# Patient Record
Sex: Female | Born: 1985 | Hispanic: Yes | Marital: Married | Smoking: Never smoker
Health system: Southern US, Community
[De-identification: ages and names within clinical notes are randomized; demographics above are authoritative.]

## PROBLEM LIST (undated history)

## (undated) ENCOUNTER — Inpatient Hospital Stay (HOSPITAL_COMMUNITY): Payer: Self-pay

## (undated) DIAGNOSIS — K8020 Calculus of gallbladder without cholecystitis without obstruction: Secondary | ICD-10-CM

## (undated) DIAGNOSIS — O24419 Gestational diabetes mellitus in pregnancy, unspecified control: Secondary | ICD-10-CM

## (undated) DIAGNOSIS — E7800 Pure hypercholesterolemia, unspecified: Secondary | ICD-10-CM

## (undated) DIAGNOSIS — N289 Disorder of kidney and ureter, unspecified: Secondary | ICD-10-CM

## (undated) DIAGNOSIS — D259 Leiomyoma of uterus, unspecified: Secondary | ICD-10-CM

## (undated) DIAGNOSIS — R7303 Prediabetes: Secondary | ICD-10-CM

## (undated) HISTORY — DX: Calculus of gallbladder without cholecystitis without obstruction: K80.20

## (undated) HISTORY — DX: Gestational diabetes mellitus in pregnancy, unspecified control: O24.419

---
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
## 2005-04-24 ENCOUNTER — Emergency Department (HOSPITAL_COMMUNITY): Admission: EM | Admit: 2005-04-24 | Discharge: 2005-04-24 | Payer: Self-pay | Admitting: Emergency Medicine

## 2005-04-25 ENCOUNTER — Emergency Department (HOSPITAL_COMMUNITY): Admission: EM | Admit: 2005-04-25 | Discharge: 2005-04-25 | Payer: Self-pay | Admitting: Emergency Medicine

## 2005-06-29 ENCOUNTER — Inpatient Hospital Stay (HOSPITAL_COMMUNITY): Admission: AD | Admit: 2005-06-29 | Discharge: 2005-06-29 | Payer: Self-pay | Admitting: *Deleted

## 2005-07-01 ENCOUNTER — Inpatient Hospital Stay (HOSPITAL_COMMUNITY): Admission: AD | Admit: 2005-07-01 | Discharge: 2005-07-01 | Payer: Self-pay | Admitting: Gynecology

## 2005-07-08 ENCOUNTER — Inpatient Hospital Stay (HOSPITAL_COMMUNITY): Admission: AD | Admit: 2005-07-08 | Discharge: 2005-07-08 | Payer: Self-pay | Admitting: *Deleted

## 2006-02-11 DIAGNOSIS — N289 Disorder of kidney and ureter, unspecified: Secondary | ICD-10-CM

## 2006-02-11 HISTORY — DX: Disorder of kidney and ureter, unspecified: N28.9

## 2006-03-04 ENCOUNTER — Encounter (INDEPENDENT_AMBULATORY_CARE_PROVIDER_SITE_OTHER): Payer: Self-pay | Admitting: Internal Medicine

## 2006-03-04 ENCOUNTER — Ambulatory Visit: Payer: Self-pay | Admitting: Internal Medicine

## 2006-03-06 ENCOUNTER — Ambulatory Visit: Payer: Self-pay | Admitting: *Deleted

## 2006-03-25 ENCOUNTER — Ambulatory Visit: Payer: Self-pay | Admitting: Internal Medicine

## 2006-04-08 ENCOUNTER — Ambulatory Visit: Payer: Self-pay | Admitting: Family Medicine

## 2006-05-15 ENCOUNTER — Ambulatory Visit: Payer: Self-pay | Admitting: Internal Medicine

## 2006-07-13 ENCOUNTER — Inpatient Hospital Stay (HOSPITAL_COMMUNITY): Admission: AD | Admit: 2006-07-13 | Discharge: 2006-07-13 | Payer: Self-pay | Admitting: Obstetrics & Gynecology

## 2006-10-29 ENCOUNTER — Encounter (INDEPENDENT_AMBULATORY_CARE_PROVIDER_SITE_OTHER): Payer: Self-pay | Admitting: *Deleted

## 2006-12-26 ENCOUNTER — Inpatient Hospital Stay (HOSPITAL_COMMUNITY): Admission: AD | Admit: 2006-12-26 | Discharge: 2006-12-30 | Payer: Self-pay | Admitting: Obstetrics & Gynecology

## 2007-10-03 IMAGING — US US OB LIMITED
1 series · 14 of 28 positions shown · non-contrast
Comparison: NONE

CLINICAL DATA: Routine anatomic evaluation. 

OBSTETRICAL ULTRASOUND

[Series 1: us obc · 0.21mm/px · 14 of 59 slices shown]
[im 3/59]
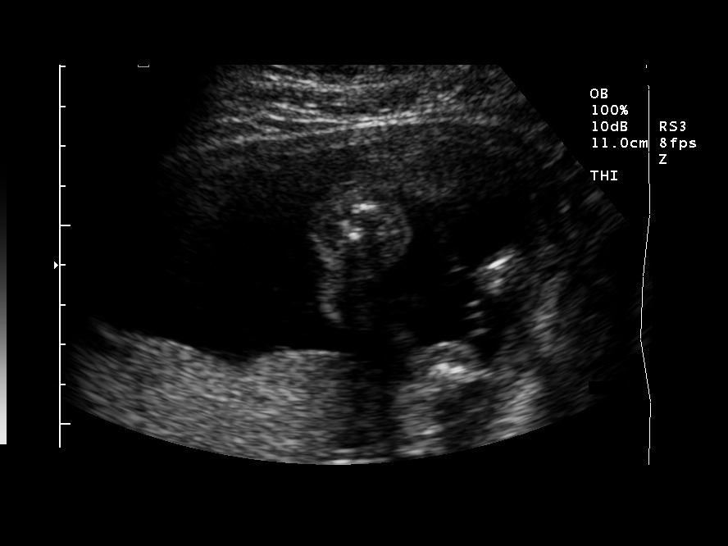
[im 7/59]
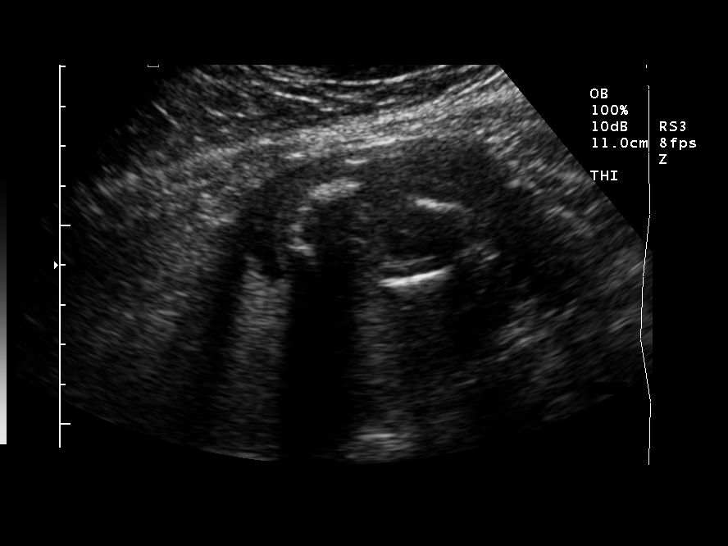
[im 11/59]
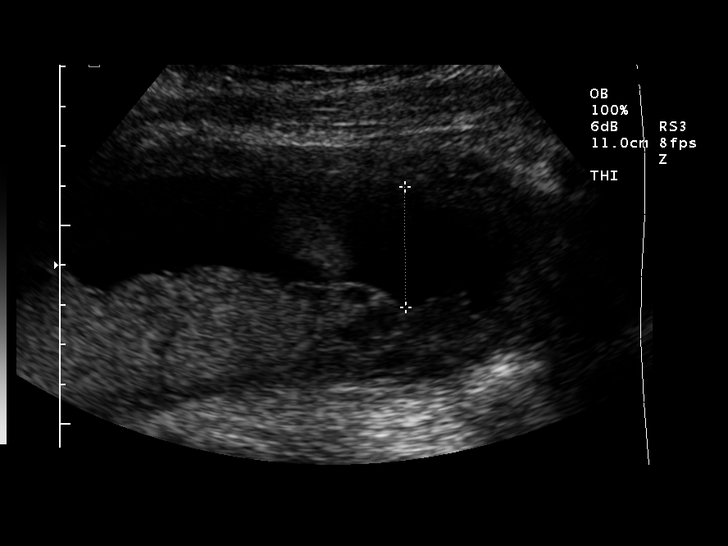
[im 16/59]
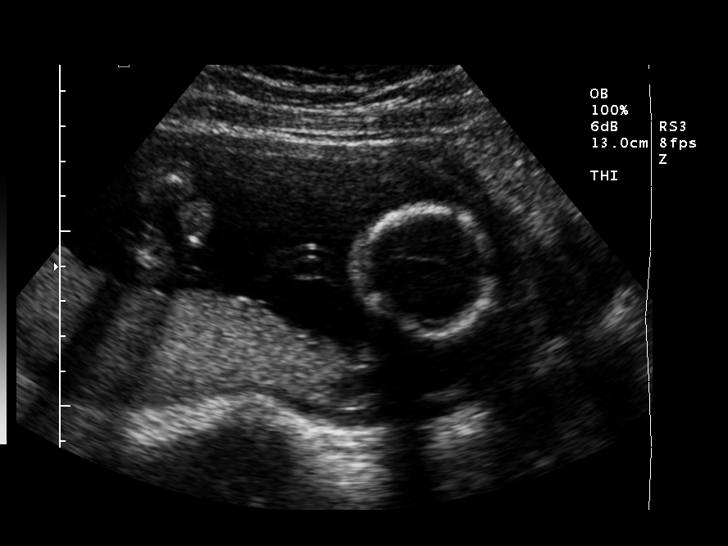
[im 20/59]
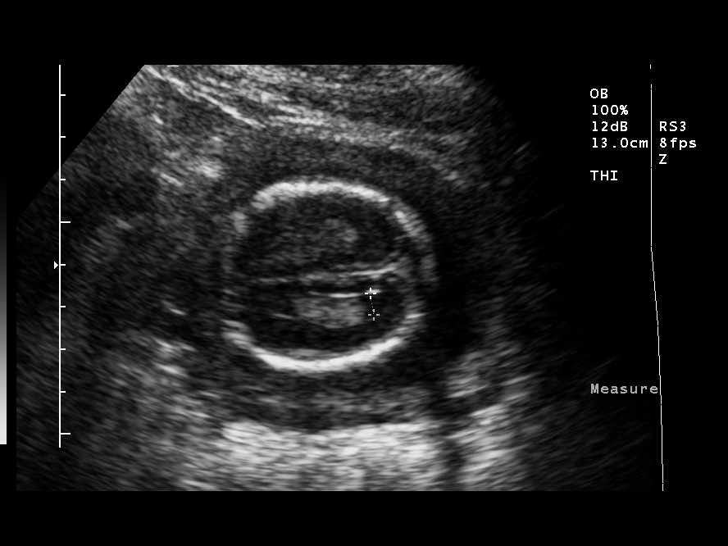
[im 24/59]
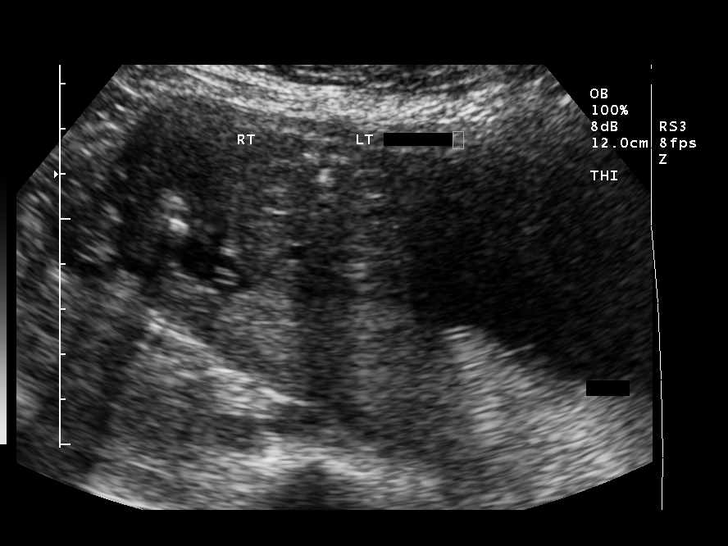
[im 28/59]
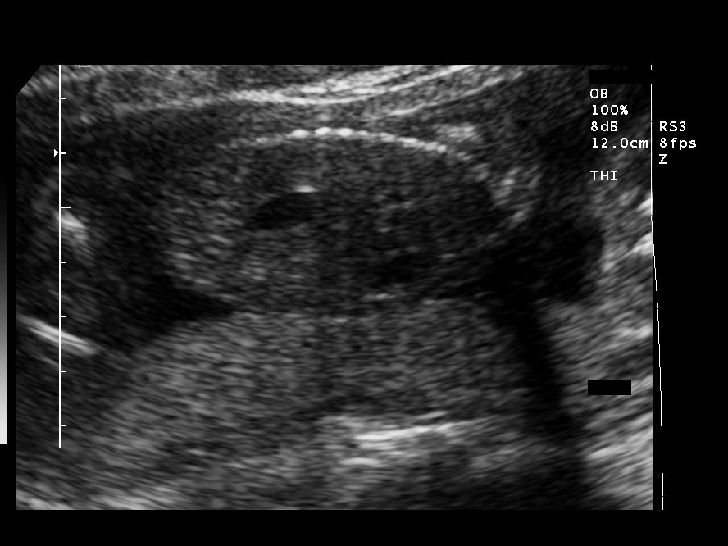
[im 33/59]
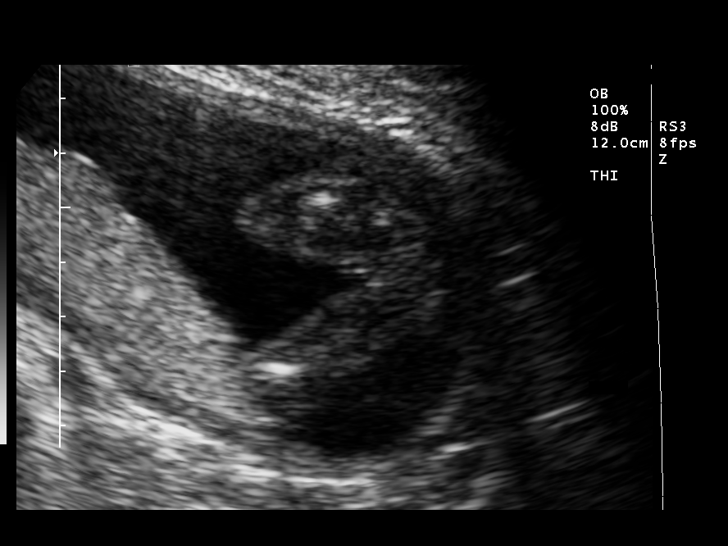
[im 37/59]
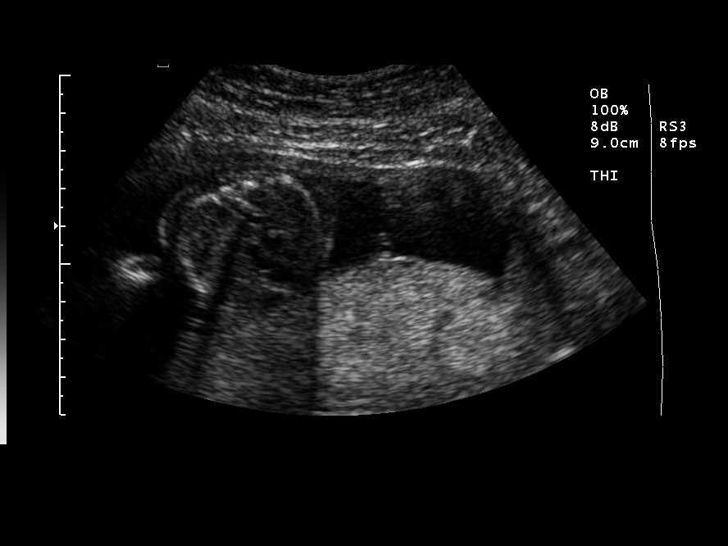
[im 41/59]
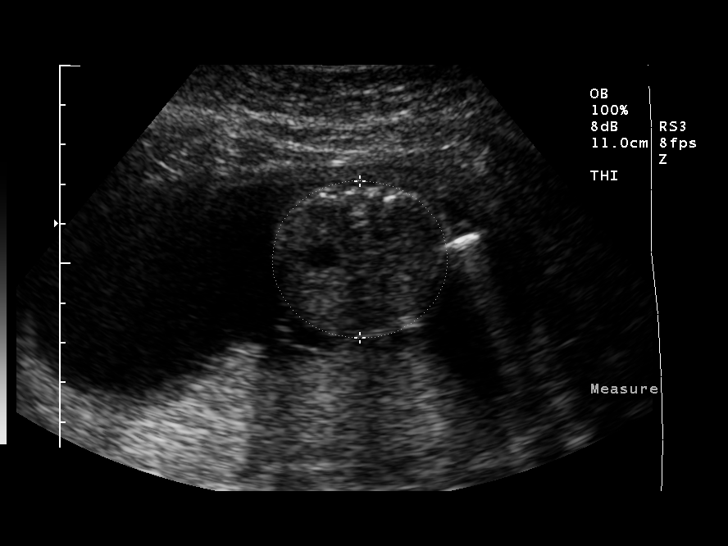
[im 46/59]
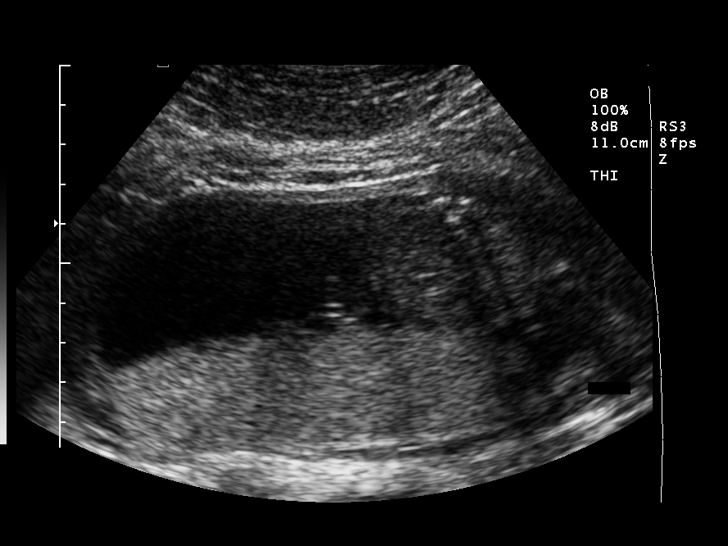
[im 50/59]
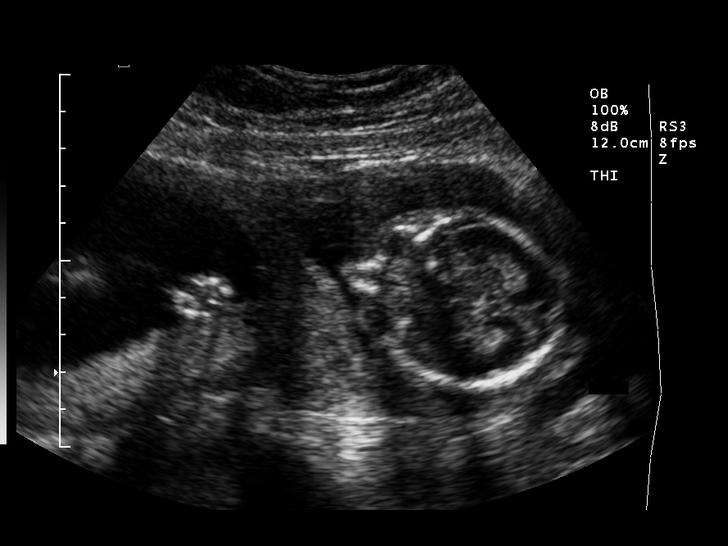
[im 54/59]
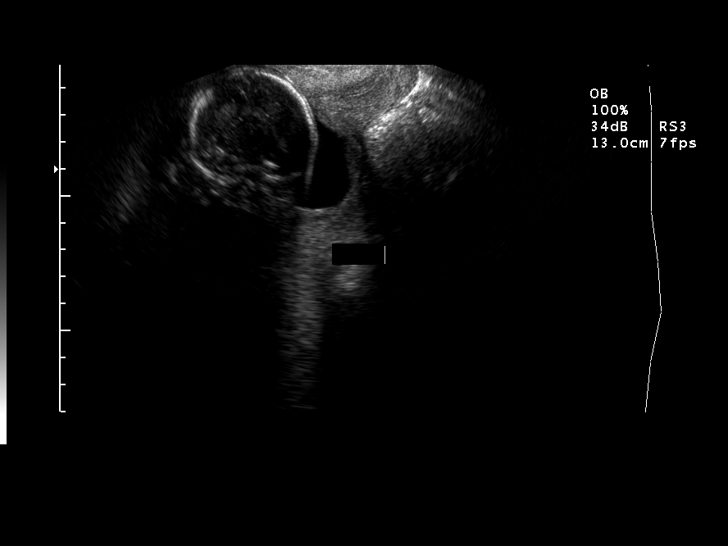
[im 59/59]
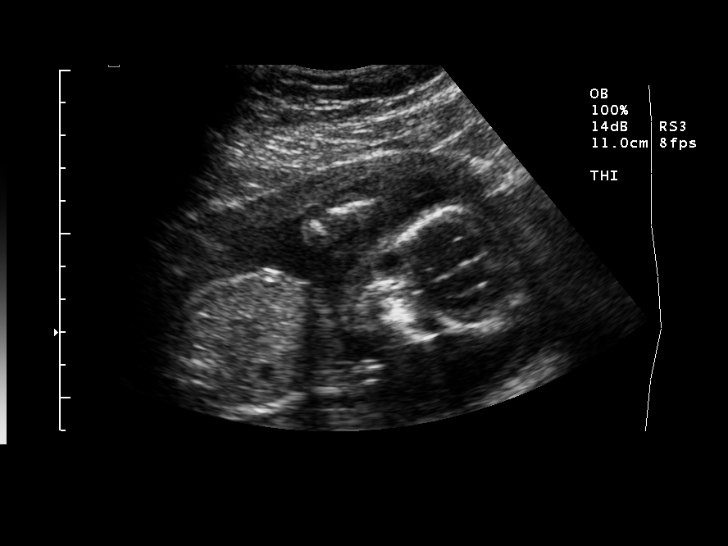

[14 of 28 positions shown; findings below may reference images not displayed]

FINDINGS: There is no previous ultrasound for comparison. There 
is a single intrauterine fetus. Fetal heartbeat is detected with a 
rate of 142 BPM. The fetus is in vertex presentation. The cord 
insertion, spine, kidneys, amniotic fluid, cisterna magna, 
cerebellum, and lateral ventricles are within normal limits. The 
4-chamber heart view appears to be within normal limits. The 
3-vessel cord view was seen. The cervix measures 3.5 cm in length 
and is closed. The placenta is approximately 4.2 cm from the 
internal cervical os. EDC: By US  01/08/07  By LMP  01/04/07 GEST. 
AGE: By US  18 weeks, 6 days   By LMP  19 weeks, 3 days HC:     
162mm = 19 weeks, 0 days AC:     130 mm = 18 weeks, 4 days BPD:    
43  mm = 19 weeks, 1 day FL:        29 mm = 18 weeks, 6 days FETAL 
WEIGHT:  254 gm / 1 pound, 9 ounces
IMPRESSION: Single living intrauterine fetus. Estimated 
gestational age based on the current measurements is 18 weeks, 6 
days. Normal fetal sonograph with no anatomic abnormalities. [place] 
08/13/2006  Trans Date: 08/14/2006 JH  [facility]

## 2007-10-14 ENCOUNTER — Ambulatory Visit: Payer: Self-pay | Admitting: Family Medicine

## 2007-10-14 DIAGNOSIS — K219 Gastro-esophageal reflux disease without esophagitis: Secondary | ICD-10-CM

## 2008-02-12 DIAGNOSIS — O24419 Gestational diabetes mellitus in pregnancy, unspecified control: Secondary | ICD-10-CM

## 2008-02-12 HISTORY — DX: Gestational diabetes mellitus in pregnancy, unspecified control: O24.419

## 2008-08-01 ENCOUNTER — Inpatient Hospital Stay (HOSPITAL_COMMUNITY): Admission: AD | Admit: 2008-08-01 | Discharge: 2008-08-03 | Payer: Self-pay | Admitting: Obstetrics & Gynecology

## 2010-02-11 HISTORY — PX: EYE SURGERY: SHX253

## 2010-05-21 LAB — CBC
Platelets: 192 10*3/uL (ref 150–400)
RBC: 3.37 MIL/uL — ABNORMAL LOW (ref 3.87–5.11)
WBC: 13.6 10*3/uL — ABNORMAL HIGH (ref 4.0–10.5)
WBC: 19.7 10*3/uL — ABNORMAL HIGH (ref 4.0–10.5)

## 2010-05-21 LAB — RPR: RPR Ser Ql: NONREACTIVE

## 2010-05-21 LAB — GLUCOSE, CAPILLARY
Glucose-Capillary: 47 mg/dL — ABNORMAL LOW (ref 70–99)
Glucose-Capillary: 78 mg/dL (ref 70–99)

## 2010-06-26 NOTE — Op Note (Signed)
NAME:  Vanessa Henson, Vanessa Henson       ACCOUNT NO.:  [id number]   MEDICAL RECORD NO.:  [id number]          PATIENT TYPE:  INP   LOCATION:  9110                          [facility]   PHYSICIAN:  Roseanna Rainbow, M.D.DATE OF BIRTH:  13-Sep-1985   DATE OF PROCEDURE:  12/27/2006  DATE OF DISCHARGE:                               OPERATIVE REPORT   PREOPERATIVE DIAGNOSES:  1. Intrauterine pregnancy at term.  2. Arrest of descent.   POSTOPERATIVE DIAGNOSES:  1. Intrauterine pregnancy at term.  2. Arrest of descent.   PROCEDURE:  Primary low uterine flap elliptical cesarean delivery via  Pfannenstiel skin incision.   SURGEON:  Roseanna Rainbow, M.D.   ANESTHESIA:  Epidural.   ESTIMATED BLOOD LOSS:  600 mL.   COMPLICATIONS:  None.   IV FLUID AND URINE OUTPUT:  As per anesthesiology.   PROCEDURE:  The patient was taken to the operating room with an IV  running, a Foley catheter in place and an epidural catheter in situ.  She was placed in the dorsal supine position with a leftward tilt and  prepped and draped in the usual sterile fashion.  After a time out had  been completed, a Pfannenstiel skin incision was then made with a  scalpel and carried down to the underlying fascia.  The fascia was  nicked in the midline.  The fascial incision was then extended  bilaterally with curved Mayo scissors.  The superior aspect of the  fascial incision was then tented up and the underlying rectus muscles  dissected off.  The inferior aspect of the fascial incision was  manipulated in a similar fashion.  The rectus muscles were separated in  the midline.  The parietal peritoneum was tented up and entered sharply.  This incision was then extended superiorly and inferiorly with good  visualization of the bladder.  An Alexis retractor was then placed into  the incision.  The vesicouterine peritoneum was tented up and entered  sharply.  This incision was then extended bilaterally and a  bladder flap  created bluntly.  A bladder blade was then placed.  The lower uterine  segment was then incised in a transverse fashion with the scalpel.  This  incision was then extended bluntly.  With assistance from below with  elevation of the infant's head, the infant's head was delivered  atraumatically.  The oropharynx was suctioned with bulb suction.  The  cord was clamped and cut.  The infant was handed off to the awaiting  neonatologist.  The placenta was then removed.  The intrauterine cavity  was evacuated of any remaining amniotic fluid, clots and debris with a  moistened laparotomy sponge.  The uterine incision was then  reapproximated in a running and a locking fashion using 0 Monocryl.  A  second imbricating layer of the same suture was then placed.  Adequate  hemostasis was noted.  The paracolic gutters were then irrigated.  The  retractor was then removed from the abdomen.  The parietal peritoneum  was reapproximated in a running fashion using 2-0 Vicryl.  The fascia was closed in a running fashion using O Vicryl.  The skin was  closed with staples.  At the close of the procedure, the instrument and  pack counts were said to be correct x2.  A gram of cephazolin had been  given at cord clamp.  The patient was taken to the PACU awake and in  stable condition.      Roseanna Rainbow, M.D.  Electronically Signed     LAJ/MEDQ  D:  12/27/2006  T:  12/29/2006  Job:  [id number]

## 2010-06-26 NOTE — H&P (Signed)
NAME:  Vanessa Henson, Vanessa Henson       ACCOUNT NO.:  [id number]   MEDICAL RECORD NO.:  [id number]          PATIENT TYPE:  INP   LOCATION:  9110                          [facility]   PHYSICIAN:  Roseanna Rainbow, M.D.DATE OF BIRTH:  [birth year]   DATE OF ADMISSION:  12/26/2006  DATE OF DISCHARGE:                              HISTORY & PHYSICAL   CHIEF COMPLAINT:  The patient is a 25 year old gravida 1, para 0 with an  estimated date of confinement of January 09, 2007 with an intrauterine  pregnancy at 38+ weeks complaining of rupture of membranes and uterine  contractions.   HISTORY OF PRESENT ILLNESS:  Please see the above.   ALLERGIES:  QUESTIONABLE ADVERSE REACTION TO BENADRYL.   MEDICATIONS:  Prenatal vitamins, prenatal labs, urine culture and  sensitivity insignificant growth.  Chlamydia probe negative, GC probe  negative, 1 hour glucose tolerance test 121, group B Streptococcus  negative on November 3, hepatitis B surface antigen negative, hematocrit  34, hemoglobin 11.1, human immunodeficiency virus nonreactive, platelets  326,000, blood type O+, antibody screen negative, Rapid Plasma Reagin  nonreactive, rubella immune, sickle cell negative.  An ultrasound on  July 2 at 19 weeks 3 days normal.  No previa.   PAST GYN HISTORY:  Noncontributory.   PAST MEDICAL HISTORY:  No significant history of medical diseases.   PAST SURGICAL HISTORY:  No previous surgery.   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  Does  not give any significant history of alcohol usage, has no significant  smoking history.  Denies illicit drug use.   FAMILY HISTORY:  Adult onset diabetes.   PHYSICAL EXAMINATION:  VITAL SIGNS:  Stable, afebrile.  Fetal heart  tracing reassuring.  Tocodynamometer contractions every 2-4 minutes.   GENERAL:  Uncomfortable.  Sterile vaginal exam per the RN cervix is one  to two and 50%.   ASSESSMENT:  Primigravida at term, early labor.   Fetal heart tracing  consistent with fetal well-being   PLAN:  Admission, expectant management.      Roseanna Rainbow, M.D.  Electronically Signed     LAJ/MEDQ  D:  12/26/2006  T:  12/27/2006  Job:  [id number]

## 2010-06-26 NOTE — H&P (Signed)
NAME:  Vanessa Henson, Vanessa Henson       ACCOUNT NO.:  [id number]   MEDICAL RECORD NO.:  [id number]          PATIENT TYPE:  INP   LOCATION:  9133                          [facility]   PHYSICIAN:  Roseanna Rainbow, M.D.DATE OF BIRTH:  1985-07-22   DATE OF ADMISSION:  08/01/2008  DATE OF DISCHARGE:                              HISTORY & PHYSICAL   CHIEF COMPLAINT:  The patient is a 25 year old, para 1, with an  estimated date of confinement of August 22, 2008, with an intrauterine  pregnancy at 37 plus weeks with history of a previous cesarean delivery  complaining of uterine contractions.   HISTORY OF PRESENT ILLNESS:  Please see the above.   ALLERGIES:  BENADRYL.   MEDICATIONS:  Prenatal vitamins.   OBSTERIC RISK FACTORS:  History of a previous cesarean delivery,  gestational diabetes diet controlled.   PRENATAL LABORATORY DATA:  Urine culture and sensitivity no growth.  Chlamydia probe negative.  Pap smear negative.  GC probe negative.  Three-hour GCT too abnormal values.  One-hour GCT 155.  Hepatitis B  surface antigen negative, hematocrit 35.3, hemoglobin 12.5.  HIV  nonreactive.  Platelets 293,000.  Blood type is O positive, antibody  screen negative, RPR nonreactive.  Sickle cell negative.   OBSTETRICAL HISTORY:  In November 2008, she was delivered of a female at  term, birth weight 7 pounds 9 ounces via cesarean delivery.   PAST GYNECOLOGIC HISTORY:  Noncontributory.   PAST MEDICAL HISTORY:  No significant history of medical diseases.   PAST SURGICAL HISTORY:  Please see the above.   SOCIAL HISTORY:  She lives in [employer].  She is married, living with  her spouse.  Does not give any significant history of alcohol usage.  Has no significant smoking history.  Denies illicit drug use.   FAMILY HISTORY:  Noncontributory.   PHYSICAL EXAMINATION:  VITAL SIGNS:  Stable, afebrile.  Fetal heart  tracing reassuring.  PELVIC:  Tocodynamometer, uterine contractions every  2-4 minutes.  CBG  46.  GENERAL:  Moderate distress.  ABDOMEN:  Gravid.  Sterile vaginal exam per the RN, the cervix is 4 cm  dilated and 100% effaced.   ASSESSMENT:  Primipara at 37 weeks, history of a previous cesarean  delivery, desires vaginal birth after cesarean, history of gestational  diabetes diet controlled.  Early labor, fetal heart tracing consistent  with fetal well-being.  Unknown group B streptococcus status.   PLAN:  Admission, epidural expectant management.      Roseanna Rainbow, M.D.  Electronically Signed     LAJ/MEDQ  D:  08/01/2008  T:  08/01/2008  Job:  [id number]

## 2010-06-26 NOTE — Discharge Summary (Signed)
NAME:  Vanessa Henson, Vanessa Henson       ACCOUNT NO.:  [id number]   MEDICAL RECORD NO.:  [id number]          PATIENT TYPE:  INP   LOCATION:  9110                          [facility]   PHYSICIAN:  Charles A. Clearance Coots, M.D.DATE OF BIRTH:  09/05/1985   DATE OF ADMISSION:  12/26/2006  DATE OF DISCHARGE:  12/30/2006                               DISCHARGE SUMMARY   ADMITTING DIAGNOSES:  1. Term pregnancy.  2. Spontaneous rupture of membranes.  3. Uterine contractions.   DISCHARGE DIAGNOSES:  1. Term pregnancy.  2. Spontaneous rupture of membranes.  3. Uterine contractions.  4. Status post primary low transverse cesarean section on December 27, 2006, viable female, delivered at 10:37.  Apgars of 9 at 1 minute      and 9 at 5 minutes, weight of 3450 grams, length of 49.5 cm.  5. Anemia, clinically stable and did not require transfusion of blood      products.   CONDITION ON DISCHARGE:  Mother discharged home in good condition   REASON FOR ADMISSION:  A 25 year old G1 Hispanic female estimated date  of confinement of January 09, 2007, presented with leaking of fluid and  uterine contractions.   PAST MEDICAL HISTORY:  1. Surgery none.  2. Illnesses none.   MEDICATIONS:  Prenatal vitamins.   ALLERGIES:  NO KNOWN DRUG ALLERGIES.   SOCIAL HISTORY:  Married, living with spouse.  A homemaker.  Negative  history of tobacco, alcohol or recreational drug use.   FAMILY HISTORY:  Positive for diabetes.   GYNECOLOGIC HISTORY:  No major gynecologic illnesses.  No history of  STDs.   PHYSICAL EXAMINATION:  GENERAL:  Well-nourished, well-developed female  in no acute distress.  VITAL SIGNS:  Temperature 98.5, pulse 82, respiratory rate 20, blood  pressure 129/81.  LUNGS:  Clear to auscultation bilaterally.  HEART:  Regular rate and rhythm.  ABDOMEN:  Gravid, nontender.  Cervix 2 cm dilated, 50% effaced, vertex  presentation.   ADMITTING LABORATORY VALUES:  Hemoglobin 12, hematocrit  34.6, white  blood cell count 11,400, platelets 192,000, RPR was nonreactive.   HOSPITAL COURSE:  The patient was admitted and progressed to full  dilatation but with increased caput and the vertex had descended to no  more than +2 station with no further descent.  The patient made no  further descent of the vertex, and a decision was made to proceed with  cesarean section delivery for arrest of descent.  Primary low transverse  cesarean section was performed on December 27, 2006.  There were no  intraoperative complications.  Postoperative course was uncomplicated.  The patient was discharged home on postop day #3 in good condition.   DISCHARGE MEDICATIONS:  1. Tylox and ibuprofen were prescribed for pain.  2. Continue prenatal vitamins.  3. Iron was prescribed for anemia.   DISCHARGE INSTRUCTIONS:  Routine written instructions were given for  discharge after cesarean section.  The patient is to call office for  follow-up appointment in 2 weeks      Charles A. Clearance Coots, M.D.  Electronically Signed     CAH/MEDQ  D:  12/30/2006  T:  12/30/2006  Job:  [id number]

## 2010-11-20 LAB — CBC
HCT: 23 — ABNORMAL LOW
HCT: 34.6 — ABNORMAL LOW
Hemoglobin: 12
MCV: 89.1
MCV: 89
Platelets: 192
RBC: 2.58 — ABNORMAL LOW
RDW: 12.9
WBC: 17.6 — ABNORMAL HIGH

## 2010-11-29 LAB — COMPREHENSIVE METABOLIC PANEL
AST: 36
BUN: 4 — ABNORMAL LOW
CO2: 26
Calcium: 9.1
Chloride: 105
Creatinine, Ser: 0.52
GFR calc Af Amer: >60
GFR calc non Af Amer: >60
Glucose, Bld: 90
Total Bilirubin: 0.5

## 2010-11-29 LAB — URINALYSIS, ROUTINE W REFLEX MICROSCOPIC
Bilirubin Urine: NEGATIVE
Glucose, UA: NEGATIVE
Hgb urine dipstick: NEGATIVE
Specific Gravity, Urine: 1.01
Urobilinogen, UA: 0.2

## 2010-11-29 LAB — CBC
HCT: 33.9 — ABNORMAL LOW
MCHC: 34.5
MCV: 88
RBC: 3.85 — ABNORMAL LOW

## 2011-05-20 ENCOUNTER — Encounter (HOSPITAL_COMMUNITY): Payer: Self-pay | Admitting: Emergency Medicine

## 2011-05-20 ENCOUNTER — Emergency Department (HOSPITAL_COMMUNITY)
Admission: EM | Admit: 2011-05-20 | Discharge: 2011-05-20 | Disposition: A | Discharge status: Home / Self Care | Payer: Self-pay | Attending: Emergency Medicine | Admitting: Emergency Medicine

## 2011-05-20 ENCOUNTER — Emergency Department (HOSPITAL_COMMUNITY): Payer: Self-pay

## 2011-05-20 DIAGNOSIS — D72829 Elevated white blood cell count, unspecified: Secondary | ICD-10-CM | POA: Insufficient documentation

## 2011-05-20 DIAGNOSIS — R1011 Right upper quadrant pain: Secondary | ICD-10-CM | POA: Insufficient documentation

## 2011-05-20 DIAGNOSIS — R197 Diarrhea, unspecified: Secondary | ICD-10-CM | POA: Insufficient documentation

## 2011-05-20 LAB — CBC
HCT: 37 % (ref 36.0–46.0)
Hemoglobin: 12.6 g/dL (ref 12.0–15.0)
MCHC: 34.1 g/dL (ref 30.0–36.0)
MCV: 88.3 fL (ref 78.0–100.0)
RDW: 12.5 % (ref 11.5–15.5)
WBC: 11 10*3/uL — ABNORMAL HIGH (ref 4.0–10.5)

## 2011-05-20 LAB — URINALYSIS, ROUTINE W REFLEX MICROSCOPIC
Nitrite: NEGATIVE
Specific Gravity, Urine: 1.019 (ref 1.005–1.030)
Urobilinogen, UA: 1 mg/dL (ref 0.0–1.0)
pH: 6 (ref 5.0–8.0)

## 2011-05-20 LAB — DIFFERENTIAL
Basophils Absolute: 0 10*3/uL (ref 0.0–0.1)
Eosinophils Relative: 2 % (ref 0–5)
Lymphocytes Relative: 19 % (ref 12–46)
Monocytes Absolute: 0.6 10*3/uL (ref 0.1–1.0)
Monocytes Relative: 5 % (ref 3–12)

## 2011-05-20 LAB — URINE MICROSCOPIC-ADD ON

## 2011-05-20 LAB — POCT I-STAT, CHEM 8
BUN: 8 mg/dL (ref 6–23)
Calcium, Ion: 1.17 mmol/L (ref 1.12–1.32)
Glucose, Bld: 95 mg/dL (ref 70–99)
TCO2: 25 mmol/L (ref 0–100)

## 2011-05-20 LAB — POCT PREGNANCY, URINE: Preg Test, Ur: NEGATIVE

## 2011-05-20 LAB — PREGNANCY, URINE: Preg Test, Ur: NEGATIVE

## 2011-05-20 MED ORDER — ONDANSETRON HCL 4 MG/2ML IJ SOLN
4.0000 mg | Freq: Once | INTRAMUSCULAR | Status: AC
  Administered 2011-05-20: 4 mg via INTRAVENOUS
  Filled 2011-05-20: qty 2

## 2011-05-20 MED ORDER — SODIUM CHLORIDE 0.9 % IV BOLUS (SEPSIS)
1000.0000 mL | Freq: Once | INTRAVENOUS | Status: AC
  Administered 2011-05-20: 1000 mL via INTRAVENOUS

## 2011-05-20 MED ORDER — MORPHINE SULFATE 4 MG/ML IJ SOLN
4.0000 mg | Freq: Once | INTRAMUSCULAR | Status: AC
  Administered 2011-05-20: 4 mg via INTRAVENOUS
  Filled 2011-05-20: qty 1

## 2011-05-20 MED ORDER — PANTOPRAZOLE SODIUM 20 MG PO TBEC: 40.0000 mg | DELAYED_RELEASE_TABLET | Freq: Every day | ORAL | Status: DC

## 2011-05-20 MED ORDER — LOPERAMIDE HCL 2 MG PO CAPS: 2.0000 mg | ORAL_CAPSULE | Freq: Four times a day (QID) | ORAL | Status: AC | PRN

## 2011-05-20 NOTE — ED Provider Notes (Signed)
History     CSN: [id number]  Arrival date & time 05/20/11  1115   First MD Initiated Contact with Patient 05/20/11 1137      Chief Complaint  Patient presents with  . Abdominal Pain    (Consider location/radiation/quality/duration/timing/severity/associated sxs/prior treatment) HPI  26 year old Hispanic female presents with a chief complaints of upper abdominal pain with diarrhea for the past 3 days. Patient is Spanish speaking, she does speak limited [place]. History was obtained mostly through her husband. Per husband, patient has been having right upper quadrant and epigastric abdominal pain for the past 3 days. Onset is gradual, persistent, worsening with eating and improves with not eating. Describe pain is sharp and stabbing. She has nonbloody diarrhea, >5 times/day. She denies any nausea vomiting. She denies fever, chest pain, shortness of breath, or new symptoms, vaginal discharge, or vaginal bleeding. Denies recent abx use or recent travel.  Does have prior history of cholelythiasis without evidence of cholecystitis back in 2008.  She has not tried anything to alleviate her symptoms. Her last menstrual period was in January, states she has a history of irregular period.    No past medical history on file.  Past Surgical History  Procedure Date  . Cesarean section     No family history on file.  History  Substance Use Topics  . Smoking status: Not on file  . Smokeless tobacco: Not on file  . Alcohol Use:     OB History    Grav Para Term Preterm Abortions TAB SAB Ect Mult Living                  Review of Systems  All other systems reviewed and are negative.    Allergies  Diphenhydramine hcl  Home Medications  No current outpatient prescriptions on file.  BP 100/62  Pulse 72  Temp(Src) 98.5 F (36.9 C) (Oral)  Resp 20  SpO2 100%  LMP 02/19/2011  Physical Exam  Nursing note and vitals reviewed. Constitutional: She appears well-developed and  well-nourished. No distress.       Awake, alert, nontoxic appearance  HENT:  Head: Atraumatic.  Eyes: Conjunctivae are normal. Right eye exhibits no discharge. Left eye exhibits no discharge.  Neck: Neck supple.  Cardiovascular: Normal rate and regular rhythm.   Pulmonary/Chest: Effort normal. No respiratory distress. She exhibits no tenderness.  Abdominal: Soft. There is no rebound and no guarding.       Diffuse tenderness to upper abdomen. Negative Murphy sign. No McBurney's point tenderness. No guarding, no rebound tenderness, no evidence of splenohepatomegaly.  No rash, no hernia  Musculoskeletal: She exhibits no tenderness.       ROM appears intact, no obvious focal weakness  Neurological: She is alert.       Mental status and motor strength appears intact  Skin: Skin is warm. No rash noted.  Psychiatric: She has a normal mood and affect.    ED Course  Procedures (including critical care time)   Labs Reviewed  URINALYSIS, ROUTINE W REFLEX MICROSCOPIC  PREGNANCY, URINE   No results found.   No diagnosis found.  Results for orders placed during the hospital encounter of 05/20/11  URINALYSIS, ROUTINE W REFLEX MICROSCOPIC      Component Value Range   Color, Urine YELLOW  YELLOW    APPearance CLEAR  CLEAR    Specific Gravity, Urine 1.019  1.005 - 1.030    pH 6.0  5.0 - 8.0    Glucose, UA NEGATIVE  NEGATIVE (  mg/dL)   Hgb urine dipstick TRACE (*) NEGATIVE    Bilirubin Urine NEGATIVE  NEGATIVE    Ketones, ur NEGATIVE  NEGATIVE (mg/dL)   Protein, ur NEGATIVE  NEGATIVE (mg/dL)   Urobilinogen, UA 1.0  0.0 - 1.0 (mg/dL)   Nitrite NEGATIVE  NEGATIVE    Leukocytes, UA SMALL (*) NEGATIVE   PREGNANCY, URINE      Component Value Range   Preg Test, Ur NEGATIVE  NEGATIVE   CBC      Component Value Range   WBC 11.0 (*) 4.0 - 10.5 (K/uL)   RBC 4.19  3.87 - 5.11 (MIL/uL)   Hemoglobin 12.6  12.0 - 15.0 (g/dL)   HCT 16.1  09.6 - 04.5 (%)   MCV 88.3  78.0 - 100.0 (fL)   MCH 30.1   26.0 - 34.0 (pg)   MCHC 34.1  30.0 - 36.0 (g/dL)   RDW 40.9  81.1 - 91.4 (%)   Platelets 245  150 - 400 (K/uL)  DIFFERENTIAL      Component Value Range   Neutrophils Relative 74  43 - 77 (%)   Neutro Abs 8.1 (*) 1.7 - 7.7 (K/uL)   Lymphocytes Relative 19  12 - 46 (%)   Lymphs Abs 2.1  0.7 - 4.0 (K/uL)   Monocytes Relative 5  3 - 12 (%)   Monocytes Absolute 0.6  0.1 - 1.0 (K/uL)   Eosinophils Relative 2  0 - 5 (%)   Eosinophils Absolute 0.2  0.0 - 0.7 (K/uL)   Basophils Relative 0  0 - 1 (%)   Basophils Absolute 0.0  0.0 - 0.1 (K/uL)  POCT I-STAT, CHEM 8      Component Value Range   Sodium 141  135 - 145 (mEq/L)   Potassium 3.8  3.5 - 5.1 (mEq/L)   Chloride 105  96 - 112 (mEq/L)   BUN 8  6 - 23 (mg/dL)   Creatinine, Ser 7.82  0.50 - 1.10 (mg/dL)   Glucose, Bld 95  70 - 99 (mg/dL)   Calcium, Ion 9.56  2.13 - 1.32 (mmol/L)   TCO2 25  0 - 100 (mmol/L)   Hemoglobin 12.6  12.0 - 15.0 (g/dL)   HCT 08.6  57.8 - 46.9 (%)  URINE MICROSCOPIC-ADD ON      Component Value Range   Squamous Epithelial / LPF FEW (*) RARE    WBC, UA 0-2  <3 (WBC/hpf)   RBC / HPF 0-2  <3 (RBC/hpf)   Bacteria, UA FEW (*) RARE    US Abdomen Complete  05/20/2011  *RADIOLOGY REPORT*  Clinical Data:  Right upper quadrant abdominal pain.  COMPLETE ABDOMINAL ULTRASOUND  Comparison:  03/17/2004 CT.  Findings:        Gallbladder:  normal, without stone, wall thickening, or       pericholecystic fluid.  Sonographic Murphy's sign was not       elicited.              Common bile duct:  normal, at 4 mm        Liver:  normal in echogenicity without focal lesion.        IVC:  within normal limits.        Pancreas:  normal.  Tail partially obscured.        Spleen:  normal in size and echotexture.        Right Kidney:  11.0 cm.  No hydronephrosis.        Left Kidney:  10.8 cm.  No hydronephrosis.  Abdominal aorta:  Non aneurysmal without ascites.  IMPRESSION: No acute process or explanation for right upper quadrant pain.  Original  Report Authenticated By: Consuello Bossier, M.D.      MDM  abd pain with diarrhea and loss of appetite, prior hx of cholelithiasis.  Will obtain Abd [place] for further evaluation.  Work up initiated.  Pain medication and antiemetic give with IVF.  Non surgical abdomen.   2:38 PM No evidence of UTI, pregnancy test negative, normal electrolytes, mildly elevated WBC of 11.0 without left shift.  Abd US reveals no acute changes.  Pt has no CP, cough or SOB concerning for pulmonary etiology.  On reexamination her abd is nontender.  Pt does a systolic BP in the 90s and does complain of dizziness.  IVF given.     4:11 PM BP improves.  Pt has negative orthostatic VS.  Is safe to be discharge.  F/u instruction given.      Fayrene Helper, PA-C 05/20/11 1612

## 2011-05-20 NOTE — ED Notes (Signed)
PA at bedside.

## 2011-05-20 NOTE — ED Notes (Signed)
Patient denies any pain,  Denies nausea, but admits to having dizziness.  Iv bolus ordered and started after talking with erpa

## 2011-05-20 NOTE — Discharge Instructions (Signed)
Take protonix for your abdominal pain.  Take Imodium for your diarrhea.  Return if your symptoms worsen or if you have other concerns.   Dolor abdominal, versin ampliada (Abdominal Pain, Nonspecific) El anlisis podra no mostrar la razn exacta por la que tiene dolor abdominal. Debido a que hay muchas causas distintas de dolor abdominal, se podr necesitar otro control y ms anlisis. Es muy importante el seguimiento para observar los sntomas duraderos (persistentes) o los que [place]. Una causa posible de dolor abdominal en cualquier persona que an tiene su apndice es la apendicitis aguda. La apendicitis es a menudo difcil de diagnosticar. Los anlisis de [place], [place], [place] y tomografa computada no pueden descartar por completo la apendicitis u otra causas de dolor abdominal. A veces, slo los cambios que se producen a Games developer del Microbiologist si el dolor abdominal se debe al apendicitis o a otras causas. Otros problemas potenciales que pueden requerir [place] tambin pueden tomar algn [employer] ser evidentes. Debido a esto, es importante seguir todas las instrucciones de ms abajo. INSTRUCCIONES PARA EL CUIDADO DOMICILIARIO  Descanse todo lo que pueda.   No ingiera alimentos slidos hasta que el dolor desaparezca.   Cuando un adulto o un nio siente dolor: Puede beneficiarlo una dieta basada en agua, t liviano descafeinado, caldo o consom, gelatina, solucin de rehidratacin oral, helados de agua o trocitos de hielo.   Cuando el adulto o el nio no sienten ms dolor: Consuma una dieta liviana (tostadas secas, crackers, jugo de [place] o arroz blanco). Incorpore ms alimentos lentamente, siempre que esto no le cause ningn trastorno. No consuma productos lcteos (incluyendo queso y [place]) ni ingiera alimentos condimentados, grasos, fritos o con gran cantidad de [place].   No consuma alcohol, cafena ni cigarrillos.   Tome sus medicamentos regularmente, excepto  que el profesional le indique lo contrario.   Utilice los medicamentos de venta libre o de prescripcin para Chief Technology Officer, Environmental health practitioner o la [place], segn se lo indique el profesional que lo asiste.   Utilice los medicamentos de venta libre o de prescripcin para Chief Technology Officer, Environmental health practitioner o la [place], segn se lo indique el profesional que lo asiste. No administre aspirina a los nios.  Si el mdico le ha dado fecha para una visita de control, es importante que concurra. No cumplir con este control puede dar como resultado que el dao, el dolor o la discapacidad sean permanentes (crnicos). Si tiene problemas para asistir al control, deber [employer] establecimiento para recibir asesoramiento.  SOLICITE ATENCIN MDICA DE INMEDIATO SI:  Usted o su nio han sufrido dolor por ms de 24 horas.   El dolor [place], cambia de Environmental consultant o se siente diferente.   Usted o su nio tienen una temperatura oral de ms de 102 F (38.9 C) y no puede ser controlada con medicamentos.   Su beb tiene ms de 3 meses y su temperatura rectal es de 102 F (38.9 C) o ms.   Su beb tiene 3 meses o menos y su temperatura rectal es de 100.4 F (38 C) o ms.   Usted o su hijo tienen escalofros.   Continan con vmitos y no pueden retener lquidos.   Observa sangre en el vmito o en la materia fecal.   Las heces son oscuras o [place].   Los movimientos intestinales son frecuentes.   Los movimientos intestinales se detienen (hay una obstruccin) o no pueden eliminarse los gases.   Siente dolor al Beatrix Shipper o lo hace  con frecuencia u observa sangre en la orina.   La piel y la zona blanca de los ojos [place] de color y se tornan amarillos.   Observa que el estmago se hincha o est ms grande.   Sienten mareos o [employer].   Sienten dolor en el pecho o la espalda.  EST SEGURO QUE:   Comprende las instrucciones para el alta mdica.   Controlar su enfermedad.   Solicitar atencin mdica de inmediato segn  las indicaciones.  Document Released: 05/07/2007 Document Revised: 01/17/2011 [facility] Patient Information 2012 [place], [place].

## 2011-05-20 NOTE — ED Notes (Addendum)
Reports Rt side abd pain starting Saturday evening, having diarrhea, no vomiting, no fevers, points to RUQ. Pain worse after eating. Also feels pressure in her back. LMP in January but hx of irregular periods.

## 2011-05-20 NOTE — ED Notes (Signed)
Pt states the pain is much worse after eating

## 2011-05-21 MED ORDER — AEROCHAMBER Z-STAT PLUS/MEDIUM MISC
Status: AC
  Filled 2011-05-21: qty 1

## 2011-05-21 MED ORDER — ALBUTEROL SULFATE (5 MG/ML) 0.5% IN NEBU
INHALATION_SOLUTION | RESPIRATORY_TRACT | Status: AC
  Filled 2011-05-21: qty 1

## 2011-05-21 MED ORDER — ALBUTEROL SULFATE HFA 108 (90 BASE) MCG/ACT IN AERS
INHALATION_SPRAY | RESPIRATORY_TRACT | Status: AC
  Filled 2011-05-21: qty 6.7

## 2011-05-21 MED ORDER — PREDNISOLONE SODIUM PHOSPHATE 15 MG/5ML PO SOLN
ORAL | Status: AC
  Filled 2011-05-21: qty 2

## 2011-05-23 NOTE — ED Provider Notes (Signed)
Medical screening examination/treatment/procedure(s) were performed by non-physician practitioner and as supervising physician I was immediately available for consultation/collaboration.  Cyndra Numbers, MD 05/23/11 1322

## 2011-07-23 ENCOUNTER — Encounter (HOSPITAL_COMMUNITY): Payer: Self-pay | Admitting: *Deleted

## 2011-07-23 ENCOUNTER — Emergency Department (HOSPITAL_COMMUNITY)
Admission: EM | Admit: 2011-07-23 | Discharge: 2011-07-24 | Disposition: A | Discharge status: Home / Self Care | Payer: Self-pay | Attending: Emergency Medicine | Admitting: Emergency Medicine

## 2011-07-23 DIAGNOSIS — M549 Dorsalgia, unspecified: Secondary | ICD-10-CM | POA: Insufficient documentation

## 2011-07-23 DIAGNOSIS — K8020 Calculus of gallbladder without cholecystitis without obstruction: Secondary | ICD-10-CM | POA: Insufficient documentation

## 2011-07-23 DIAGNOSIS — K8050 Calculus of bile duct without cholangitis or cholecystitis without obstruction: Secondary | ICD-10-CM

## 2011-07-23 HISTORY — DX: Disorder of kidney and ureter, unspecified: N28.9

## 2011-07-23 NOTE — ED Notes (Addendum)
Pt c/o sternal pain that began 3 days ago which then began radiating to her back b/w her shoulder blades.  Pt states the pain feels like a cut and like pressure.  When she lays down, the pain increases.  C/ intermittant sob and the last 2 nights she has been unable to sleep d/t the pain.  Denies recent cough.  Pt states that when she was eating Sat if felt like she couldn't swallow.

## 2011-07-24 ENCOUNTER — Emergency Department (HOSPITAL_COMMUNITY): Payer: Self-pay

## 2011-07-24 LAB — URINALYSIS, ROUTINE W REFLEX MICROSCOPIC
Leukocytes, UA: NEGATIVE
Nitrite: NEGATIVE
Specific Gravity, Urine: 1.03 (ref 1.005–1.030)
Urobilinogen, UA: 0.2 mg/dL (ref 0.0–1.0)
pH: 6 (ref 5.0–8.0)

## 2011-07-24 LAB — CBC
Hemoglobin: 12.6 g/dL (ref 12.0–15.0)
Platelets: 304 10*3/uL (ref 150–400)
RBC: 4.21 MIL/uL (ref 3.87–5.11)
WBC: 14.1 10*3/uL — ABNORMAL HIGH (ref 4.0–10.5)

## 2011-07-24 LAB — POCT PREGNANCY, URINE: Preg Test, Ur: NEGATIVE

## 2011-07-24 LAB — LIPASE, BLOOD: Lipase: 23 U/L (ref 11–59)

## 2011-07-24 LAB — COMPREHENSIVE METABOLIC PANEL
Albumin: 4.2 g/dL (ref 3.5–5.2)
Alkaline Phosphatase: 125 U/L — ABNORMAL HIGH (ref 39–117)
BUN: 16 mg/dL (ref 6–23)
Calcium: 9.3 mg/dL (ref 8.4–10.5)
Creatinine, Ser: 0.61 mg/dL (ref 0.50–1.10)
GFR calc Af Amer: >90 mL/min (ref >90)
Glucose, Bld: 77 mg/dL (ref 70–99)
Potassium: 3.5 mEq/L (ref 3.5–5.1)
Total Protein: 8.1 g/dL (ref 6.0–8.3)

## 2011-07-24 LAB — POCT I-STAT TROPONIN I: Troponin i, poc: 0 ng/mL (ref 0.00–0.08)

## 2011-07-24 MED ORDER — GI COCKTAIL ~~LOC~~
30.0000 mL | Freq: Once | ORAL | Status: AC
  Administered 2011-07-24: 30 mL via ORAL
  Filled 2011-07-24: qty 30

## 2011-07-24 MED ORDER — OXYCODONE-ACETAMINOPHEN 5-325 MG PO TABS: 1.0000 | ORAL_TABLET | Freq: Four times a day (QID) | ORAL | Status: AC | PRN

## 2011-07-24 MED ORDER — ONDANSETRON HCL 4 MG/2ML IJ SOLN
4.0000 mg | Freq: Once | INTRAMUSCULAR | Status: AC
  Administered 2011-07-24: 4 mg via INTRAVENOUS
  Filled 2011-07-24: qty 2

## 2011-07-24 MED ORDER — HYDROMORPHONE HCL PF 1 MG/ML IJ SOLN
1.0000 mg | Freq: Once | INTRAMUSCULAR | Status: AC
  Administered 2011-07-24: 1 mg via INTRAVENOUS
  Filled 2011-07-24: qty 1

## 2011-07-24 MED ORDER — SODIUM CHLORIDE 0.9 % IV BOLUS (SEPSIS)
1000.0000 mL | Freq: Once | INTRAVENOUS | Status: AC
  Administered 2011-07-24: 1000 mL via INTRAVENOUS

## 2011-07-24 MED ORDER — OMEPRAZOLE 20 MG PO CPDR: 20.0000 mg | DELAYED_RELEASE_CAPSULE | Freq: Every day | ORAL | Status: DC

## 2011-07-24 NOTE — ED Notes (Signed)
Family notified of results.  Pt continues to experience pain 10/10.

## 2011-07-24 NOTE — ED Provider Notes (Addendum)
History     CSN: [id number]  Arrival date & time 07/23/11  2302   First MD Initiated Contact with Patient 07/24/11 0245      Chief Complaint  Patient presents with  . Chest Pain  . Back Pain    between shoulder blades   HPI  History provided by the patient and friends. Patient is a 26 year old Hispanic female with history of kidney stones and gallstones who presents with complaints of chest pain for the past 3 days. Pain is located in the right middle and upper chest area. Pain does radiate to the back. Pain seems worse with deep breathing. Patient also reports occasional cough but this is not significant. Pain has some similarities to previous gallstone pain but patient states feels higher in the "lung". Pain is worse with eating and drinking. Patient denies any other aggravating or alleviating factors. Patient denies any other associated symptoms. Denies any fever, chills, sweats, nausea vomiting. Patient also denies any dysuria, hematuria, urinary frequency, diarrhea, constipation, vaginal bleeding or vaginal discharge.    Past Medical History  Diagnosis Date  . Renal disorder     kidney stones  . Gall stones     Past Surgical History  Procedure Date  . Cesarean section     No family history on file.  History  Substance Use Topics  . Smoking status: Never Smoker   . Smokeless tobacco: Not on file  . Alcohol Use: No    OB History    Grav Para Term Preterm Abortions TAB SAB Ect Mult Living   3    1     2       Review of Systems  Constitutional: Negative for fever and chills.  Respiratory: Positive for shortness of breath.   Gastrointestinal: Positive for nausea, vomiting and abdominal pain. Negative for diarrhea and constipation.  Genitourinary: Negative for dysuria, frequency, hematuria, flank pain, vaginal bleeding and vaginal discharge.    Allergies  Diphenhydramine hcl  Home Medications   Current Outpatient Rx  Name Route Sig Dispense Refill  . GOODY  HEADACHE PO Oral Take 1 packet by mouth every 6 (six) hours as needed. For migraine      BP 120/78  Pulse 98  Temp 97.9 F (36.6 C) (Oral)  Resp 18  SpO2 98%  Physical Exam  Nursing note and vitals reviewed. Constitutional: She is oriented to person, place, and time. She appears well-developed and well-nourished. No distress.  HENT:  Head: Normocephalic and atraumatic.  Cardiovascular: Normal rate and regular rhythm.   Pulmonary/Chest: Effort normal and breath sounds normal. No respiratory distress. She has no wheezes. She has no rales. She exhibits no tenderness.  Abdominal: Soft. There is tenderness in the right upper quadrant and epigastric area. There is no rebound, no guarding, no CVA tenderness and no tenderness at McBurney's point.  Neurological: She is alert and oriented to person, place, and time.  Skin: Skin is warm and dry. No rash noted.  Psychiatric: She has a normal mood and affect. Her behavior is normal.    ED Course  Procedures   Results for orders placed during the hospital encounter of 07/23/11  URINALYSIS, ROUTINE W REFLEX MICROSCOPIC      Component Value Range   Color, Urine YELLOW  YELLOW   APPearance HAZY (*) CLEAR   Specific Gravity, Urine 1.030  1.005 - 1.030   pH 6.0  5.0 - 8.0   Glucose, UA NEGATIVE  NEGATIVE mg/dL   Hgb urine dipstick NEGATIVE  NEGATIVE   Bilirubin Urine NEGATIVE  NEGATIVE   Ketones, ur NEGATIVE  NEGATIVE mg/dL   Protein, ur NEGATIVE  NEGATIVE mg/dL   Urobilinogen, UA 0.2  0.0 - 1.0 mg/dL   Nitrite NEGATIVE  NEGATIVE   Leukocytes, UA NEGATIVE  NEGATIVE  CBC      Component Value Range   WBC 14.1 (*) 4.0 - 10.5 K/uL   RBC 4.21  3.87 - 5.11 MIL/uL   Hemoglobin 12.6  12.0 - 15.0 g/dL   HCT 16.1  09.6 - 04.5 %   MCV 87.6  78.0 - 100.0 fL   MCH 29.9  26.0 - 34.0 pg   MCHC 34.1  30.0 - 36.0 g/dL   RDW 40.9  81.1 - 91.4 %   Platelets 304  150 - 400 K/uL  POCT PREGNANCY, URINE      Component Value Range   Preg Test, Ur NEGATIVE   NEGATIVE  POCT I-STAT TROPONIN I      Component Value Range   Troponin i, poc 0.00  0.00 - 0.08 ng/mL   Comment 3           LIPASE, BLOOD      Component Value Range   Lipase 23  11 - 59 U/L  COMPREHENSIVE METABOLIC PANEL      Component Value Range   Sodium 141  135 - 145 mEq/L   Potassium 3.5  3.5 - 5.1 mEq/L   Chloride 103  96 - 112 mEq/L   CO2 26  19 - 32 mEq/L   Glucose, Bld 77  70 - 99 mg/dL   BUN 16  6 - 23 mg/dL   Creatinine, Ser 7.82  0.50 - 1.10 mg/dL   Calcium 9.3  8.4 - 95.6 mg/dL   Total Protein 8.1  6.0 - 8.3 g/dL   Albumin 4.2  3.5 - 5.2 g/dL   AST 22  0 - 37 U/L   ALT 21  0 - 35 U/L   Alkaline Phosphatase 125 (*) 39 - 117 U/L   Total Bilirubin 0.1 (*) 0.3 - 1.2 mg/dL   GFR calc non Af Amer >90  >90 mL/min   GFR calc Af Amer >90  >90 mL/min        Dg Chest 2 View  07/24/2011  *RADIOLOGY REPORT*  Clinical Data: Chest pain  CHEST - 2 VIEW  Comparison: 08/02/2008  Findings: Lungs are clear. No pleural effusion or pneumothorax. The cardiomediastinal contours are within normal limits. The visualized bones and soft tissues are without significant appreciable abnormality.  IMPRESSION: No radiographic evidence of acute cardiopulmonary process.  Original Report Authenticated By: Waneta Martins, M.D.     1. Biliary colic   2. Back pain       MDM  2:50 AM patient seen and evaluated. Patient no acute distress. Pt is PERC negative.   Patient feeling much better after medications. I discussed findings of lab tests and x-ray.     Date: 08/21/2011  Rate: 75  Rhythm: normal sinus rhythm  QRS Axis: normal  Intervals: normal  ST/T Wave abnormalities: nonspecific T wave changes  Conduction Disutrbances:none  Narrative Interpretation:   Old EKG Reviewed: none available       Angus Seller, PA 07/25/11 0655  Angus Seller, PA 08/21/11 2203

## 2011-07-24 NOTE — ED Notes (Signed)
Pt states pain increasing

## 2011-07-24 NOTE — Discharge Instructions (Signed)
You were seen and evaluated today for your complaints of back and chest pains. Your lab testing and x-ray of your chest has not shown any signs for concerning or emergent cause your symptoms. It is recommended that you have a recheck of your symptoms in the next 48 hours. He may followup with a primary care provider or return to emergency room. If you develop any worsening symptoms, fever, chills, sweats, persistent nausea vomiting return to emergency room sooner.   Clico biliar (Biliary Colic)  El clico biliar es un dolor continuo o irregular en la zona superior del abdomen. Generalmente se ubica debajo de la zona derecha de la caja torcica. Aparece cuando los clculos biliares interfieren con el flujo normal de la bilis que proviene de la vescula. La bilis es un lquido que interviene en la digestin de las [place]. Se produce en el hgado y se almacena en la vescula. Al comer, La bilis pasa desde la vescula, a travs del conducto cstico y el conducto biliar comn al intestino delgado. All se mezcla con la comida parcialmente digerida. Si un clculo obstruye alguno de esos conductos, se detiene el flujo normal de bilis. Las clulas del conducto biliar se contraen con fuerza para mover el clculo. Esto causa el dolor del clico biliar.  SNTOMAS  El paciente se queja de dolor en la zona superior del abdomen. El dolor puede ser:   En el centro de la zona superior del abdomen, justo por debajo del esternn.   En la zona superior derecha del abdomen, donde se encuentra la vescula biliar y el hgado.   Se expande hacia la espalda, hacia el omplato derecho.   Nuseas y vmitos   El dolor comienza generalmente despus de comer.   El clico biliar aparece como una demanda de bilis por parte del sistema digestivo. La demanda de bilis es mayor luego de ingerir alimentos ricos en grasas. Los sntomas tambin Armed forces operational officer que han estado ayunando e ingieren abruptamente una comida  abundante. La mayora de los episodios de clico biliar mejoran luego de 1 a 5 horas. Despus que se alivia el dolor ms intenso, podr seguir sintiendo un dolor moderado en el abdomen durante un lapso de 24 horas.  DIAGNSTICO Luego de escuchar la descripcin de los sntomas, el mdico realizar un examen fsico. Deber prestar atencin a la zona superior del abdomen. Esta es la zona en la que se encuentra el hgado y la vescula biliar. El mdico podr observar los clculos a travs de una ecografa. Tambin le realizaran un escaneo especializado de la vescula biliar. Le indicarn anlisis de [place], especialmente si tiene fiebre o el dolor persiste. PREVENCIN El clico biliar puede evitarse controlando los factores de riesgo que favorecen los clculos. Algunos de [employer] factores de riesgo como la herencia, el aumento de la edad y Firefighter son aspectos normales de la vida. La obesidad y [place] dieta rica en grasas son factores de riesgo que usted puede modificar a travs de cambios hacia un estilo de vida saludable. Las mujeres que atraviesan la menopausia y que reciben terapia de reemplazo hormonal (estrgenos) tambin tienen ms riesgo de Environmental education officer clicos biliares. TRATAMIENTO  Le prescribirn analgsicos.   Le indicarn una dieta sin grasas.   Si el primer episodio es intenso, o si los clicos aparecen nuevamente, generalmente se indica la ciruga para extirpar la vescula (colecistectoma). Este procedimiento puede realizarse a travs de pequeas incisiones utilizando un instrumento denominado laparoscopio. Muchas veces se requiere una breve  estada en el hospital. Algunas personas reciben el alta el mismo da. Es el tratamiento ms ampliamente utilizado en personas que sufren dolor por clculos biliares. Es efectivo y seguro, no tiene complicaciones en ms del 90% de los [place].   Si la ciruga no puede llevarse a cabo, podrn utilizarse medicamentos para [employer] clculos. Esta medicacin  es cara y puede demorar meses o aos hasta que [place]. Slo podr disolver clculos pequeos.   En algunos casos raros, se combinan estos medicamentos con un procedimiento denominado litotricia por ondas de choque. Este procedimiento [place] ondas de choque cuidadosamente dirigidas a romper los clculos. En muchas personas tratadas con este procedimiento, los clculos vuelven a formarse luego de [employer].  PRONSTICO Si los clculos obstruyen el conducto cstico o conducto biliar comn, usted tiene el riesgo de sufrir episodios repetidos de clicos biliares. Tambin existe un 25% de probabilidades de desarrollar una infeccin de la vescula biliar (colecistitisaguda) o alguna otra complicacin en los siguientes 10 a 20 aos. Si ha sido sometido a [place], progrmela para el momento en que sea conveniente para usted, y para cuando no se encuentre enfermo. INSTRUCCIONES PARA EL CUIDADO DOMICILIARIO  Beba gran cantidad de lquidos claros.   Evite las comidas con mucha grasa o fritas, o cualquier alimento que empeore su dolor.   Tome los medicamentos como se le indic.  SOLICITE ATENCIN MDICA SI:  Le sube la temperatura a ms de 100.5 F (38.1 C).   El dolor empeora con el [place].   Siente nuseas y [employer] comer y beber.   Tiene vmitos.  SOLICITE ATENCIN MDICA DE INMEDIATO SI:  Siente dolor continuo e intenso en el abdomen, que no se alivia con medicamentos.   Siente nuseas y vmitos que no mejoran con medicamentos.   Tiene sntomas de clico biliar y comienza a Lodema Pilot y escalofros. Esto puede ser un indicio de que ha desarrollado colecistitis. Comunquese con su mdico inmediatamente.   Su piel o la parte blanca del ojo se vuelven amarillas (ictericia).  Document Released: 05/07/2007 Document Revised: 01/17/2011 [facility] Patient Information 2012 [place], [place].    Dolor de Chief Executive Officer (Dolor sin causa conocida) (Pain of Unknown Etiology  [Pain Without a Known Cause]) Usted ha concurrido a Youth worker con un profesional a causa de Herbalist. El dolor puede ocurrir en cualquier parte del cuerpo. Generalmente no tiene [employer]. Si las pruebas de laboratorio (sangre y [place]), las radiografas u otros estudios son normales, el profesional que lo asiste podr tratarlo sin Geologist, engineering causa del Engineer, mining. Un ejemplo es la cefalea. La mayor parte de los dolores de [place] se diagnostican a travs de los antecedentes. Esto significa que el profesional que lo asiste le har preguntas con respecto a la cefalea. El profesional determina un tratamiento basndose en sus respuestas. Generalmente las ArvinMeritor se realizan debido a las cefaleas son normales. Con frecuencia no se realizan exmenes, a menos que no haya respuesta a los medicamentos. En el da de [employer] administrarn medicamentos para que se sienta mejor, sin considerar la ubicacin del Engineer, mining. Si no puede hallarse una causa fsica para Chief Technology Officer, as como lleg en forma repentina, en la mayor parte de los Pharmacologist.  Si siente dolor y no hay motivos para sentirlo, es importante que realice un seguimiento con el profesional que lo asiste. Si el dolor empeora, o no se va, podr ser necesario repetir las pruebas y buscar ms [employer] Life  posibles causas.  Utilice los medicamentos de venta libre o de prescripcin para Chief Technology Officer, Environmental health practitioner o la [place], segn se lo indique el profesional que lo asiste.   Para proteger su privacidad, no se entregarn los [employer] pruebas por telfono. Asegrese de conseguirlos. Consulte el modo en que podr obtenerlos si no se lo han informado. Es su responsabilidad contar con los Lub[employer] Corporation.   Podr seguir con todas las actividades a menos que stas le ocasionen ms Merck & Co. Cuando el dolor [place], es importante que reanude gradualmente todas las actividades. Retorne a las actividades o los ejercicios  comenzando lentamente e incrementando gradualmente la intensidad y la duracin Durante los perodos de dolor intenso, el reposo en cama puede ser beneficioso. Recustese o sintese en la posicin que le sea ms cmoda.   El hielo es muy eficaz en los episodios agudos (repentinos). Use una bolsa plstica grande llena de hielo y envuelta en una toalla. Esto Engineer, materials.   Consulte al profesional que le asiste si Principal Financial. l podr ayudarlo o derivarlo para realizar ejercicios o fisioterapia, si fuera necesario.  Si le han administrado medicamentos, no conduzca, no opere maquinarias ni Diplomatic Services operational officer, y tampoco firme documentos legales por 24 horas. No beba alcohol, no tome pldoras para dormir ni otros medicamentos que puedan interferir con Scientist, research (medical). Consulte inmediatamente con el profesionalque lo asiste si siente que el dolor empeora y no se alivia con los medicamentos. Document Released: 11/07/2004 Document Revised: 01/17/2011 [facility] Patient Information 2012 [place], [place].     RESOURCE GUIDE  Chronic Pain Problems: Contact [facility]  [phone] Patients need to be referred by their primary care doctor.  Insufficient Money for Medicine: Contact United Way:  call "211" or [facility] [phone].  No Primary Care Doctor: - Call [facility] Connect  [phone] - can help you locate a primary care doctor that  accepts your insurance, provides certain services, etc. - Physician Referral [phone]  Agencies that provide inexpensive medical care: - [facility]  [phone] - [facility]  [phone] - [facility]  [phone] [facility]  [phone] - Planned Parenthood  [phone] [facility]  [phone]  Medicaid-accepting [facility] Providers: - [facility]- [address]  [phone], Mon-Fri 9am-7pm, Sat  9am-1pm - [facility]- [address] [place], [address] [place]  [phone] - [facility]- [address] [place]  [phone] [facility]- [address]  [phone] - Renaye Rakers- [address] [place], [address], [phone]  Only accepts [place] patients after they have their name  applied to their card  Self Pay (no insurance) in [place]: - Sickle Cell Patients: Dr Willey Blade, [facility]  [address] [place], [phone] - [facility]- [address] [place]  [phone]       [facility] [place]- [address]       -     [facility]- see information above (Speak to Citigroup if you do not have insurance)       -  [facility]- [address] [place], [phone]       -  [facility]- [address],  [phone]       -  [facility]- [address], [phone]       -  Dr Julio Sicks-  [address], [place], [phone]       -  [facility]- [address] [phone]       -  [facility]- [address], [phone], also [address], [phone]       -    [facility]- [address] [place], [phone], 1st & 3rd Saturday   every month, 10am-1pm  1) Find a Doctor and Pay Out of Pocket Although you won't have to find out who is covered by your insurance plan, it is a good idea to ask around and get recommendations. You will then need to call the office and see if the doctor you have chosen will accept you as a new patient and what types of options they offer for patients who are self-pay. Some doctors offer discounts or will set up payment plans for their patients who do not have insurance, but you will need to ask so you aren't surprised when you get to your appointment.  2) Contact Your Local Health Department Not all health departments have doctors that can see patients for  sick visits, but many do, so it is worth a call to see if yours does. If you don't know where your local health department is, you can check in your phone book. The CDC also has a tool to help you locate your state's health department, and many state websites also have listings of all of their local health departments.  3) Find a Walk-in Clinic If your illness is not likely to be very severe or complicated, you may want to try a walk in clinic. These are popping up all over the country in pharmacies, drugstores, and shopping centers. They're usually staffed by nurse practitioners or physician assistants that have been trained to treat common illnesses and complaints. They're usually fairly quick and inexpensive. However, if you have serious medical issues or chronic medical problems, these are probably not your best option  STD Testing - [facility] Department of [facility], STD Clinic, [address], [place], phone [phone] or [phone].  Monday - Friday, call for an appointment. [facility] of [employer], STD Clinic, [address], [place], phone [phone] or [phone].  Monday - Friday, call for an appointment.  Abuse/Neglect: [facility] Child Abuse Hotline [phone] [facility] Child Abuse Hotline 253-318-4781 (After Hours)  Emergency Shelter:  [facility] 763-388-7038  Maternity Homes: - [facility] 2793843046 - [facility] [phone]  MRSA Hotline #:   (301)480-7396  [facility] Resources  [facility]  United Way [facility] Dept. [address]                 [address]         [address] [place] [address]  [address]  [facility] Phone:  [phone]                                  Phone:  [phone]                   Phone:   [phone]  [facility], [phone] - [phone]       -     [facility] in [place], [address]                                  [phone], [facility] Child Abuse Hotline 306-558-5571 or 641-591-8752 (After Hours)   Behavioral Health Services  Substance Abuse Resources: - Alcohol and Drug Services  [phone] - [facility] [phone] - [facility] [phone] Floydene Flock [phone] - Residential & Outpatient Substance Abuse Program  [phone]  Psychological Services: [facility]  [phone] [facility] Services  [phone] - [facility], [phone] [place]. [address] [place], ACCESS LINE: [phone] or [phone], EntrepreneurLoan.[email or web address].za  Dental Assistance  If unable to pay or uninsured, contact:  [facility] or [facility] to become qualified for the adult dental clinic.  Patients with Medicaid: [facility] [phone] [address], [phone] [address], [phone]  If unable to pay, or uninsured, contact [facility] [phone]) or [facility] Department [phone] in [place], [phone] in [facility] to become qualified for the adult dental clinic  Other Low-Cost Community Dental Services: - [facility]- [address] [place], [place], [place], [address], [phone], [address]. [phone], 2nd and 4th Thursday of the month at 6:30am.  10 clients each day by appointment, can sometimes see walk-in patients if someone does not show for an appointment. [facility]- [address] [place], [place], [address], [phone] - [facility]- [address], [place], [place], [address], [phone] - [facility]- [phone] [facility]- [phone] [facility] Department- [phone]

## 2011-07-26 NOTE — ED Provider Notes (Signed)
Medical screening examination/treatment/procedure(s) were performed by non-physician practitioner and as supervising physician I was immediately available for consultation/collaboration.  Olivia Mackie, MD 07/26/11 [phone]

## 2011-07-30 ENCOUNTER — Other Ambulatory Visit (HOSPITAL_COMMUNITY): Payer: Self-pay | Admitting: Gastroenterology

## 2011-07-30 DIAGNOSIS — R109 Unspecified abdominal pain: Secondary | ICD-10-CM

## 2011-07-30 DIAGNOSIS — K8020 Calculus of gallbladder without cholecystitis without obstruction: Secondary | ICD-10-CM

## 2011-08-05 ENCOUNTER — Ambulatory Visit (HOSPITAL_COMMUNITY)
Admission: RE | Admit: 2011-08-05 | Discharge: 2011-08-05 | Disposition: A | Discharge status: Home / Self Care | Payer: Self-pay | Source: Ambulatory Visit | Attending: Gastroenterology | Admitting: Gastroenterology

## 2011-08-05 DIAGNOSIS — R109 Unspecified abdominal pain: Secondary | ICD-10-CM | POA: Insufficient documentation

## 2011-08-05 DIAGNOSIS — K8020 Calculus of gallbladder without cholecystitis without obstruction: Secondary | ICD-10-CM

## 2011-08-27 NOTE — ED Provider Notes (Signed)
Medical screening examination/treatment/procedure(s) were performed by non-physician practitioner and as supervising physician I was immediately available for consultation/collaboration.  Olivia Mackie, MD 08/27/11 2104

## 2011-09-26 ENCOUNTER — Encounter (HOSPITAL_COMMUNITY): Payer: Self-pay | Admitting: Emergency Medicine

## 2011-09-26 DIAGNOSIS — N946 Dysmenorrhea, unspecified: Secondary | ICD-10-CM | POA: Insufficient documentation

## 2011-09-26 LAB — CBC WITH DIFFERENTIAL/PLATELET
Basophils Absolute: 0.1 10*3/uL (ref 0.0–0.1)
Eosinophils Relative: 5 % (ref 0–5)
HCT: 38.3 % (ref 36.0–46.0)
Lymphocytes Relative: 26 % (ref 12–46)
Lymphs Abs: 3.5 10*3/uL (ref 0.7–4.0)
MCV: 87.6 fL (ref 78.0–100.0)
Monocytes Absolute: 0.6 10*3/uL (ref 0.1–1.0)
Neutro Abs: 8.6 10*3/uL — ABNORMAL HIGH (ref 1.7–7.7)
RBC: 4.37 MIL/uL (ref 3.87–5.11)
RDW: 12.3 % (ref 11.5–15.5)
WBC: 13.5 10*3/uL — ABNORMAL HIGH (ref 4.0–10.5)

## 2011-09-26 NOTE — ED Notes (Signed)
PT. REPORTS VAGINAL BLEEDING ONSET 4 DAYS AGO , UNSURE ON LMP ( ? SEVERAL MONTHS AGO) , UPPER ABDOMINAL CRAMPING , DENIES FEVER OR CHILLS.

## 2011-09-27 ENCOUNTER — Emergency Department (HOSPITAL_COMMUNITY)
Admission: EM | Admit: 2011-09-27 | Discharge: 2011-09-27 | Disposition: A | Discharge status: Home / Self Care | Payer: Self-pay | Attending: Emergency Medicine | Admitting: Emergency Medicine

## 2011-09-27 DIAGNOSIS — N946 Dysmenorrhea, unspecified: Secondary | ICD-10-CM

## 2011-09-27 LAB — BASIC METABOLIC PANEL
CO2: 29 mEq/L (ref 19–32)
Chloride: 100 mEq/L (ref 96–112)
Creatinine, Ser: 0.53 mg/dL (ref 0.50–1.10)
GFR calc Af Amer: >90 mL/min (ref >90)
Potassium: 4 mEq/L (ref 3.5–5.1)
Sodium: 139 mEq/L (ref 135–145)

## 2011-09-27 LAB — URINALYSIS, ROUTINE W REFLEX MICROSCOPIC
Glucose, UA: NEGATIVE mg/dL
Ketones, ur: 15 mg/dL — AB
Nitrite: NEGATIVE
Specific Gravity, Urine: 1.024 (ref 1.005–1.030)
pH: 6.5 (ref 5.0–8.0)

## 2011-09-27 LAB — WET PREP, GENITAL
Clue Cells Wet Prep HPF POC: NONE SEEN
WBC, Wet Prep HPF POC: NONE SEEN
Yeast Wet Prep HPF POC: NONE SEEN

## 2011-09-27 LAB — POCT PREGNANCY, URINE: Preg Test, Ur: NEGATIVE

## 2011-09-27 LAB — URINE MICROSCOPIC-ADD ON

## 2011-09-27 MED ORDER — IBUPROFEN 800 MG PO TABS: 800.0000 mg | ORAL_TABLET | Freq: Three times a day (TID) | ORAL | Status: AC

## 2011-09-27 NOTE — ED Notes (Signed)
Pelvic cart at the bedside 

## 2011-09-28 LAB — GC/CHLAMYDIA PROBE AMP, GENITAL: GC Probe Amp, Genital: NEGATIVE

## 2011-09-30 NOTE — ED Provider Notes (Signed)
History     CSN: [id number]  Arrival date & time 09/26/11  2318   First MD Initiated Contact with Patient 09/27/11 0059      Chief Complaint  Patient presents with  . Vaginal Bleeding    (Consider location/radiation/quality/duration/timing/severity/associated sxs/prior treatment) HPI Comments: Pt has hx irregular periods and has had times in the past where she has gone months between periods. Reports last regular period was in Feb or March. She does not regularly use any contraception. Began bleeding 4 days ago, about the normal amount of bleeding with a period. Denies any vaginal discharge prior to this. Has had some cramping sensation to the lower ribs/upper abd with this but denies any lower abd pain. No fever, chills, urinary sx, changes in BMs, change in appetite. She is unsure if she may be pregnant.  Patient is a 26 y.o. female presenting with vaginal bleeding. The history is provided by the patient.  Vaginal Bleeding This is a new problem. Episode onset: 4 days ago. The problem occurs constantly. The problem has been unchanged. Associated symptoms include abdominal pain (cramping to upper abd only x several days). Pertinent negatives include no change in bowel habit, fever, headaches, myalgias, nausea, urinary symptoms, vomiting or weakness. Nothing aggravates the symptoms. She has tried nothing for the symptoms.    Past Medical History  Diagnosis Date  . Renal disorder     kidney stones  . Gall stones     Past Surgical History  Procedure Date  . Cesarean section     No family history on file.  History  Substance Use Topics  . Smoking status: Never Smoker   . Smokeless tobacco: Not on file  . Alcohol Use: No    OB History    Grav Para Term Preterm Abortions TAB SAB Ect Mult Living   3    1     2       Review of Systems  Constitutional: Negative for fever.  Gastrointestinal: Positive for abdominal pain (cramping to upper abd only x several days). Negative for  nausea, vomiting and change in bowel habit.  Genitourinary: Positive for vaginal bleeding.  Musculoskeletal: Negative for myalgias.  Neurological: Negative for weakness and headaches.  All other systems reviewed and are negative.    Allergies  Diphenhydramine hcl  Home Medications   Current Outpatient Rx  Name Route Sig Dispense Refill  . IBUPROFEN 800 MG PO TABS Oral Take 1 tablet (800 mg total) by mouth 3 (three) times daily. 21 tablet 0    BP 111/65  Pulse 78  Temp 98.4 F (36.9 C) (Oral)  Resp 16  SpO2 100%  Physical Exam  Nursing note and vitals reviewed. Constitutional: She appears well-developed and well-nourished. No distress.  HENT:  Head: Normocephalic and atraumatic.  Mouth/Throat: Oropharynx is clear and moist.  Eyes:       Normal appearance  Neck: Normal range of motion.  Cardiovascular: Normal rate, regular rhythm and normal heart sounds.   Pulmonary/Chest: Effort normal and breath sounds normal. She exhibits no tenderness.  Abdominal: Soft. Bowel sounds are normal. There is no tenderness. There is no rebound and no guarding.  Genitourinary:       Chaperone present during exam Dark blood present in vaginal vault Cervical os closed, no CMT No masses or tenderness on bimanual  Musculoskeletal: Normal range of motion.  Neurological: She is alert.  Skin: Skin is warm and dry. She is not diaphoretic.  Psychiatric: She has a normal mood and affect.  ED Course  Procedures (including critical care time)  Labs Reviewed  URINALYSIS, ROUTINE W REFLEX MICROSCOPIC - Abnormal; Notable for the following:    Color, Urine RED (*)  BIOCHEMICALS MAY BE AFFECTED BY COLOR   APPearance CLOUDY (*)     Hgb urine dipstick LARGE (*)     Ketones, ur 15 (*)     Protein, ur 30 (*)     Leukocytes, UA SMALL (*)     All other components within normal limits  BASIC METABOLIC PANEL - Abnormal; Notable for the following:    Glucose, Bld 125 (*)     All other components  within normal limits  CBC WITH DIFFERENTIAL - Abnormal; Notable for the following:    WBC 13.5 (*)     Neutro Abs 8.6 (*)     All other components within normal limits  URINE MICROSCOPIC-ADD ON - Abnormal; Notable for the following:    Bacteria, UA FEW (*)     All other components within normal limits  WET PREP, GENITAL  GC/CHLAMYDIA PROBE AMP, GENITAL  POCT PREGNANCY, URINE  LAB REPORT - SCANNED   No results found.   1. Dysmenorrhea       MDM  Pt with negative urine pregnancy test. Has had vaginal bleeding x 4 days. Hx irregular menses in past. Wet prep nl. Pt instructed to f/u with gyn to discuss need for possible hormonal regulation of menses. Reasons to return to ED discussed.        Grant Fontana, PA-C 09/30/11 2224

## 2011-10-01 NOTE — ED Provider Notes (Signed)
Medical screening examination/treatment/procedure(s) were performed by non-physician practitioner and as supervising physician I was immediately available for consultation/collaboration.   Suzi Roots, MD 10/01/11 1114

## 2011-11-21 ENCOUNTER — Emergency Department (HOSPITAL_COMMUNITY): Payer: Self-pay

## 2011-11-21 ENCOUNTER — Encounter (HOSPITAL_COMMUNITY): Payer: Self-pay | Admitting: Emergency Medicine

## 2011-11-21 ENCOUNTER — Emergency Department (HOSPITAL_COMMUNITY)
Admission: EM | Admit: 2011-11-21 | Discharge: 2011-11-21 | Disposition: A | Discharge status: Home / Self Care | Payer: Self-pay | Attending: Emergency Medicine | Admitting: Emergency Medicine

## 2011-11-21 DIAGNOSIS — M549 Dorsalgia, unspecified: Secondary | ICD-10-CM | POA: Insufficient documentation

## 2011-11-21 DIAGNOSIS — K279 Peptic ulcer, site unspecified, unspecified as acute or chronic, without hemorrhage or perforation: Secondary | ICD-10-CM | POA: Insufficient documentation

## 2011-11-21 DIAGNOSIS — R109 Unspecified abdominal pain: Secondary | ICD-10-CM | POA: Insufficient documentation

## 2011-11-21 DIAGNOSIS — R12 Heartburn: Secondary | ICD-10-CM | POA: Insufficient documentation

## 2011-11-21 LAB — URINALYSIS, ROUTINE W REFLEX MICROSCOPIC
Nitrite: NEGATIVE
Specific Gravity, Urine: 1.02 (ref 1.005–1.030)
Urobilinogen, UA: 0.2 mg/dL (ref 0.0–1.0)
pH: 6 (ref 5.0–8.0)

## 2011-11-21 LAB — CBC WITH DIFFERENTIAL/PLATELET
Eosinophils Absolute: 1.3 10*3/uL — ABNORMAL HIGH (ref 0.0–0.7)
Hemoglobin: 12.9 g/dL (ref 12.0–15.0)
Lymphocytes Relative: 23 % (ref 12–46)
Lymphs Abs: 2.8 10*3/uL (ref 0.7–4.0)
Neutro Abs: 7.5 10*3/uL (ref 1.7–7.7)
Neutrophils Relative %: 62 % (ref 43–77)
Platelets: 272 10*3/uL (ref 150–400)
RBC: 4.35 MIL/uL (ref 3.87–5.11)
WBC: 12.2 10*3/uL — ABNORMAL HIGH (ref 4.0–10.5)

## 2011-11-21 LAB — BASIC METABOLIC PANEL
CO2: 26 mEq/L (ref 19–32)
Chloride: 104 mEq/L (ref 96–112)
GFR calc non Af Amer: >90 mL/min (ref >90)
Glucose, Bld: 85 mg/dL (ref 70–99)
Potassium: 3.8 mEq/L (ref 3.5–5.1)
Sodium: 140 mEq/L (ref 135–145)

## 2011-11-21 LAB — PREGNANCY, URINE: Preg Test, Ur: NEGATIVE

## 2011-11-21 LAB — URINE MICROSCOPIC-ADD ON

## 2011-11-21 LAB — COMPREHENSIVE METABOLIC PANEL
ALT: 17 U/L (ref 0–35)
Calcium: 9.3 mg/dL (ref 8.4–10.5)
GFR calc Af Amer: >90 mL/min (ref >90)
Glucose, Bld: 80 mg/dL (ref 70–99)
Sodium: 141 mEq/L (ref 135–145)
Total Protein: 7.5 g/dL (ref 6.0–8.3)

## 2011-11-21 MED ORDER — IOHEXOL 300 MG/ML  SOLN
80.0000 mL | Freq: Once | INTRAMUSCULAR | Status: AC | PRN
  Administered 2011-11-21: 80 mL via INTRAVENOUS

## 2011-11-21 MED ORDER — OMEPRAZOLE 20 MG PO CPDR: 20.0000 mg | DELAYED_RELEASE_CAPSULE | Freq: Every day | ORAL | Status: DC

## 2011-11-21 MED ORDER — GI COCKTAIL ~~LOC~~
30.0000 mL | Freq: Once | ORAL | Status: AC
  Administered 2011-11-21: 30 mL via ORAL
  Filled 2011-11-21: qty 30

## 2011-11-21 MED ORDER — IOHEXOL 300 MG/ML  SOLN
20.0000 mL | INTRAMUSCULAR | Status: AC
  Administered 2011-11-21: 20 mL via ORAL

## 2011-11-21 NOTE — ED Provider Notes (Signed)
5:42 PM Patient awaiting completion of diagnostic testing in the evaluation of abdominal pain.  [place] results reveals enlarging hepatic lesion with recommendation for contrasted CT to further evaluate.  Family and patient informed of additional testing.  8:31 PM Radiology results reviewed, discussed with Dr. Bebe Shaggy and shared with patient and family.  No acute hepatic or other abdominal process.  Will discharge home with PPI as recommended by Dr. Ignacia Palma.  Jimmye Norman, NP 11/21/11 [phone]

## 2011-11-21 NOTE — ED Notes (Signed)
Patient was explained discharge instructions in Spanish and she did not have any questions.  Patient is alert and orientedx4.  She is being transported home by her husband.

## 2011-11-21 NOTE — ED Provider Notes (Signed)
History     CSN: [id number]  Arrival date & time 11/21/11  1221   First MD Initiated Contact with Patient 11/21/11 1356      Chief Complaint  Patient presents with  . Abdominal Pain    (Consider location/radiation/quality/duration/timing/severity/associated sxs/prior treatment) HPI Comments: Patient has a 2 day history of abdominal pain. She feels the pain in her upper abdomen, and it's a burning feeling. There is no vomiting or diarrhea. She's had no painful urination. Her last period ended on Sunday, 4 days ago, and was unusually prolonged, 2 weeks, but otherwise was unremarkable. Her only prior surgery has been cesarean section, 4 years ago. He is previously been given medication for peptic ulcer disease.  Patient is a 26 y.o. female presenting with abdominal pain. The history is provided by the patient. A language interpreter was used (Her husband, who speaks [place] well, acted as her interpretor. ).  Abdominal Pain The primary symptoms of the illness include abdominal pain. The primary symptoms of the illness do not include fever, nausea, vomiting or diarrhea. The current episode started 2 days ago. The onset of the illness was gradual. The problem has not changed since onset. The patient states that she believes she is currently not pregnant. Additional symptoms associated with the illness include heartburn and back pain. Symptoms associated with the illness do not include chills or hematuria. PUD: she has been given medication for peptic ulcer disease in the past.    Past Medical History  Diagnosis Date  . Renal disorder     kidney stones  . Gall stones     Past Surgical History  Procedure Date  . Cesarean section     No family history on file.  History  Substance Use Topics  . Smoking status: Never Smoker   . Smokeless tobacco: Not on file  . Alcohol Use: No    OB History    Grav Para Term Preterm Abortions TAB SAB Ect Mult Living   3    1     2       Review  of Systems  Constitutional: Negative for fever and chills.  HENT: Negative.   Eyes: Negative.   Respiratory: Negative.   Cardiovascular: Negative.   Gastrointestinal: Positive for heartburn and abdominal pain. Negative for nausea, vomiting and diarrhea.  Genitourinary: Negative for hematuria.       She has had a kidney stone in the past.  Musculoskeletal: Positive for back pain.  Skin: Negative.   Neurological: Negative.   Psychiatric/Behavioral: Negative.     Allergies  Diphenhydramine hcl  Home Medications  No current outpatient prescriptions on file.  BP 95/60  Pulse 75  Temp 98.2 F (36.8 C) (Oral)  Resp 18  SpO2 100%  LMP 11/03/2011  Physical Exam  Nursing note and vitals reviewed. Constitutional: She is oriented to person, place, and time.       Young woman in mild distress with epigastric abdominal pain.  HENT:  Head: Normocephalic.  Right Ear: External ear normal.  Left Ear: External ear normal.  Mouth/Throat: Oropharynx is clear and moist.  Eyes: Conjunctivae normal and EOM are normal. Pupils are equal, round, and reactive to light. No scleral icterus.  Neck: Normal range of motion. Neck supple.  Cardiovascular: Normal rate, regular rhythm and normal heart sounds.   Pulmonary/Chest: Effort normal and breath sounds normal.  Abdominal: Soft. Bowel sounds are normal.  Musculoskeletal: Normal range of motion.  Neurological: She is alert and oriented to person, place, and  time.       No sensory or motor deficit.  Skin: Skin is warm and dry. No erythema.  Psychiatric: She has a normal mood and affect. Her behavior is normal.    ED Course  Procedures (including critical care time)   Results for orders placed during the hospital encounter of 11/21/11  URINALYSIS, ROUTINE W REFLEX MICROSCOPIC      Component Value Range   Color, Urine YELLOW  YELLOW   APPearance CLOUDY (*) CLEAR   Specific Gravity, Urine 1.020  1.005 - 1.030   pH 6.0  5.0 - 8.0   Glucose,  UA NEGATIVE  NEGATIVE mg/dL   Hgb urine dipstick MODERATE (*) NEGATIVE   Bilirubin Urine NEGATIVE  NEGATIVE   Ketones, ur NEGATIVE  NEGATIVE mg/dL   Protein, ur NEGATIVE  NEGATIVE mg/dL   Urobilinogen, UA 0.2  0.0 - 1.0 mg/dL   Nitrite NEGATIVE  NEGATIVE   Leukocytes, UA MODERATE (*) NEGATIVE  CBC WITH DIFFERENTIAL      Component Value Range   WBC 12.2 (*) 4.0 - 10.5 K/uL   RBC 4.35  3.87 - 5.11 MIL/uL   Hemoglobin 12.9  12.0 - 15.0 g/dL   HCT 11.9  14.7 - 82.9 %   MCV 88.0  78.0 - 100.0 fL   MCH 29.7  26.0 - 34.0 pg   MCHC 33.7  30.0 - 36.0 g/dL   RDW 56.2  13.0 - 86.5 %   Platelets 272  150 - 400 K/uL   Neutrophils Relative 62  43 - 77 %   Neutro Abs 7.5  1.7 - 7.7 K/uL   Lymphocytes Relative 23  12 - 46 %   Lymphs Abs 2.8  0.7 - 4.0 K/uL   Monocytes Relative 4  3 - 12 %   Monocytes Absolute 0.4  0.1 - 1.0 K/uL   Eosinophils Relative 11 (*) 0 - 5 %   Eosinophils Absolute 1.3 (*) 0.0 - 0.7 K/uL   Basophils Relative 1  0 - 1 %   Basophils Absolute 0.1  0.0 - 0.1 K/uL  BASIC METABOLIC PANEL      Component Value Range   Sodium 140  135 - 145 mEq/L   Potassium 3.8  3.5 - 5.1 mEq/L   Chloride 104  96 - 112 mEq/L   CO2 26  19 - 32 mEq/L   Glucose, Bld 85  70 - 99 mg/dL   BUN 7  6 - 23 mg/dL   Creatinine, Ser 7.84  0.50 - 1.10 mg/dL   Calcium 9.3  8.4 - 69.6 mg/dL   GFR calc non Af Amer >90  >90 mL/min   GFR calc Af Amer >90  >90 mL/min  POCT PREGNANCY, URINE      Component Value Range   Preg Test, Ur NEGATIVE  NEGATIVE  URINE MICROSCOPIC-ADD ON      Component Value Range   Squamous Epithelial / LPF FEW (*) RARE   WBC, UA 7-10  <3 WBC/hpf   RBC / HPF 7-10  <3 RBC/hpf   Bacteria, UA FEW (*) RARE    WBC mildly elevated at 12,000.  UA essentially negative.  Move to CDU.  Abdominal ultrasound ordered.  If positive, referral to general surgery.  If negative, Rx for peptic ulcer disease with PPI.    1. Peptic ulcer disease    Pt's ultrasound was negative, but it  suggested that liver lesions seen in the past had become larger.  She had CT of  the abdomen, which showed no liver lesions.  She was treated for peptic ulcer disease and was referred for followup to GI.     Carleene Cooper III, MD 11/21/11 2130

## 2011-11-21 NOTE — ED Notes (Signed)
Pt transported to US

## 2011-11-21 NOTE — ED Notes (Addendum)
Report received from [facility], patient transferred from A-9 to CDU3.  Patient complaining of severe abdominal pain all over, almost like a "burning" of her intestine.  Her pain is 10/10.

## 2011-11-21 NOTE — ED Notes (Signed)
Pt c/o of abdominal pain that is more upper. Denies n/v, vaginal bleeding/discharge. Period went off last week but stayed on for 2 weeks and was heavier than normal. C/o of headache, blurred vision, and dizziness.

## 2011-11-22 NOTE — ED Provider Notes (Signed)
Medical screening examination/treatment/procedure(s) were performed by non-physician practitioner and as supervising physician I was immediately available for consultation/collaboration.   Ica Daye W Shajuana Mclucas, MD 11/22/11 0103 

## 2012-01-14 ENCOUNTER — Emergency Department (HOSPITAL_COMMUNITY)
Admission: EM | Admit: 2012-01-14 | Discharge: 2012-01-15 | Disposition: A | Discharge status: Home / Self Care | Payer: Self-pay | Attending: Emergency Medicine | Admitting: Emergency Medicine

## 2012-01-14 ENCOUNTER — Encounter (HOSPITAL_COMMUNITY): Payer: Self-pay | Admitting: *Deleted

## 2012-01-14 DIAGNOSIS — Z87442 Personal history of urinary calculi: Secondary | ICD-10-CM | POA: Insufficient documentation

## 2012-01-14 DIAGNOSIS — R112 Nausea with vomiting, unspecified: Secondary | ICD-10-CM | POA: Insufficient documentation

## 2012-01-14 DIAGNOSIS — R42 Dizziness and giddiness: Secondary | ICD-10-CM | POA: Insufficient documentation

## 2012-01-14 DIAGNOSIS — R197 Diarrhea, unspecified: Secondary | ICD-10-CM | POA: Insufficient documentation

## 2012-01-14 DIAGNOSIS — R51 Headache: Secondary | ICD-10-CM | POA: Insufficient documentation

## 2012-01-14 DIAGNOSIS — Z3490 Encounter for supervision of normal pregnancy, unspecified, unspecified trimester: Secondary | ICD-10-CM

## 2012-01-14 DIAGNOSIS — R1013 Epigastric pain: Secondary | ICD-10-CM | POA: Insufficient documentation

## 2012-01-14 DIAGNOSIS — Z8719 Personal history of other diseases of the digestive system: Secondary | ICD-10-CM | POA: Insufficient documentation

## 2012-01-14 DIAGNOSIS — O219 Vomiting of pregnancy, unspecified: Secondary | ICD-10-CM | POA: Insufficient documentation

## 2012-01-14 DIAGNOSIS — R5381 Other malaise: Secondary | ICD-10-CM | POA: Insufficient documentation

## 2012-01-14 DIAGNOSIS — R5383 Other fatigue: Secondary | ICD-10-CM | POA: Insufficient documentation

## 2012-01-14 DIAGNOSIS — Z3201 Encounter for pregnancy test, result positive: Secondary | ICD-10-CM | POA: Insufficient documentation

## 2012-01-14 LAB — CBC WITH DIFFERENTIAL/PLATELET
Basophils Absolute: 0 10*3/uL (ref 0.0–0.1)
Basophils Relative: 0 % (ref 0–1)
Eosinophils Absolute: 1.1 10*3/uL — ABNORMAL HIGH (ref 0.0–0.7)
Eosinophils Relative: 7 % — ABNORMAL HIGH (ref 0–5)
Lymphs Abs: 2.9 10*3/uL (ref 0.7–4.0)
MCH: 30.7 pg (ref 26.0–34.0)
MCHC: 34.9 g/dL (ref 30.0–36.0)
MCV: 88.2 fL (ref 78.0–100.0)
Platelets: 298 10*3/uL (ref 150–400)
RDW: 12.5 % (ref 11.5–15.5)

## 2012-01-14 LAB — COMPREHENSIVE METABOLIC PANEL
ALT: 26 U/L (ref 0–35)
Albumin: 4 g/dL (ref 3.5–5.2)
Calcium: 9.6 mg/dL (ref 8.4–10.5)
GFR calc Af Amer: >90 mL/min (ref >90)
Glucose, Bld: 84 mg/dL (ref 70–99)
Sodium: 136 mEq/L (ref 135–145)
Total Protein: 7.9 g/dL (ref 6.0–8.3)

## 2012-01-14 MED ORDER — SODIUM CHLORIDE 0.9 % IV BOLUS (SEPSIS)
1000.0000 mL | Freq: Once | INTRAVENOUS | Status: AC
  Administered 2012-01-15: 1000 mL via INTRAVENOUS

## 2012-01-14 MED ORDER — FAMOTIDINE IN NACL 20-0.9 MG/50ML-% IV SOLN
20.0000 mg | Freq: Once | INTRAVENOUS | Status: AC
  Administered 2012-01-15: 20 mg via INTRAVENOUS
  Filled 2012-01-14: qty 50

## 2012-01-14 MED ORDER — ONDANSETRON HCL 4 MG/2ML IJ SOLN
4.0000 mg | Freq: Once | INTRAMUSCULAR | Status: AC
  Administered 2012-01-15: 4 mg via INTRAVENOUS
  Filled 2012-01-14: qty 2

## 2012-01-14 NOTE — ED Notes (Signed)
Patient with nausea, vomiting, and diarrhea since yesterday afternoon.

## 2012-01-15 LAB — POCT PREGNANCY, URINE: Preg Test, Ur: POSITIVE — AB

## 2012-01-15 LAB — URINALYSIS, ROUTINE W REFLEX MICROSCOPIC
Nitrite: NEGATIVE
Specific Gravity, Urine: 1.033 — ABNORMAL HIGH (ref 1.005–1.030)
Urobilinogen, UA: 1 mg/dL (ref 0.0–1.0)

## 2012-01-15 LAB — URINE MICROSCOPIC-ADD ON

## 2012-01-15 MED ORDER — PANTOPRAZOLE SODIUM 40 MG IV SOLR
40.0000 mg | Freq: Once | INTRAVENOUS | Status: AC
  Administered 2012-01-15: 40 mg via INTRAVENOUS
  Filled 2012-01-15: qty 40

## 2012-01-15 MED ORDER — SODIUM CHLORIDE 0.9 % IV BOLUS (SEPSIS)
1000.0000 mL | Freq: Once | INTRAVENOUS | Status: AC
  Administered 2012-01-15: 1000 mL via INTRAVENOUS

## 2012-01-15 MED ORDER — PROMETHAZINE HCL 25 MG PO TABS: 25.0000 mg | ORAL_TABLET | Freq: Four times a day (QID) | ORAL | Status: DC | PRN

## 2012-01-15 MED ORDER — ONDANSETRON 4 MG PO TBDP: 4.0000 mg | ORAL_TABLET | Freq: Three times a day (TID) | ORAL | Status: DC | PRN

## 2012-01-15 MED ORDER — ONDANSETRON HCL 4 MG/2ML IJ SOLN
4.0000 mg | Freq: Once | INTRAMUSCULAR | Status: AC
  Administered 2012-01-15: 4 mg via INTRAVENOUS
  Filled 2012-01-15: qty 2

## 2012-01-15 MED ORDER — PROMETHAZINE HCL 25 MG/ML IJ SOLN
12.5000 mg | Freq: Once | INTRAMUSCULAR | Status: DC
  Filled 2012-01-15: qty 1

## 2012-01-15 NOTE — ED Provider Notes (Signed)
26 year old female who presents with new pregnancy, persistent nausea and vomiting, on abdominal exam she has no tenderness, she has an ultrasound evidence of intrauterine pregnancy on my testing, she has improved significantly after Zofran Phenergan IV fluids and is amenable to discharge at this time. OB/GYN phone numbers given and followup.   Medical screening examination/treatment/procedure(s) were conducted as a shared visit with non-physician practitioner(s) and myself.  I personally evaluated the patient during the encounter    Vida Roller, MD 01/15/12 [phone]

## 2012-01-15 NOTE — ED Provider Notes (Signed)
History     CSN: [id number]  Arrival date & time 01/14/12  2009   First MD Initiated Contact with Patient 01/14/12 2300      Chief Complaint  Patient presents with  . Emesis    (Consider location/radiation/quality/duration/timing/severity/associated sxs/prior treatment) HPI Vanessa Henson is a 26 y.o. female who presents to ED with complaint of nausea, vomiting, diarrhea for two days. Multiple episodes of vomiting. About 5 episodes of watery diarrhea today. Pt states pain in the epigastric area. Hx of gastric reflux, not on any medications at this time. Pt state she believes she is pregnant, last period in September, positive test at home.   Pt denies lower abdominal pain, vaginal bleeding or discharge. Admits to weakness, dizziness, malaise. Nothing making symptoms better or worse. No medications taken prior to coming in.    Past Medical History  Diagnosis Date  . Renal disorder     kidney stones  . Gall stones     Past Surgical History  Procedure Date  . Cesarean section     History reviewed. No pertinent family history.  History  Substance Use Topics  . Smoking status: Never Smoker   . Smokeless tobacco: Not on file  . Alcohol Use: No    OB History    Grav Para Term Preterm Abortions TAB SAB Ect Mult Living   3    1     2       Review of Systems  Constitutional: Positive for fatigue.  Gastrointestinal: Positive for nausea, vomiting, abdominal pain and diarrhea.  Genitourinary: Negative for dysuria, vaginal bleeding, vaginal discharge and pelvic pain.  Neurological: Positive for dizziness, weakness, light-headedness and headaches.  All other systems reviewed and are negative.    Allergies  Diphenhydramine hcl  Home Medications  No current outpatient prescriptions on file.  BP 94/45  Pulse 65  Temp 99.4 F (37.4 C) (Oral)  Resp 16  SpO2 100%  Physical Exam  Vitals reviewed. Constitutional: She is oriented to person, place, and time. She  appears well-developed and well-nourished. No distress.  HENT:  Head: Normocephalic.  Eyes: Conjunctivae normal are normal.  Neck: Neck supple.  Cardiovascular: Normal rate, regular rhythm and normal heart sounds.   Pulmonary/Chest: Effort normal and breath sounds normal. No respiratory distress. She has no wheezes. She has no rales.  Abdominal: Soft. Bowel sounds are normal. She exhibits no distension. There is tenderness. There is no rebound and no guarding.       Epigastric tenderness  Musculoskeletal: She exhibits no edema.  Neurological: She is alert and oriented to person, place, and time.  Skin: Skin is warm and dry.  Psychiatric: She has a normal mood and affect.    ED Course  Procedures (including critical care time)  Pt with prior peptic ulcer disease/GERD, here with N/V/D for two days. Pt is now pregnant about 3mon.  Will start fluids, anti emetics, pepcid.   Results for orders placed during the hospital encounter of 01/14/12  CBC WITH DIFFERENTIAL      Component Value Range   WBC 16.3 (*) 4.0 - 10.5 K/uL   RBC 4.23  3.87 - 5.11 MIL/uL   Hemoglobin 13.0  12.0 - 15.0 g/dL   HCT 78.2  95.6 - 21.3 %   MCV 88.2  78.0 - 100.0 fL   MCH 30.7  26.0 - 34.0 pg   MCHC 34.9  30.0 - 36.0 g/dL   RDW 08.6  57.8 - 46.9 %   Platelets 298  150 - 400 K/uL   Neutrophils Relative 71  43 - 77 %   Neutro Abs 11.4 (*) 1.7 - 7.7 K/uL   Lymphocytes Relative 18  12 - 46 %   Lymphs Abs 2.9  0.7 - 4.0 K/uL   Monocytes Relative 5  3 - 12 %   Monocytes Absolute 0.8  0.1 - 1.0 K/uL   Eosinophils Relative 7 (*) 0 - 5 %   Eosinophils Absolute 1.1 (*) 0.0 - 0.7 K/uL   Basophils Relative 0  0 - 1 %   Basophils Absolute 0.0  0.0 - 0.1 K/uL  COMPREHENSIVE METABOLIC PANEL      Component Value Range   Sodium 136  135 - 145 mEq/L   Potassium 3.8  3.5 - 5.1 mEq/L   Chloride 100  96 - 112 mEq/L   CO2 26  19 - 32 mEq/L   Glucose, Bld 84  70 - 99 mg/dL   BUN 7  6 - 23 mg/dL   Creatinine, Ser 4.09   0.50 - 1.10 mg/dL   Calcium 9.6  8.4 - 81.1 mg/dL   Total Protein 7.9  6.0 - 8.3 g/dL   Albumin 4.0  3.5 - 5.2 g/dL   AST 15  0 - 37 U/L   ALT 26  0 - 35 U/L   Alkaline Phosphatase 100  39 - 117 U/L   Total Bilirubin 0.3  0.3 - 1.2 mg/dL   GFR calc non Af Amer >90  >90 mL/min   GFR calc Af Amer >90  >90 mL/min  LIPASE, BLOOD      Component Value Range   Lipase 20  11 - 59 U/L   No results found.  2:03 AM Pt not improved with 1LNS, zofran 4mg  IV, pepcid IV. Will add another liter, more zofran, protonix. Pt with multiple prior evaluation. She has had numerous negative abd  USs, including last one on 10/10 and negative CT on 10/10. BSide [place], performed by Dr. Hyacinth Meeker, confirmed IUP.   No diagnosis found.    MDM          Lottie Mussel, PA 01/18/12 [phone]

## 2012-01-15 NOTE — ED Notes (Signed)
Pt walked to bathroom to obtain a urine sample 

## 2012-01-16 LAB — URINE CULTURE: Colony Count: 75000

## 2012-01-19 NOTE — ED Provider Notes (Signed)
Medical screening examination/treatment/procedure(s) were performed by non-physician practitioner and as supervising physician I was immediately available for consultation/collaboration.   Joya Gaskins, MD 01/19/12 [phone]

## 2012-02-12 DIAGNOSIS — K8020 Calculus of gallbladder without cholecystitis without obstruction: Secondary | ICD-10-CM

## 2012-02-12 HISTORY — DX: Calculus of gallbladder without cholecystitis without obstruction: K80.20

## 2012-02-12 NOTE — L&D Delivery Note (Signed)
Delivery Note At 9:55 PM a viable female was delivered via Vaginal, Spontaneous Delivery/VBAC (Presentation: Left Occiput Anterior).  APGAR: 6, 8; weight 5 lb 7.8 oz (2489 g).   Placenta status: Intact, Spontaneous Pathology.  Cord: 3 vessels with the following complications: None.  Cord pH: N/A  Anesthesia: Epidural  Episiotomy: None Lacerations: None Est. Blood Loss (mL): 250  Mom to postpartum.  Baby to NICU.  Everlene Other 07/07/2012, 10:39 PM

## 2012-02-12 NOTE — L&D Delivery Note (Signed)
I have seen and examined this patient and I agree with the above. Joby Richart 3:07 AM 07/08/2012

## 2012-03-05 ENCOUNTER — Inpatient Hospital Stay (HOSPITAL_COMMUNITY): Payer: Self-pay

## 2012-03-05 ENCOUNTER — Encounter (HOSPITAL_COMMUNITY): Payer: Self-pay

## 2012-03-05 ENCOUNTER — Inpatient Hospital Stay (HOSPITAL_COMMUNITY)
Admission: AD | Admit: 2012-03-05 | Discharge: 2012-03-05 | Disposition: A | Discharge status: Home / Self Care | Payer: Self-pay | Source: Ambulatory Visit | Attending: Obstetrics & Gynecology | Admitting: Obstetrics & Gynecology

## 2012-03-05 DIAGNOSIS — A499 Bacterial infection, unspecified: Secondary | ICD-10-CM | POA: Insufficient documentation

## 2012-03-05 DIAGNOSIS — O2390 Unspecified genitourinary tract infection in pregnancy, unspecified trimester: Secondary | ICD-10-CM | POA: Insufficient documentation

## 2012-03-05 DIAGNOSIS — K117 Disturbances of salivary secretion: Secondary | ICD-10-CM

## 2012-03-05 DIAGNOSIS — O9989 Other specified diseases and conditions complicating pregnancy, childbirth and the puerperium: Secondary | ICD-10-CM

## 2012-03-05 DIAGNOSIS — R109 Unspecified abdominal pain: Secondary | ICD-10-CM | POA: Insufficient documentation

## 2012-03-05 DIAGNOSIS — O26899 Other specified pregnancy related conditions, unspecified trimester: Secondary | ICD-10-CM

## 2012-03-05 DIAGNOSIS — B9689 Other specified bacterial agents as the cause of diseases classified elsewhere: Secondary | ICD-10-CM | POA: Insufficient documentation

## 2012-03-05 DIAGNOSIS — N760 Acute vaginitis: Secondary | ICD-10-CM | POA: Insufficient documentation

## 2012-03-05 LAB — URINALYSIS, ROUTINE W REFLEX MICROSCOPIC
Ketones, ur: NEGATIVE mg/dL
Leukocytes, UA: NEGATIVE
Nitrite: NEGATIVE
Protein, ur: NEGATIVE mg/dL

## 2012-03-05 LAB — COMPREHENSIVE METABOLIC PANEL
AST: 11 U/L (ref 0–37)
BUN: 6 mg/dL (ref 6–23)
CO2: 24 mEq/L (ref 19–32)
Chloride: 100 mEq/L (ref 96–112)
Creatinine, Ser: 0.5 mg/dL (ref 0.50–1.10)
GFR calc non Af Amer: >90 mL/min (ref >90)
Total Bilirubin: 0.2 mg/dL — ABNORMAL LOW (ref 0.3–1.2)

## 2012-03-05 LAB — CBC
HCT: 32.4 % — ABNORMAL LOW (ref 36.0–46.0)
MCV: 88.8 fL (ref 78.0–100.0)
Platelets: 269 10*3/uL (ref 150–400)
RBC: 3.65 MIL/uL — ABNORMAL LOW (ref 3.87–5.11)
WBC: 16.3 10*3/uL — ABNORMAL HIGH (ref 4.0–10.5)

## 2012-03-05 MED ORDER — GLYCOPYRROLATE 2 MG PO TABS: 2.0000 mg | ORAL_TABLET | Freq: Three times a day (TID) | ORAL | Status: DC

## 2012-03-05 MED ORDER — METRONIDAZOLE 500 MG PO TABS: 500.0000 mg | ORAL_TABLET | Freq: Two times a day (BID) | ORAL | Status: DC

## 2012-03-05 MED ORDER — ONDANSETRON 8 MG PO TBDP: 8.0000 mg | ORAL_TABLET | Freq: Three times a day (TID) | ORAL | Status: DC | PRN

## 2012-03-05 MED ORDER — PROMETHAZINE HCL 25 MG PO TABS: 25.0000 mg | ORAL_TABLET | Freq: Four times a day (QID) | ORAL | Status: DC | PRN

## 2012-03-05 MED ORDER — GLYCOPYRROLATE 1 MG PO TABS
1.0000 mg | ORAL_TABLET | Freq: Once | ORAL | Status: AC
  Administered 2012-03-05: 1 mg via ORAL
  Filled 2012-03-05: qty 1

## 2012-03-05 NOTE — MAU Note (Signed)
Patient states she has had no prenatal care. States she has had some abdominal pain yesterday and during the night. Is unsure of LMP and gestation. States she is having spitting. Denies bleeding or leaking. Having lower abdominal pressure.

## 2012-03-05 NOTE — MAU Provider Note (Signed)
History     CSN: [id number]  Arrival date and time: 03/05/12 1128   First Provider Initiated Contact with Patient 03/05/12 1338      Chief Complaint  Patient presents with  . Morning Sickness   HPI Pt is ?[redacted] weeks pregnant- unsure of LMP complains of lower abdominal pain/pressure.  Pt denies spotting or bleeding, UTI symptoms, constipation or diarrhea.  Pt has a NOB appointment at [facility] on 1/30.  Pt also has had spitting.  Pt had a prescription for phenergan and Zofran which she used- but it didn't help with the spitting.  Past Medical History  Diagnosis Date  . Renal disorder     kidney stones  . Gall stones     Past Surgical History  Procedure Date  . Cesarean section   . Eye surgery     History reviewed. No pertinent family history.  History  Substance Use Topics  . Smoking status: Never Smoker   . Smokeless tobacco: Not on file  . Alcohol Use: No    Allergies:  Allergies  Allergen Reactions  . Diphenhydramine Hcl Other (See Comments)    Dizziness and shortness of breath.    No prescriptions prior to admission    Review of Systems  Constitutional: Negative for fever and chills.  Gastrointestinal: Positive for nausea, vomiting and abdominal pain. Negative for diarrhea and constipation.  Genitourinary: Negative for dysuria and urgency.   Physical Exam   Blood pressure 100/59, pulse 72, temperature 98.4 F (36.9 C), temperature source Oral, resp. rate 16, height 5\' 1"  (1.549 m), weight 167 lb 6.4 oz (75.932 kg), SpO2 100.00%.  Physical Exam  Vitals reviewed. Constitutional: She is oriented to person, place, and time. She appears well-developed and well-nourished. No distress.  HENT:  Head: Normocephalic.  Eyes: Pupils are equal, round, and reactive to light.  Neck: Normal range of motion. Neck supple.  Cardiovascular: Normal rate.   Respiratory: Effort normal.  GI: Soft. Bowel sounds are normal. She exhibits no distension. There is  tenderness. There is no rebound and no guarding.       FHT easily audible with doppler  Genitourinary:       Mod amount of white frothy discharge; cervix parous, NT; uterus enlarged gravid ?16 weeks  Musculoskeletal: Normal range of motion.  Neurological: She is alert and oriented to person, place, and time.  Skin: Skin is warm and dry.  Psychiatric: She has a normal mood and affect.    MAU Course  Procedures Results for orders placed during the hospital encounter of 03/05/12 (from the past 24 hour(s))  URINALYSIS, ROUTINE W REFLEX MICROSCOPIC     Status: Normal   Collection Time   03/05/12 12:10 PM      Component Value Range   Color, Urine YELLOW  YELLOW   APPearance CLEAR  CLEAR   Specific Gravity, Urine 1.025  1.005 - 1.030   pH 7.0  5.0 - 8.0   Glucose, UA NEGATIVE  NEGATIVE mg/dL   Hgb urine dipstick NEGATIVE  NEGATIVE   Bilirubin Urine NEGATIVE  NEGATIVE   Ketones, ur NEGATIVE  NEGATIVE mg/dL   Protein, ur NEGATIVE  NEGATIVE mg/dL   Urobilinogen, UA 0.2  0.0 - 1.0 mg/dL   Nitrite NEGATIVE  NEGATIVE   Leukocytes, UA NEGATIVE  NEGATIVE  CBC     Status: Abnormal   Collection Time   03/05/12  1:48 PM      Component Value Range   WBC 16.3 (*) 4.0 - 10.5  K/uL   RBC 3.65 (*) 3.87 - 5.11 MIL/uL   Hemoglobin 11.0 (*) 12.0 - 15.0 g/dL   HCT 16.1 (*) 09.6 - 04.5 %   MCV 88.8  78.0 - 100.0 fL   MCH 30.1  26.0 - 34.0 pg   MCHC 34.0  30.0 - 36.0 g/dL   RDW 40.9  81.1 - 91.4 %   Platelets 269  150 - 400 K/uL  COMPREHENSIVE METABOLIC PANEL     Status: Abnormal   Collection Time   03/05/12  1:48 PM      Component Value Range   Sodium 135  135 - 145 mEq/L   Potassium 3.5  3.5 - 5.1 mEq/L   Chloride 100  96 - 112 mEq/L   CO2 24  19 - 32 mEq/L   Glucose, Bld 79  70 - 99 mg/dL   BUN 6  6 - 23 mg/dL   Creatinine, Ser 7.82  0.50 - 1.10 mg/dL   Calcium 8.8  8.4 - 95.6 mg/dL   Total Protein 6.8  6.0 - 8.3 g/dL   Albumin 2.9 (*) 3.5 - 5.2 g/dL   AST 11  0 - 37 U/L   ALT 16  0 - 35  U/L   Alkaline Phosphatase 101  39 - 117 U/L   Total Bilirubin 0.2 (*) 0.3 - 1.2 mg/dL   GFR calc non Af Amer >90  >90 mL/min   GFR calc Af Amer >90  >90 mL/min  WET PREP, GENITAL     Status: Abnormal   Collection Time   03/05/12  2:05 PM      Component Value Range   Yeast Wet Prep HPF POC NONE SEEN  NONE SEEN   Trich, Wet Prep NONE SEEN  NONE SEEN   Clue Cells Wet Prep HPF POC MANY (*) NONE SEEN   WBC, Wet Prep HPF POC MANY (*) NONE SEEN    Assessment and Plan  Single living IUP [redacted]w[redacted]d Bacterial vaginosis- flagyl 500mg  BID for 7 days F/u at [facility] for NOB  Vanessa Henson 03/05/2012, 3:11 PM

## 2012-03-12 ENCOUNTER — Ambulatory Visit (INDEPENDENT_AMBULATORY_CARE_PROVIDER_SITE_OTHER): Payer: Self-pay | Admitting: Obstetrics and Gynecology

## 2012-03-12 ENCOUNTER — Encounter: Payer: Self-pay | Admitting: Obstetrics and Gynecology

## 2012-03-12 VITALS — BP 88/55 | Wt 166.0 lb

## 2012-03-12 DIAGNOSIS — Z124 Encounter for screening for malignant neoplasm of cervix: Secondary | ICD-10-CM

## 2012-03-12 DIAGNOSIS — Z3480 Encounter for supervision of other normal pregnancy, unspecified trimester: Secondary | ICD-10-CM

## 2012-03-12 DIAGNOSIS — O34219 Maternal care for unspecified type scar from previous cesarean delivery: Secondary | ICD-10-CM

## 2012-03-12 DIAGNOSIS — O3421 Maternal care for scar from previous cesarean delivery: Secondary | ICD-10-CM

## 2012-03-12 DIAGNOSIS — O09299 Supervision of pregnancy with other poor reproductive or obstetric history, unspecified trimester: Secondary | ICD-10-CM

## 2012-03-12 DIAGNOSIS — Z8632 Personal history of gestational diabetes: Secondary | ICD-10-CM | POA: Insufficient documentation

## 2012-03-12 DIAGNOSIS — Z98891 History of uterine scar from previous surgery: Secondary | ICD-10-CM | POA: Insufficient documentation

## 2012-03-12 DIAGNOSIS — Z113 Encounter for screening for infections with a predominantly sexual mode of transmission: Secondary | ICD-10-CM

## 2012-03-12 NOTE — Progress Notes (Signed)
   Subjective:    Vanessa Henson is a [id number] [redacted]w[redacted]d being seen today for her first obstetrical visit.  Her obstetrical history is significant for previous cesarean section secondary to failure to descent and GDM, both in the last pregnancy. Patient does intend to breast feed. Pregnancy history fully reviewed.  Patient reports no complaints.  Filed Vitals:   03/12/12 1000  BP: 88/55  Weight: 166 lb (75.297 kg)    HISTORY: OB History    Grav Para Term Preterm Abortions TAB SAB Ect Mult Living   4 2 2  1     2      # Outc Date GA Lbr Len/2nd Wgt Sex Del Anes PTL Lv   1 ABT 2006        No   2 TRM 2008    F LTCS   Yes   3 TRM 2010    F SVD   Yes   4 CUR              Past Medical History  Diagnosis Date  . Renal disorder     kidney stones  . Gall stones   . Gestational diabetes 2010   Past Surgical History  Procedure Date  . Cesarean section   . Eye surgery    Family History  Problem Relation Age of Onset  . Diabetes Mother   . Diabetes Father      Exam    Uterus:     Pelvic Exam:    Perineum: Normal Perineum   Vulva: normal   Vagina:  normal mucosa, milky white discharge   pH:    Cervix: closed and long   Adnexa: no mass, fullness, tenderness   Bony Pelvis: android  System: Breast:  normal appearance, no masses or tenderness   Skin: normal coloration and turgor, no rashes    Neurologic: oriented, grossly non-focal   Extremities: normal strength, tone, and muscle mass   HEENT extra ocular movement intact   Mouth/Teeth mucous membranes moist, pharynx normal without lesions and dental hygiene good   Neck supple and no masses   Cardiovascular: regular rate and rhythm   Respiratory:  chest clear, no wheezing, crepitations, rhonchi, normal symmetric air entry   Abdomen: gravid, NT. 18-week size   Urinary:       Assessment:    Pregnancy: [id number] Patient Active Problem List  Diagnosis  . GERD  . Supervision of other normal pregnancy  . Previous  cesarean section complicating pregnancy  . H/O gestational diabetes in prior pregnancy, currently pregnant        Plan:     Initial labs drawn. Prenatal vitamins. Problem list reviewed and updated. Genetic Screening discussed Quad Screen: requested.  Ultrasound discussed; fetal survey: requested.  Follow up in 4 weeks. Reviewed healthy diet and exercise during pregnancy Patient unable to stay for early 1hr GCT today but will return prior to next visit. 50% of 30 min visit spent on counseling and coordination of care.     Rumaisa Schnetzer 03/12/2012

## 2012-03-13 LAB — OBSTETRIC PANEL
Basophils Absolute: 0 10*3/uL (ref 0.0–0.1)
Eosinophils Relative: 2 % (ref 0–5)
Hepatitis B Surface Ag: NEGATIVE
Lymphocytes Relative: 14 % (ref 12–46)
Neutro Abs: 12.1 10*3/uL — ABNORMAL HIGH (ref 1.7–7.7)
Neutrophils Relative %: 80 % — ABNORMAL HIGH (ref 43–77)
Platelets: 310 10*3/uL (ref 150–400)
RBC: 3.73 MIL/uL — ABNORMAL LOW (ref 3.87–5.11)
RDW: 13.1 % (ref 11.5–15.5)
Rubella: 1.31 Index — ABNORMAL HIGH (ref <0.90)
WBC: 15.1 10*3/uL — ABNORMAL HIGH (ref 4.0–10.5)

## 2012-03-13 LAB — HIV ANTIBODY (ROUTINE TESTING W REFLEX): HIV: NONREACTIVE

## 2012-03-17 LAB — ALPHA FETOPROTEIN, MATERNAL
AFP: 37 IU/mL
Curr Gest Age: 17.3 wks.days
MoM for AFP: 1.06
Osb Risk: 1:10800 {titer}

## 2012-03-24 ENCOUNTER — Ambulatory Visit (HOSPITAL_COMMUNITY)
Admission: RE | Admit: 2012-03-24 | Discharge: 2012-03-24 | Disposition: A | Discharge status: Home / Self Care | Payer: Self-pay | Source: Ambulatory Visit | Attending: Obstetrics and Gynecology | Admitting: Obstetrics and Gynecology

## 2012-03-24 ENCOUNTER — Encounter: Payer: Self-pay | Admitting: Obstetrics and Gynecology

## 2012-03-24 DIAGNOSIS — Z3480 Encounter for supervision of other normal pregnancy, unspecified trimester: Secondary | ICD-10-CM | POA: Insufficient documentation

## 2012-03-29 ENCOUNTER — Encounter (HOSPITAL_COMMUNITY): Payer: Self-pay | Admitting: Obstetrics and Gynecology

## 2012-03-29 ENCOUNTER — Inpatient Hospital Stay (HOSPITAL_COMMUNITY)
Admission: AD | Admit: 2012-03-29 | Discharge: 2012-03-29 | Disposition: A | Discharge status: Home / Self Care | Payer: Self-pay | Source: Ambulatory Visit | Attending: Obstetrics & Gynecology | Admitting: Obstetrics & Gynecology

## 2012-03-29 ENCOUNTER — Inpatient Hospital Stay (HOSPITAL_COMMUNITY): Payer: Self-pay

## 2012-03-29 DIAGNOSIS — Z3480 Encounter for supervision of other normal pregnancy, unspecified trimester: Secondary | ICD-10-CM

## 2012-03-29 DIAGNOSIS — O4692 Antepartum hemorrhage, unspecified, second trimester: Secondary | ICD-10-CM

## 2012-03-29 DIAGNOSIS — O209 Hemorrhage in early pregnancy, unspecified: Secondary | ICD-10-CM | POA: Insufficient documentation

## 2012-03-29 DIAGNOSIS — O26859 Spotting complicating pregnancy, unspecified trimester: Secondary | ICD-10-CM

## 2012-03-29 DIAGNOSIS — Z8632 Personal history of gestational diabetes: Secondary | ICD-10-CM

## 2012-03-29 LAB — URINE MICROSCOPIC-ADD ON

## 2012-03-29 LAB — URINALYSIS, ROUTINE W REFLEX MICROSCOPIC
Glucose, UA: NEGATIVE mg/dL
Leukocytes, UA: NEGATIVE
Protein, ur: NEGATIVE mg/dL
Specific Gravity, Urine: 1.025 (ref 1.005–1.030)

## 2012-03-29 LAB — WET PREP, GENITAL

## 2012-03-29 NOTE — MAU Note (Signed)
"  I had heavy bleeding and bad abd cramping that started about 1100 this morning.  The bleeding has stopped, but the cramping has not."

## 2012-03-29 NOTE — MAU Provider Note (Signed)
History     CSN: [id number]  Arrival date and time: 03/29/12 1239   None     Chief Complaint  Patient presents with  . Vaginal Bleeding   HPI:  Vanessa Henson is a 27 y.o. [phone] at [redacted]w[redacted]d presenting with episode of vaginal bleeding this morning. Woke up with mild cramping and noticed blood when went to toilet. Amount similar to period, bright red. No further bleeding since then.  Feeling fetal movement. No loss of fluid. Cramping is mild, no strong contractions.  Last intercourse 4 days ago. Baby moving well. Pt also c/o pain in both legs up back of leg to low back that has been going on for a few days. Normal anatomy scan on 03/24/12 with no placenta previa.  Vaginal Bleeding This is a new problem. The current episode started today. Episode frequency: once. The problem has been resolved. The pain is mild. She is pregnant. Associated symptoms include back pain. Pertinent negatives include no chills, dysuria, fever, headaches, nausea or vomiting.   Patient is getting prenatal care at [facility]. History of GDM with first baby and c-section. VBAC 2nd pregnancy.   OB History   Grav Para Term Preterm Abortions TAB SAB Ect Mult Living   4 2 2  1  1   2       Past Medical History  Diagnosis Date  . Renal disorder     kidney stones  . Gall stones   . Gestational diabetes 2010    Past Surgical History  Procedure Laterality Date  . Cesarean section    . Eye surgery      Family History  Problem Relation Age of Onset  . Diabetes Mother   . Diabetes Father     History  Substance Use Topics  . Smoking status: Never Smoker   . Smokeless tobacco: Not on file  . Alcohol Use: No    Allergies:  Allergies  Allergen Reactions  . Diphenhydramine Hcl Other (See Comments)    Dizziness and shortness of breath.    Prescriptions prior to admission  Medication Sig Dispense Refill  . Prenatal Vit-Fe Fumarate-FA (MULTIVITAMIN-PRENATAL) 27-0.8 MG TABS Take 1 tablet by mouth daily.         Review of Systems  Constitutional: Negative for fever and chills.  Eyes: Negative for blurred vision and double vision.  Gastrointestinal: Negative for nausea and vomiting.  Genitourinary: Positive for vaginal bleeding. Negative for dysuria.  Musculoskeletal: Positive for back pain.  Neurological: Negative for headaches.   Physical Exam   Blood pressure 111/55, pulse 84, temperature 98.1 F (36.7 C), temperature source Oral, resp. rate 18, height 5' (1.524 m), weight 76.204 kg (168 lb), last menstrual period 11/06/2011.  Physical Exam  Constitutional: She is oriented to person, place, and time. She appears well-developed and well-nourished. No distress.  HENT:  Head: Normocephalic and atraumatic.  Eyes: Conjunctivae and EOM are normal.  Neck: Normal range of motion. Neck supple.  Cardiovascular: Normal rate, regular rhythm and normal heart sounds.   Respiratory: Effort normal and breath sounds normal. No respiratory distress.  GI: Soft. Bowel sounds are normal. There is no tenderness. There is no rebound and no guarding.  Genitourinary:  Normal external genitalia, normal vagina. Pink/brownish vaginal discharge, no frank blood. No blood at cervix. Cervix closed, thick. No CMT.    Musculoskeletal: She exhibits no edema and no tenderness.  Neurological: She is alert and oriented to person, place, and time.  Skin: Skin is warm and dry.  Psychiatric: She has a normal mood and affect.   Cervix closed, thick, high by digital exam.  FHT by doppler:  146  Results for orders placed during the hospital encounter of 03/29/12 (from the past 24 hour(s))  URINALYSIS, ROUTINE W REFLEX MICROSCOPIC     Status: Abnormal   Collection Time    03/29/12  1:00 PM      Result Value Range   Color, Urine YELLOW  YELLOW   APPearance CLEAR  CLEAR   Specific Gravity, Urine 1.025  1.005 - 1.030   pH 6.0  5.0 - 8.0   Glucose, UA NEGATIVE  NEGATIVE mg/dL   Hgb urine dipstick MODERATE (*) NEGATIVE    Bilirubin Urine NEGATIVE  NEGATIVE   Ketones, ur NEGATIVE  NEGATIVE mg/dL   Protein, ur NEGATIVE  NEGATIVE mg/dL   Urobilinogen, UA 0.2  0.0 - 1.0 mg/dL   Nitrite NEGATIVE  NEGATIVE   Leukocytes, UA NEGATIVE  NEGATIVE  URINE MICROSCOPIC-ADD ON     Status: Abnormal   Collection Time    03/29/12  1:00 PM      Result Value Range   Squamous Epithelial / LPF FEW (*) RARE   WBC, UA 0-2  <3 WBC/hpf   RBC / HPF 0-2  <3 RBC/hpf   Bacteria, UA MANY (*) RARE  WET PREP, GENITAL     Status: Abnormal   Collection Time    03/29/12  1:40 PM      Result Value Range   Yeast Wet Prep HPF POC NONE SEEN  NONE SEEN   Trich, Wet Prep NONE SEEN  NONE SEEN   Clue Cells Wet Prep HPF POC NONE SEEN  NONE SEEN   WBC, Wet Prep HPF POC FEW (*) NONE SEEN     *RADIOLOGY REPORT*  Clinical Data: 27 year old pregnant female with vaginal bleeding.  Assigned gestational age of [redacted] weeks 0 days.  LIMITED OBSTETRIC ULTRASOUND AND TRANSVAGINAL OBSTETRIC ULTRASOUND  Number of Fetuses: 1  Heart Rate: 144 bpm  Movement: Present  Presentation: Transverse head maternal left  Placental Location: Posterior  Previa: No  Amniotic Fluid (Subjective): Normal  BPD: 4.4cm 19w 4d  MATERNAL FINDINGS:  Cervix: Closed 4.3 cm transvaginal  Uterus/Adnexae: The right ovary is unremarkable.  The left ovary is not visualized.  There is no evidence of free fluid or adnexal mass.  IMPRESSION:  Single living intrauterine gestation with assigned gestational age  of [redacted] weeks 0 days. No gross evidence of placental abruption.  This exam is performed on an emergent basis and does not  comprehensively evaluate fetal size, dating, or anatomy, and a  follow-up complete OB US should be considered if further fetal  assessment is warranted.  Original Report Authenticated By: Harmon Pier, M.D.   MAU Course  Procedures  Assessment and Plan  27 y.o. [phone] at 105w0d with one episode of vaginal bleeding today. - No vaginitis, no placenta  previa - Bleeding resolved - Previable fetus, no intervention at this time.  Rh positive. - Pt discharged home with bleeding precautions, return if worsened. F/U in clinic as scheduled.  Napoleon Form 03/29/2012, 1:31 PM

## 2012-03-29 NOTE — MAU Note (Signed)
Ms. Vanessa Henson is here today with complaints of vaginal bleeding and lower abdominal cramping. The symptoms started this morning around 11:00. She receives her prenatal care at [facility]. According to her last [place] she is [redacted]w[redacted]d; last intercourse was 4 days ago. The bleeding is heavy enough to wear a pad, its bad like a period.

## 2012-03-30 ENCOUNTER — Inpatient Hospital Stay (HOSPITAL_COMMUNITY)
Admission: AD | Admit: 2012-03-30 | Discharge: 2012-03-31 | Disposition: A | Discharge status: Home / Self Care | Payer: Self-pay | Source: Ambulatory Visit | Attending: Obstetrics & Gynecology | Admitting: Obstetrics & Gynecology

## 2012-03-30 ENCOUNTER — Encounter (HOSPITAL_COMMUNITY): Payer: Self-pay

## 2012-03-30 DIAGNOSIS — M549 Dorsalgia, unspecified: Secondary | ICD-10-CM | POA: Insufficient documentation

## 2012-03-30 DIAGNOSIS — Z3480 Encounter for supervision of other normal pregnancy, unspecified trimester: Secondary | ICD-10-CM

## 2012-03-30 DIAGNOSIS — Y9301 Activity, walking, marching and hiking: Secondary | ICD-10-CM | POA: Insufficient documentation

## 2012-03-30 DIAGNOSIS — W010XXA Fall on same level from slipping, tripping and stumbling without subsequent striking against object, initial encounter: Secondary | ICD-10-CM | POA: Insufficient documentation

## 2012-03-30 DIAGNOSIS — O09299 Supervision of pregnancy with other poor reproductive or obstetric history, unspecified trimester: Secondary | ICD-10-CM

## 2012-03-30 DIAGNOSIS — O99891 Other specified diseases and conditions complicating pregnancy: Secondary | ICD-10-CM | POA: Insufficient documentation

## 2012-03-30 DIAGNOSIS — Y9229 Other specified public building as the place of occurrence of the external cause: Secondary | ICD-10-CM | POA: Insufficient documentation

## 2012-03-30 LAB — URINALYSIS, ROUTINE W REFLEX MICROSCOPIC
Glucose, UA: NEGATIVE mg/dL
Leukocytes, UA: NEGATIVE
Protein, ur: NEGATIVE mg/dL
Urobilinogen, UA: 0.2 mg/dL (ref 0.0–1.0)

## 2012-03-30 LAB — GC/CHLAMYDIA PROBE AMP: CT Probe RNA: NEGATIVE

## 2012-03-30 LAB — URINE MICROSCOPIC-ADD ON

## 2012-03-30 NOTE — MAU Note (Signed)
Pt states fell at work at [employer], landed on back and hit back of head. Didn't hit abdomen but has felt abdominal tightening since then. Constant low back pain and head pain since fall. Denies vaginal bleeding. Lump on back of head.

## 2012-03-31 NOTE — MAU Provider Note (Signed)
History     CSN: [id number]  Arrival date and time: 03/30/12 2310   First Provider Initiated Contact with Patient 03/31/12 0010      Chief Complaint  Patient presents with  . Back Pain  . Fall   HPI  Vanessa Henson is a 27 y.o. [id number] at [redacted]w[redacted]d by LMP presenting after a fall at work. 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 She struck the back of her head and lower back. She did not lose consciousness. She has a posterior headache currently, but denies nausea, vomiting, vision change, and photophobia. She denies striking her abdomen, having abdominal pain, vaginal bleeding or leakage of fluids from her vagina. She has felt the baby move within the last hour.   OB History   Grav Para Term Preterm Abortions TAB SAB Ect Mult Living   4 2 2  1  1   2       Past Medical History  Diagnosis Date  . Renal disorder     kidney stones  . Gall stones   . Gestational diabetes 2010    Past Surgical History  Procedure Laterality Date  . Cesarean section    . Eye surgery      Family History  Problem Relation Age of Onset  . Diabetes Mother   . Diabetes Father     History  Substance Use Topics  . Smoking status: Never Smoker   . Smokeless tobacco: Not on file  . Alcohol Use: No    Allergies:  Allergies  Allergen Reactions  . Diphenhydramine Hcl Other (See Comments)    Dizziness and shortness of breath.    Prescriptions prior to admission  Medication Sig Dispense Refill  . Prenatal Vit-Fe Fumarate-FA (MULTIVITAMIN-PRENATAL) 27-0.8 MG TABS Take 1 tablet by mouth daily.        Review of Systems  Constitutional: Negative for fever and chills.  HENT: Positive for neck pain. Negative for hearing loss, ear pain, tinnitus and ear discharge.   Eyes: Negative for blurred vision, double vision and photophobia.  Respiratory: Negative for shortness of breath.   Cardiovascular: Negative for chest pain.  Gastrointestinal: Negative for abdominal pain.   Musculoskeletal: Positive for back pain.  Neurological: Negative for sensory change.   Physical Exam   Blood pressure 112/63, pulse 90, temperature 98 F (36.7 C), temperature source Oral, resp. rate 16, height 5' 0.5" (1.537 m), weight 79.47 kg (175 lb 3.2 oz), last menstrual period 11/06/2011.  Physical Exam  Constitutional: She is oriented to person, place, and time. She appears well-developed and well-nourished. No distress.  HENT:  Head: Normocephalic and atraumatic.  Right Ear: No hemotympanum.  Left Ear: No hemotympanum.  Eyes: Conjunctivae, EOM and lids are normal. Pupils are equal, round, and reactive to light.  Fundoscopic exam:      The right eye shows no hemorrhage and no papilledema. The right eye shows red reflex.       The left eye shows no hemorrhage and no papilledema. The left eye shows red reflex.  GI: Soft.  Gravid, fundus at umbilicus   Musculoskeletal: She exhibits no edema.       Thoracic back: She exhibits tenderness. She exhibits no swelling and no deformity.       Lumbar back: She exhibits tenderness. She exhibits no swelling, no deformity and no laceration.  Neurological: She is alert and oriented to person, place, and time. No cranial nerve deficit.  Psychiatric: She has a normal mood and affect.  Her behavior is normal. Thought content normal.    MAU Course  Procedures - none   MDM Fetal Hear Rate - 145 Tocometer - No contractions   Assessment and Plan  27 year old F [id number] at [redacted]w[redacted]d by LMP with back pain secondary to a mechanical fall at work. She did not have any abdominal trauma or pain. The fetal heart rate was normal, and given that the fetus is pre-viable, there is no indication for continued monitoring at this point. The patient was instructed to take tylenol for pain and follow up with her regular OB-GYN this week. She is agreeable to the plan.   Mat Carne, MD 03/31/2012, 12:31 AM   .I have seen the patient with the  resident/student and agree with the above.  Tawnya Crook

## 2012-03-31 NOTE — MAU Provider Note (Signed)
Attestation of Attending Supervision of Advanced Practitioner (CNM/NP): Evaluation and management procedures were performed by the Advanced Practitioner under my supervision and collaboration.  I have reviewed the Advanced Practitioner's note and chart, and I agree with the management and plan.  HARRAWAY-SMITH, Madailein Londo 2:40 AM

## 2012-04-10 ENCOUNTER — Ambulatory Visit (INDEPENDENT_AMBULATORY_CARE_PROVIDER_SITE_OTHER): Payer: Self-pay | Admitting: Obstetrics & Gynecology

## 2012-04-10 ENCOUNTER — Encounter: Payer: Self-pay | Admitting: Obstetrics & Gynecology

## 2012-04-10 VITALS — BP 96/58 | Wt 171.0 lb

## 2012-04-10 DIAGNOSIS — O09292 Supervision of pregnancy with other poor reproductive or obstetric history, second trimester: Secondary | ICD-10-CM

## 2012-04-10 DIAGNOSIS — O26899 Other specified pregnancy related conditions, unspecified trimester: Secondary | ICD-10-CM

## 2012-04-10 DIAGNOSIS — N898 Other specified noninflammatory disorders of vagina: Secondary | ICD-10-CM

## 2012-04-10 DIAGNOSIS — O09299 Supervision of pregnancy with other poor reproductive or obstetric history, unspecified trimester: Secondary | ICD-10-CM

## 2012-04-10 NOTE — Progress Notes (Signed)
Had a fall at work 2/17 and was cleared at [facility] that night. No problems except pressure and some vulvar swelling. Vaginal exam-some white D/C, wet prep done. Needs 1 hr GTT

## 2012-04-10 NOTE — Progress Notes (Signed)
P - 75 - Pt states she was seen in [facility] for vaginal bleeding 03/29/12 - No bleeding today - Pt states vaginal area feels "swollen"

## 2012-04-10 NOTE — Patient Instructions (Signed)
Embarazo - Segundo trimestre (Pregnancy - Second Trimester) El segundo trimestre del embarazo (del 3 al 6mes) es un perodo de evolucin rpida para usted y el beb. Hacia el final del sexto mes, el beb mide aproximadamente 23 cm y pesa 680 g. Comenzar a sentir los movimientos del beb entre las 18 y las 20 semanas de embarazo. Podr sentir las pataditas ("quickening en ingls"). Hay un rpido aumento de peso. Puede segregar un lquido claro (calostro) de las mamas. Quizs sienta pequeas contracciones en el vientre (tero) Esto se conoce como falso trabajo de parto o contracciones de Braxton-Hicks. Es como una prctica del trabajo de parto que se produce cuando el beb est listo para salir. Generalmente los problemas de vmitos matinales ya se han superado hacia el final del primer trimestre. Algunas mujeres desarrollan pequeas manchas oscuras (que se denominan cloasma, mscara del embarazo) en la cara que normalmente se van luego del nacimiento del beb. La exposicin al sol empeora las manchas. Puede desarrollarse acn en algunas mujeres embarazadas, y puede desaparecer en aquellas que ya tienen acn. EXAMENES PRENATALES  Durante los exmenes prenatales, deber seguir realizando pruebas de sangre, segn avance el embarazo. Estas pruebas se realizan para controlar su salud y la del beb. Tambin se realizan anlisis de sangre para conocer los niveles de hemoglobina. La anemia (bajo nivel de hemoglobina) es frecuente durante el embarazo. Para prevenirla, se administran hierro y vitaminas. Tambin se le realizarn exmenes para saber si tiene diabetes entre las 24 y las 28 semanas del embarazo. Podrn repetirle algunas de las pruebas que le hicieron previamente.  En cada visita le medirn el tamao del tero. Esto se realiza para asegurarse de que el beb est creciendo correctamente de acuerdo al estado del embarazo.  Tambin en cada visita prenatal controlarn su presin arterial. Esto se realiza  para asegurarse de que no tenga toxemia.  Se controlar su orina para asegurarse de que no tenga infecciones, diabetes o protena en la orina.  Se controlar su peso regularmente para asegurarse que el aumento ocurre al ritmo indicado. Esto se hace para asegurarse que usted y el beb tienen una evolucin normal.  En algunas ocasiones se realiza una prueba de ultrasonido para confirmar el correcto desarrollo y evolucin del beb. Esta prueba se realiza con ondas sonoras inofensivas para el beb, de modo que el profesional pueda calcular ms precisamente la fecha del parto. Algunas veces se realizan pruebas especializadas del lquido amnitico que rodea al beb. Esta prueba se denomina amniocentesis. El lquido amnitico se obtiene introduciendo una aguja en el vientre (abdomen). Se realiza para controlar los cromosomas en aquellos casos en los que existe alguna preocupacin acerca de algn problema gentico que pueda sufrir el beb. En ocasiones se lleva a cabo cerca del final del embarazo, si es necesario inducir al parto. En este caso se realiza para asegurarse que los pulmones del beb estn lo suficientemente maduros como para que pueda vivir fuera del tero. CAMBIOS QUE OCURREN EN EL SEGUNDO TRIMESTRE DEL EMBARAZO Su organismo atravesar numerosos cambios durante el embarazo. Estos pueden variar de una persona a otra. Converse con el profesional que la asiste acerca los cambios que usted note y que la preocupen.  Durante el segundo trimestre probablemente sienta un aumento del apetito. Es normal tener "antojos" de ciertas comidas. Esto vara de una persona a otra y de un embarazo a otro.  El abdomen inferior comenzar a abultarse.  Podr tener la necesidad de orinar con ms frecuencia debido a que   el tero y el beb presionan sobre la vejiga. Tambin es frecuente contraer ms infecciones urinarias durante el embarazo (dolor al orinar). Puede evitarlas bebiendo gran cantidad de lquidos y vaciando  la vejiga antes y despus de mantener relaciones sexuales.  Podrn aparecer las primeras estras en las caderas, abdomen y mamas. Estos son cambios normales del cuerpo durante el embarazo. No existen medicamentos ni ejercicios que puedan prevenir estos cambios.  Es posible que comience a desarrollar venas inflamadas y abultadas (varices) en las piernas. El uso de medias de descanso, elevar sus pies durante 15 minutos, 3 a 4 veces al da y limitar la sal en su dieta ayuda a aliviar el problema.  Podr sentir acidez gstrica a medida que el tero crece y presiona contra el estmago. Puede tomar anticidos, con la autorizacin de su mdico, para aliviar este problema. Tambin es til ingerir pequeas comidas 4 a 5 veces al da.  La constipacin puede tratarse con un laxante o agregando fibra a su dieta. Beber grandes cantidades de lquidos, comer vegetales, frutas y granos integrales es de gran ayuda.  Tambin es beneficioso practicar actividad fsica. Si ha sido una persona activa hasta el embarazo, podr continuar con la mayora de las actividades durante el mismo. Si ha sido menos activa, puede ser beneficioso que comience con un programa de ejercicios, como realizar caminatas.  Puede desarrollar hemorroides (vrices en el recto) hacia el final del segundo trimestre. Tomar baos de asiento tibios y utilizar cremas recomendadas por el profesional que lo asiste sern de ayuda para los problemas de hemorroides.  Tambin podr sentir dolor de espalda durante este momento de su embarazo. Evite levantar objetos pesados, utilice zapatos de taco bajo y mantenga una buena postura para ayudar a reducir los problemas de espalda.  Algunas mujeres embarazadas desarrollan hormigueo y adormecimiento de la mano y los dedos debido a la hinchazn y compresin de los ligamentos de la mueca (sndrome del tnel carpiano). Esto desaparece una vez que el beb nace.  Como sus pechos se agrandan, necesitar un sujetador  ms grande. Use un sostn de soporte, cmodo y de algodn. No utilice un sostn para amamantar hasta el ltimo mes de embarazo si va a amamantar al beb.  Podr observar una lnea oscura desde el ombligo hacia la zona pbica denominada linea nigra.  Podr observar que sus mejillas se ponen coloradas debido al aumento de flujo sanguneo en la cara.  Podr desarrollar "araitas" en la cara, cuello y pecho. Esto desaparece una vez que el beb nace. INSTRUCCIONES PARA EL CUIDADO DOMICILIARIO  Es extremadamente importante que evite el cigarrillo, hierbas medicinales, alcohol y las drogas no prescriptas durante el embarazo. Estas sustancias qumicas afectan la formacin y el desarrollo del beb. Evite estas sustancias durante todo el embarazo para asegurar el nacimiento de un beb sano.  La mayor parte de los cuidados que se aconsejan son los mismos que los indicados para el primer trimestre del embarazo. Cumpla con las citas tal como se le indic. Siga las instrucciones del profesional que lo asiste con respecto al uso de los medicamentos, el ejercicio y la dieta.  Durante el embarazo debe obtener nutrientes para usted y para su beb. Consuma alimentos balanceados a intervalos regulares. Elija alimentos como carne, pescado, leche y otros productos lcteos descremados, vegetales, frutas, panes integrales y cereales. El profesional le informar cul es el aumento de peso ideal.  Las relaciones sexuales fsicas pueden continuarse hasta cerca del fin del embarazo si no existen otros problemas. Estos   problemas pueden ser la prdida temprana (prematura) de lquido amnitico de las membranas, sangrado vaginal, dolor abdominal u otros problemas mdicos o del embarazo.  Realice actividad fsica todos los das, si no tiene restricciones. Consulte con el profesional que la asiste si no sabe con certeza si determinados ejercicios son seguros. El mayor aumento de peso tiene lugar durante los ltimos 2 trimestres del  embarazo. El ejercicio la ayudar a:  Controlar su peso.  Ponerla en forma para el parto.  Ayudarla a perder peso luego de haber dado a luz.  Use un buen sostn o como los que se usan para hacer deportes para aliviar la sensibilidad de las mamas. Tambin puede serle til si lo usa mientras duerme. Si pierde calostro, podr utilizar apsitos en el sostn.  No utilice la baera con agua caliente, baos turcos y saunas durante el embarazo.  Utilice el cinturn de seguridad sin excepcin cuando conduzca. Este la proteger a usted y al beb en caso de accidente.  Evite comer carne cruda, queso crudo, y el contacto con los utensilios y desperdicios de los gatos. Estos elementos contienen grmenes que pueden causar defectos de nacimiento en el beb.  El segundo trimestre es un buen momento para visitar a su dentista y evaluar su salud dental si an no lo ha hecho. Es importante mantener los dientes limpios. Utilice un cepillo de dientes blando. Cepllese ms suavemente durante el embarazo.  Es ms fcil perder algo de orina durante el embarazo. Apretar y fortalecer los msculos de la pelvis la ayudar con este problema. Practique detener la miccin cuando est en el bao. Estos son los mismos msculos que necesita fortalecer. Son tambin los mismos msculos que utiliza cuando trata de evitar los gases. Puede practicar apretando estos msculos 10 veces, y repetir esto 3 veces por da aproximadamente. Una vez que conozca qu msculos debe apretar, no realice estos ejercicios durante la miccin. Puede favorecerle una infeccin si la orina vuelve hacia atrs.  Pida ayuda si tiene necesidades econmicas, de asesoramiento o nutricionales durante el embarazo. El profesional podr ayudarla con respecto a estas necesidades, o derivarla a otros especialistas.  La piel puede ponerse grasa. Si esto sucede, lvese la cara con un jabn suave, utilice un humectante no graso y maquillaje con base de aceite o  crema. CONSUMO DE MEDICAMENTOS Y DROGAS DURANTE EL EMBARAZO  Contine tomando las vitaminas apropiadas para esta etapa tal como se le indic. Las vitaminas deben contener un miligramo de cido flico y deben suplementarse con hierro. Guarde todas las vitaminas fuera del alcance de los nios. La ingestin de slo un par de vitaminas o tabletas que contengan hierro puede ocasionar la muerte en un beb o en un nio pequeo.  Evite el uso de medicamentos, inclusive los de venta libre y hierbas que no hayan sido prescriptos o indicados por el profesional que la asiste. Algunos medicamentos pueden causar problemas fsicos al beb. Utilice los medicamentos de venta libre o de prescripcin para el dolor, el malestar o la fiebre, segn se lo indique el profesional que lo asiste. No utilice aspirina.  El consumo de alcohol est relacionado con ciertos defectos de nacimiento. Esto incluye el sndrome de alcoholismo fetal. Debe evitar el consumo de alcohol en cualquiera de sus formas. El cigarrillo causa nacimientos prematuros y bebs de bajo peso. El uso de drogas recreativas est absolutamente prohibido. Son muy nocivas para el beb. Un beb que nace de una madre adicta, ser adicto al nacer. Ese beb tendr los mismos   sntomas de abstinencia que un adulto.  Infrmele al profesional si consume alguna droga.  No consuma drogas ilegales. Pueden causarle mucho dao al beb. SOLICITE ATENCIN MDICA SI: Tiene preguntas o preocupaciones durante su embarazo. Es mejor que llame para consultar las dudas que esperar hasta su prxima visita prenatal. De esta forma se sentir ms tranquila.  SOLICITE ATENCIN MDICA DE INMEDIATO SI:  La temperatura oral se eleva sin motivo por encima de 102 F (38.9 C) o segn le indique el profesional que lo asiste.  Tiene una prdida de lquido por la vagina (canal de parto). Si sospecha una ruptura de las membranas, tmese la temperatura y llame al profesional para informarlo sobre  esto.  Observa unas pequeas manchas, una hemorragia vaginal o elimina cogulos. Notifique al profesional acerca de la cantidad y de cuntos apsitos est utilizando. Unas pequeas manchas de sangre son algo comn durante el embarazo, especialmente despus de mantener relaciones sexuales.  Presenta un olor desagradable en la secrecin vaginal y observa un cambio en el color, de transparente a blanco.  Contina con las nuseas y no obtiene alivio de los remedios indicados. Vomita sangre o algo similar a la borra del caf.  [place] o sube ms de 900 g. en una semana, o segn lo indicado por el profesional que la asiste.  Observa que se le hinchan el rostro, las manos, los pies o las piernas.  Ha estado expuesta a la rubola y no ha sufrido la enfermedad.  Ha estado expuesta a la quinta enfermedad o a la varicela.  Presenta dolor abdominal. Las molestias en el ligamento redondo son una causa no cancerosa (benigna) frecuente de dolor abdominal durante el embarazo. El profesional que la asiste deber evaluarla.  Presenta dolor de cabeza intenso que no se alivia.  Presenta fiebre, diarrea, dolor al orinar o le falta la respiracin.  Presenta dificultad para ver, visin borrosa, o visin doble.  Sufre una cada, un accidente de trnsito o cualquier tipo de trauma.  Vive en un hogar en el que existe violencia fsica o mental. Document Released: 11/07/2004 Document Revised: 04/22/2011 [employer] Patient Information 2013 [employer].  

## 2012-04-14 LAB — WET PREP, GENITAL

## 2012-05-08 ENCOUNTER — Ambulatory Visit (INDEPENDENT_AMBULATORY_CARE_PROVIDER_SITE_OTHER): Payer: Self-pay | Admitting: Obstetrics & Gynecology

## 2012-05-08 VITALS — BP 88/53 | Wt 174.0 lb

## 2012-05-08 DIAGNOSIS — Z603 Acculturation difficulty: Secondary | ICD-10-CM | POA: Insufficient documentation

## 2012-05-08 DIAGNOSIS — Z3482 Encounter for supervision of other normal pregnancy, second trimester: Secondary | ICD-10-CM

## 2012-05-08 DIAGNOSIS — O3421 Maternal care for scar from previous cesarean delivery: Secondary | ICD-10-CM

## 2012-05-08 DIAGNOSIS — N390 Urinary tract infection, site not specified: Secondary | ICD-10-CM

## 2012-05-08 DIAGNOSIS — Z8632 Personal history of gestational diabetes: Secondary | ICD-10-CM

## 2012-05-08 DIAGNOSIS — N760 Acute vaginitis: Secondary | ICD-10-CM

## 2012-05-08 DIAGNOSIS — Z23 Encounter for immunization: Secondary | ICD-10-CM

## 2012-05-08 DIAGNOSIS — A499 Bacterial infection, unspecified: Secondary | ICD-10-CM

## 2012-05-08 DIAGNOSIS — Z3480 Encounter for supervision of other normal pregnancy, unspecified trimester: Secondary | ICD-10-CM

## 2012-05-08 DIAGNOSIS — R55 Syncope and collapse: Secondary | ICD-10-CM

## 2012-05-08 DIAGNOSIS — O34219 Maternal care for unspecified type scar from previous cesarean delivery: Secondary | ICD-10-CM

## 2012-05-08 DIAGNOSIS — O09299 Supervision of pregnancy with other poor reproductive or obstetric history, unspecified trimester: Secondary | ICD-10-CM

## 2012-05-08 DIAGNOSIS — Z609 Problem related to social environment, unspecified: Secondary | ICD-10-CM

## 2012-05-08 DIAGNOSIS — B9689 Other specified bacterial agents as the cause of diseases classified elsewhere: Secondary | ICD-10-CM

## 2012-05-08 LAB — CBC
HCT: 31.9 % — ABNORMAL LOW (ref 36.0–46.0)
Hemoglobin: 10.7 g/dL — ABNORMAL LOW (ref 12.0–15.0)
WBC: 12.1 10*3/uL — ABNORMAL HIGH (ref 4.0–10.5)

## 2012-05-08 LAB — POCT URINALYSIS DIPSTICK
Bilirubin, UA: NEGATIVE
Blood, UA: 1
Nitrite, UA: NEGATIVE
pH, UA: 5

## 2012-05-08 MED ORDER — TETANUS-DIPHTH-ACELL PERTUSSIS 5-2.5-18.5 LF-MCG/0.5 IM SUSP: 0.5000 mL | Freq: Once | INTRAMUSCULAR | Status: DC

## 2012-05-08 MED ORDER — METRONIDAZOLE 500 MG PO TABS: 500.0000 mg | ORAL_TABLET | Freq: Two times a day (BID) | ORAL | Status: AC

## 2012-05-08 NOTE — Progress Notes (Signed)
P - 76 - Pt states over the last 10 days has had presyncopal episodes with drop in blood pressure - She went for a walk yesterday and felt like she was going into labor after she was back home

## 2012-05-08 NOTE — Patient Instructions (Addendum)
Parto vaginal luego de una cesrea (Vaginal Birth After Cesarean Delivery) Un parto vaginal luego de un parto por cesrea es dar a luz por la vagina luego de haber dado a luz por medio de una intervencin [place]. En el pasado, si una mujer tena un beb por cesrea, todos los partos posteriores deban hacerse por cesrea. Esto ya no es as. Puede ser seguro para la mam intentar un parto vaginal luego de una cesrea. La decisin final de tener un parto vaginal o por cesrea debe tomarse en conjunto, entre la paciente y el mdico. [place] riesgos y los beneficios debern evaluarse con relacin a los motivos y al tipo de cesrea previa LAS MUJERES QUE QUIEREN TENER UN PARTO VAGINAL, DEBEN CONSULTAR CON SU MDICO PARA ASEGURARSE QUE:  La cesrea anterior se realiz con una incisin uterina transversal (no con una incisin vertical clsica).  El canal de parto es lo suficientemente grande como para que pase el [place].  No ha sido sometida a otras operaciones del tero.  Durante el trabajo de parto le realizarn un monitoreo electrnico fetal, en todo momento.  Es necesario que haya un quirfano disponible y listo en caso de necesitar una cesrea de emergencia.  Un cirujano y personal de quirfano estarn disponibles en todo momento durante el Anthony de parto, para realizar una cesrea en caso de ser necesario.  Habr un anestesista disponible en caso de necesitar una cesrea de emergencia.  La nursery est lista cuenta con personal especializado y el equipo disponible para cuidar al beb en caso de emergencia. BENEFICIOS  Permanencia ms breve en el hospital.  Menores costos en el parto, la nurse y el hospital.  Menos prdida de sangre y menos probabilidad de necesitar una transfusin sangunea.  Menos probabilidad de tener fiebre o molestias como consecuencia de una ciruga mayor  Menos riesgo de cogulos sanguneos.  Menos riesgo de sufrir infecciones.  Recuperacin ms rpida luego  del alta mdica.  Menos riesgo de complicaciones quirrgicas, como apertura o hernia de la incisin.  Disminucin del riesgo de lesiones a otros rganos  Menor riesgo de remocin del tero (histerectoma)  Menor riesgo de que la placenta cubra parcial o completamente la abertura del tero (placenta previa) en embarazos futuros  Posibilidad de tener una familia grande, si lo desea. RIESGOS  Ruptura del tero.  Si el tero se rompe Production manager.  Todas las complicaciones de [place] mayor y lesiones en otros rganos.  Hemorragia excesiva, cogulos e infeccin.  [place] Puntuacin Apgar baja (mtodo que evala al recin nacido segn su apariencia, pulso, muecas, actividad y respiracion) y ms riesgos para el beb.  Hay mas riesgo de ruptura del tero si se induce o aumenta el trabajo de [place].  Hay un mayor riesgo de ruptura uterina si se usan medicamentos para madurar el cuello. NO DEBE LLEVARSE A CABO SI:  La cesrea previa se realiz con una incicin vertical (clsica) o con forma de T, o usted no sabe cul de [employer] han practicado.  Ha sufrido ruptura del tero.  Le han practicado una ciruga de tero.  Tiene problemas mdicos u obsttricos.  El beb est en problemas.  Tuvo dos cesreas previas y ningn parto vaginal. OTRAS COSAS QUE DEBE SABER:  La anestesia peridural es segura.  Es seguro dar vuelta al beb si se encuentra de nalgas (intentar una versin ceflica externa).  Es seguro intentarlo en caso de mellizos.  Los embarazos de ms de 40 semanas no tienen  xito con este procedimiento.  Hay un aumento de fracasos en embarazadas obesas.  Hay un aumento del porcentaje de fracasos si el beb pesa 4 Kg o ms.  Hay aumento en el porcentaje de fracasos si el intervalo entre la operacin cesrea y el parto vaginal es de menos de 19 meses.  Hay un aumento en el porcentaje de fracasos si ha sufrido preeclampsia hipertensin arterial,  protenas en la orina e hinchazn del rostro y las extremidades.  El parto vaginal ser muy exitoso si tuvo un parto vaginal previo.  Tambin es [employer] caso que el [place] de parto comience espontneamente antes de la fecha.  El parto vaginal luego de Neomia Dear cesrea es similar a un parto espontneo vaginal normal. Es importante que converse con su mdico desde comienzos del Psychiatrist de modo que pueda [employer], beneficios y opciones. De este modo tendr tiempo de decidir que es lo mejor en su caso particular en relacin a su parto por cesrea anterior. Hay que tener en cuenta que puede haber cambios en la madre durante el [place], lo que hace necesario cambiar su decisin o la del mdico. Los consejos, preocupaciones y decisiones debern documentarse en la historia clnica y debe ser firmada por todas las partes. Document Released: 07/17/2007 Document Revised: 04/22/2011 [facility] Patient Information 2013 [place], [place]. Presncope  (Near-Syncope)  La lipotimia es una sensacin de debilidad o mareos repentinos, o la sensacin de desmayo inminente. Puede ocurrir al levantarse o cuando se permanece de pie durante mucho tiempo. La causa puede ser una cada de la presin sangunea. Es muy comn en personas que toman medicamentos para la presin sangunea. Los [employer] suelen ocurrir cuando la presin o el pulso son muy bajos. CUIDADOS EN EL HOGAR  Si siente que se va a desmayar:  Recustese enseguida.  Respire profundamente y de [place].  Espere hasta que los sntomas hayan desaparecido. La mayor parte de los episodios dura unos pocos minutos. Podr sentirse cansado por varias horas.  Beba ms lquidos si est deshidratado.  Si toma medicamentos para la presin arterial, es importante que se levante lentamente. BUSQUE AYUDA DE INMEDIATO SI:  Sufre una cefalea grave.  Dolor inusual intenso en el pecho, el abdomen, o la espalda.  Tiene sangrado por la nariz, el ano  (recto) o la materia fecal (heces).es de color negro o alquitranado.  Siente que el corazn late de [place] diferente de lo normal, o tiene el pulso muy rpido.  Se desmaya o tiembla y se sacude.  Se marea mientras se encuentra sentado o acostado.  Se siente confundido.  Tiene problemas para caminar.  Se siente muy dbil.  Presenta problemas en la visin. ASEGRESE QUE:  Comprende estas instrucciones.  Controlar su enfermedad.  Solicitar ayuda de inmediato si no mejora o empeora. Document Released: 04/26/2008 Document Revised: 04/22/2011 [facility] Patient Information 2013 [place], [place].

## 2012-05-08 NOTE — Progress Notes (Signed)
Patient is Spanish-speaking only, Spanish interpreter present for this encounter.  Normal pulse, presyncopal episodes due to low BP.  Encouraged drinking plenty of water; will also check CBC and CMET today to evaluate for other anomalies.  Denies any tachycardia or irregular heart rhythms, will hold off on ECG for now.  1 hr GTT, third trimester labs drawn today.  TDaP vaccine also drawn today.  Patient was recently evaluated in [facility]U and disgnosed with BV, she did not fill the script and has lost it.  She still reports reports the same symptoms, Metronidazole prescribed.  Udip showed large LE and blood, no nitrites and she has no urinary symptoms.  Urine culture sent, will follow up results and manage accordingly.  Discussed TOLAC vs RCS; risks discussed, patient desires TOLAC.  Spanish TOLAC consent not available, will get it and have her sign it next visit.  No other complaints or concerns.  Fetal movement and labor precautions reviewed.

## 2012-05-09 ENCOUNTER — Encounter: Payer: Self-pay | Admitting: Obstetrics & Gynecology

## 2012-05-09 LAB — HIV ANTIBODY (ROUTINE TESTING W REFLEX): HIV: NONREACTIVE

## 2012-05-09 LAB — COMPREHENSIVE METABOLIC PANEL
ALT: 18 U/L (ref 0–35)
AST: 15 U/L (ref 0–37)
Albumin: 3.2 g/dL — ABNORMAL LOW (ref 3.5–5.2)
Calcium: 8.7 mg/dL (ref 8.4–10.5)
Chloride: 104 mEq/L (ref 96–112)
Creat: 0.51 mg/dL (ref 0.50–1.10)
Potassium: 4.4 mEq/L (ref 3.5–5.3)

## 2012-05-09 LAB — GLUCOSE TOLERANCE, 1 HOUR (50G) W/O FASTING: Glucose, 1 Hour GTT: 199 mg/dL — ABNORMAL HIGH (ref 70–140)

## 2012-05-10 LAB — CULTURE, OB URINE: Organism ID, Bacteria: NO GROWTH

## 2012-05-20 ENCOUNTER — Encounter (HOSPITAL_COMMUNITY): Payer: Self-pay | Admitting: *Deleted

## 2012-05-20 ENCOUNTER — Observation Stay (HOSPITAL_COMMUNITY)
Admission: EM | Admit: 2012-05-20 | Discharge: 2012-05-21 | Disposition: A | Discharge status: Home / Self Care | Payer: No Typology Code available for payment source | Attending: Obstetrics & Gynecology | Admitting: Obstetrics & Gynecology

## 2012-05-20 DIAGNOSIS — O99810 Abnormal glucose complicating pregnancy: Secondary | ICD-10-CM

## 2012-05-20 DIAGNOSIS — Z8632 Personal history of gestational diabetes: Secondary | ICD-10-CM

## 2012-05-20 DIAGNOSIS — R109 Unspecified abdominal pain: Secondary | ICD-10-CM

## 2012-05-20 DIAGNOSIS — O99891 Other specified diseases and conditions complicating pregnancy: Principal | ICD-10-CM | POA: Insufficient documentation

## 2012-05-20 DIAGNOSIS — O4700 False labor before 37 completed weeks of gestation, unspecified trimester: Secondary | ICD-10-CM

## 2012-05-20 DIAGNOSIS — O24419 Gestational diabetes mellitus in pregnancy, unspecified control: Secondary | ICD-10-CM

## 2012-05-20 DIAGNOSIS — Y92410 Unspecified street and highway as the place of occurrence of the external cause: Secondary | ICD-10-CM | POA: Insufficient documentation

## 2012-05-20 DIAGNOSIS — O9989 Other specified diseases and conditions complicating pregnancy, childbirth and the puerperium: Secondary | ICD-10-CM

## 2012-05-20 DIAGNOSIS — Z3490 Encounter for supervision of normal pregnancy, unspecified, unspecified trimester: Secondary | ICD-10-CM

## 2012-05-20 MED ORDER — HYDROMORPHONE HCL PF 1 MG/ML IJ SOLN
1.0000 mg | INTRAMUSCULAR | Status: DC | PRN
  Administered 2012-05-20: 1 mg via INTRAVENOUS
  Filled 2012-05-20: qty 1

## 2012-05-20 MED ORDER — PRENATAL MULTIVITAMIN CH
1.0000 | ORAL_TABLET | Freq: Every day | ORAL | Status: DC
  Administered 2012-05-21: 1 via ORAL
  Filled 2012-05-20: qty 1

## 2012-05-20 MED ORDER — ACETAMINOPHEN 325 MG PO TABS: 650.0000 mg | ORAL_TABLET | ORAL | Status: DC | PRN

## 2012-05-20 MED ORDER — ZOLPIDEM TARTRATE 5 MG PO TABS: 5.0000 mg | ORAL_TABLET | Freq: Every evening | ORAL | Status: DC | PRN

## 2012-05-20 MED ORDER — CALCIUM CARBONATE ANTACID 500 MG PO CHEW: 2.0000 | CHEWABLE_TABLET | ORAL | Status: DC | PRN

## 2012-05-20 MED ORDER — LACTATED RINGERS IV BOLUS (SEPSIS)
500.0000 mL | Freq: Once | INTRAVENOUS | Status: AC
  Administered 2012-05-20: 500 mL via INTRAVENOUS

## 2012-05-20 MED ORDER — DOCUSATE SODIUM 100 MG PO CAPS
100.0000 mg | ORAL_CAPSULE | Freq: Every day | ORAL | Status: DC
  Administered 2012-05-21: 100 mg via ORAL
  Filled 2012-05-20: qty 1

## 2012-05-20 NOTE — Progress Notes (Signed)
Dr. Lynelle Doctor notified of Dr. Despina Hidden request for 4 hours of monitoring. No other orders needed.

## 2012-05-20 NOTE — ED Notes (Signed)
OB/GYN Rapid Response RN at beside
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
## 2012-05-20 NOTE — Progress Notes (Signed)
Report called and given to [facility] in MAU regarding pt being transferred.
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
## 2012-05-20 NOTE — Progress Notes (Signed)
Meal given to patient, patient sitting up eating, RN at bedside adjusting [place]
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
## 2012-05-20 NOTE — ED Notes (Signed)
Pt undressed, in gown, on continuous pulse oximetry and blood pressure cuff 

## 2012-05-20 NOTE — Progress Notes (Signed)
Dr. Despina Hidden called regarding increase in uterine irritability in last 20-30 min. Stated to transfer to MAU for observation. Dr. Lynelle Doctor notified. Orders placed to transfer

## 2012-05-20 NOTE — Progress Notes (Signed)
Pt states she is feeling better and has less pain then on arrival to ED.
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
## 2012-05-20 NOTE — Progress Notes (Signed)
Spoke with Dr. Lynelle Doctor about Uterine irritability and possible need for IV fluid bolus. Order received. bolus started
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
## 2012-05-21 LAB — GLUCOSE, CAPILLARY
Glucose-Capillary: 92 mg/dL (ref 70–99)
Glucose-Capillary: 165 mg/dL — ABNORMAL HIGH (ref 70–99)

## 2012-05-21 LAB — FIBRINOGEN: Fibrinogen: 585 mg/dL — ABNORMAL HIGH (ref 204–475)

## 2012-05-21 LAB — PROTIME-INR
INR: 1.02 (ref 0.00–1.49)
Prothrombin Time: 13.3 seconds (ref 11.6–15.2)

## 2012-05-21 LAB — KLEIHAUER-BETKE STAIN: Quantitation Fetal Hemoglobin: 0 mL

## 2012-05-21 LAB — APTT: aPTT: 30 seconds (ref 24–37)
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
## 2012-05-21 NOTE — Progress Notes (Signed)
UR completed 

## 2012-05-21 NOTE — Progress Notes (Signed)
Pt. Ready to be discharged home. All belongings are with patient. She is stable and discharge instructions have been given and the patient does not have any questions.

## 2012-05-21 NOTE — Progress Notes (Signed)
Pt reported to be newly Dx  Gestational Diabetic w/ apt at  [facility] next week. A copy of the gestational Diabetic meal plan in spanish was given to the patient to aid her in following her diet until the time of her outpatient education.  Elisabeth Cara M.Odis Luster LDN Neonatal Nutrition Support Specialist Pager [phone]

## 2012-05-28 ENCOUNTER — Encounter: Payer: Self-pay | Admitting: Obstetrics & Gynecology

## 2012-06-01 ENCOUNTER — Encounter: Payer: Self-pay | Admitting: Obstetrics & Gynecology

## 2012-06-08 ENCOUNTER — Ambulatory Visit (INDEPENDENT_AMBULATORY_CARE_PROVIDER_SITE_OTHER): Payer: Medicaid Other | Admitting: Obstetrics & Gynecology

## 2012-06-08 VITALS — BP 87/63 | Temp 98.3°F | Wt 178.0 lb

## 2012-06-08 DIAGNOSIS — O99810 Abnormal glucose complicating pregnancy: Secondary | ICD-10-CM

## 2012-06-08 DIAGNOSIS — O34219 Maternal care for unspecified type scar from previous cesarean delivery: Secondary | ICD-10-CM

## 2012-06-08 DIAGNOSIS — Z603 Acculturation difficulty: Secondary | ICD-10-CM

## 2012-06-08 DIAGNOSIS — O24419 Gestational diabetes mellitus in pregnancy, unspecified control: Secondary | ICD-10-CM

## 2012-06-08 DIAGNOSIS — Z609 Problem related to social environment, unspecified: Secondary | ICD-10-CM

## 2012-06-08 LAB — POCT URINALYSIS DIP (DEVICE)
Nitrite: NEGATIVE
Protein, ur: NEGATIVE mg/dL
pH: 7 (ref 5.0–8.0)

## 2012-06-08 NOTE — Patient Instructions (Signed)
Return to clinic for any obstetric concerns or go to MAU for evaluation  

## 2012-06-08 NOTE — Progress Notes (Signed)
Pulse: 85

## 2012-06-08 NOTE — Progress Notes (Signed)
TOLAC consent signed. Unable to meet with [name] today, appointment with [name] rescheduled to 06/11/12 for DM teaching.  No other complaints or concerns.  Fetal movement and labor precautions reviewed.

## 2012-06-11 ENCOUNTER — Ambulatory Visit (HOSPITAL_COMMUNITY): Payer: MEDICAID

## 2012-06-11 ENCOUNTER — Encounter: Payer: Self-pay | Admitting: *Deleted

## 2012-06-13 ENCOUNTER — Inpatient Hospital Stay (HOSPITAL_COMMUNITY)
Admission: AD | Admit: 2012-06-13 | Discharge: 2012-06-14 | Disposition: A | Discharge status: Home / Self Care | Payer: Medicaid Other | Source: Ambulatory Visit | Attending: Obstetrics & Gynecology | Admitting: Obstetrics & Gynecology

## 2012-06-13 DIAGNOSIS — A499 Bacterial infection, unspecified: Secondary | ICD-10-CM | POA: Insufficient documentation

## 2012-06-13 DIAGNOSIS — O2390 Unspecified genitourinary tract infection in pregnancy, unspecified trimester: Secondary | ICD-10-CM | POA: Insufficient documentation

## 2012-06-13 DIAGNOSIS — N760 Acute vaginitis: Secondary | ICD-10-CM | POA: Insufficient documentation

## 2012-06-13 DIAGNOSIS — B9689 Other specified bacterial agents as the cause of diseases classified elsewhere: Secondary | ICD-10-CM | POA: Insufficient documentation

## 2012-06-13 DIAGNOSIS — O34219 Maternal care for unspecified type scar from previous cesarean delivery: Secondary | ICD-10-CM

## 2012-06-13 DIAGNOSIS — O4703 False labor before 37 completed weeks of gestation, third trimester: Secondary | ICD-10-CM

## 2012-06-13 DIAGNOSIS — O4700 False labor before 37 completed weeks of gestation, unspecified trimester: Secondary | ICD-10-CM | POA: Insufficient documentation

## 2012-06-13 DIAGNOSIS — O99810 Abnormal glucose complicating pregnancy: Secondary | ICD-10-CM | POA: Insufficient documentation

## 2012-06-13 DIAGNOSIS — O09292 Supervision of pregnancy with other poor reproductive or obstetric history, second trimester: Secondary | ICD-10-CM

## 2012-06-13 DIAGNOSIS — O24419 Gestational diabetes mellitus in pregnancy, unspecified control: Secondary | ICD-10-CM

## 2012-06-13 LAB — URINALYSIS, ROUTINE W REFLEX MICROSCOPIC
Leukocytes, UA: NEGATIVE
Nitrite: NEGATIVE
Specific Gravity, Urine: <1.005 — ABNORMAL LOW (ref 1.005–1.030)
Urobilinogen, UA: 0.2 mg/dL (ref 0.0–1.0)
pH: 6 (ref 5.0–8.0)

## 2012-06-13 NOTE — MAU Note (Signed)
Contractions for about 2 hours and a lot of pelvic pressure

## 2012-06-14 ENCOUNTER — Encounter (HOSPITAL_COMMUNITY): Payer: Self-pay

## 2012-06-14 DIAGNOSIS — N760 Acute vaginitis: Secondary | ICD-10-CM

## 2012-06-14 DIAGNOSIS — O2390 Unspecified genitourinary tract infection in pregnancy, unspecified trimester: Secondary | ICD-10-CM

## 2012-06-14 DIAGNOSIS — A499 Bacterial infection, unspecified: Secondary | ICD-10-CM

## 2012-06-14 DIAGNOSIS — O4700 False labor before 37 completed weeks of gestation, unspecified trimester: Secondary | ICD-10-CM

## 2012-06-14 LAB — OB RESULTS CONSOLE GC/CHLAMYDIA: Chlamydia: NEGATIVE

## 2012-06-14 LAB — GLUCOSE, CAPILLARY

## 2012-06-14 LAB — WET PREP, GENITAL
Trich, Wet Prep: NONE SEEN
Yeast Wet Prep HPF POC: NONE SEEN

## 2012-06-14 MED ORDER — NITROFURANTOIN MONOHYD MACRO 100 MG PO CAPS: 100.0000 mg | ORAL_CAPSULE | Freq: Two times a day (BID) | ORAL | Status: DC

## 2012-06-14 MED ORDER — METRONIDAZOLE 500 MG PO TABS: 500.0000 mg | ORAL_TABLET | Freq: Two times a day (BID) | ORAL | Status: DC

## 2012-06-14 NOTE — MAU Provider Note (Signed)
History     CSN: [id number]  Arrival date and time: 06/13/12 2337   First Provider Initiated Contact with Patient 06/14/12 0055      Chief Complaint  Patient presents with  . Contractions  . Pelvic Pain   HPI Vanessa Henson is a 27 y.o. [phone] female at [redacted]w[redacted]d who presents w/ report of uc's since 2130.  Reports good fm.  Denies vb or lof.  Also reports increased urinary frequency today w/ dysuria and urgency x 4 days.  Increased white non-odorous d/c w/o vulvovaginal itching/irritation.  Dx w/ A1DM, hasn't been checking sugars at home- meeting w/ diabetic educator 5/14 per pt. Next appt @ [facility] on Mond.  OB History   Grav Para Term Preterm Abortions TAB SAB Ect Mult Living   4 2 2  1  1   2       Past Medical History  Diagnosis Date  . Renal disorder     kidney stones  . Gall stones   . Gestational diabetes 2010    Past Surgical History  Procedure Laterality Date  . Cesarean section    . Eye surgery      Family History  Problem Relation Age of Onset  . Diabetes Mother   . Diabetes Father     History  Substance Use Topics  . Smoking status: Never Smoker   . Smokeless tobacco: Not on file  . Alcohol Use: No    Allergies:  Allergies  Allergen Reactions  . Ibuprofen Shortness Of Breath  . Diphenhydramine Hcl Other (See Comments)    Dizziness and shortness of breath.    Prescriptions prior to admission  Medication Sig Dispense Refill  . Prenatal Vit-Fe Fumarate-FA (MULTIVITAMIN-PRENATAL) 27-0.8 MG TABS Take 1 tablet by mouth daily.        Review of Systems  Constitutional: Negative.  Negative for fever and chills.  HENT: Negative.   Eyes: Negative.   Respiratory: Negative.   Cardiovascular: Negative.   Gastrointestinal: Positive for abdominal pain (r/t uc's).  Genitourinary: Positive for dysuria, urgency and frequency. Negative for hematuria and flank pain.  Musculoskeletal: Negative.   Skin: Negative.   Neurological: Negative.    Endo/Heme/Allergies: Negative.   Psychiatric/Behavioral: Negative.    Physical Exam   Blood pressure 110/63, pulse 90, temperature 98.6 F (37 C), resp. rate 18, height 5' (1.524 m), weight 81.829 kg (180 lb 6.4 oz), SpO2 100.00%.  Physical Exam  Constitutional: She is oriented to person, place, and time. She appears well-developed and well-nourished.  HENT:  Head: Normocephalic.  Neck: Normal range of motion.  Cardiovascular: Normal rate.   Respiratory: Effort normal.  GI: Soft.  gravid   Genitourinary:  Rt labia w/ multiple varicosities Large bulbous area of tissue at anterior vaginal wall protruding from introitus- strawberry appearance, states has had since birth of last child- also examined by muhammad, cnm and dr. Macon Large- who states it is just part of the anterior vaginal wall  Spec exam: mod amount frothy white malodorous d/c, cx visually closed, fFN, wet prep, and gc/ch collected  SVE: ft/th/high  Musculoskeletal: Normal range of motion.  Neurological: She is alert and oriented to person, place, and time.  Skin: Skin is warm and dry.  Psychiatric: She has a normal mood and affect. Her behavior is normal. Judgment and thought content normal.   FHR: 125, mod variability, 15x15accels, no decels UCs: mild, irregular  MAU Course  Procedures  EFM UA, C&S Spec exam w/ fFN, wet prep, gc/ch SVE x  2- no cervical change  Results for orders placed during the hospital encounter of 06/13/12 (from the past 24 hour(s))  URINALYSIS, ROUTINE W REFLEX MICROSCOPIC     Status: Abnormal   Collection Time    06/13/12 11:52 PM      Result Value Range   Color, Urine YELLOW  YELLOW   APPearance CLEAR  CLEAR   Specific Gravity, Urine <1.005 (*) 1.005 - 1.030   pH 6.0  5.0 - 8.0   Glucose, UA 250 (*) NEGATIVE mg/dL   Hgb urine dipstick NEGATIVE  NEGATIVE   Bilirubin Urine NEGATIVE  NEGATIVE   Ketones, ur NEGATIVE  NEGATIVE mg/dL   Protein, ur NEGATIVE  NEGATIVE mg/dL    Urobilinogen, UA 0.2  0.0 - 1.0 mg/dL   Nitrite NEGATIVE  NEGATIVE   Leukocytes, UA NEGATIVE  NEGATIVE  FETAL FIBRONECTIN     Status: None   Collection Time    06/14/12  1:08 AM      Result Value Range   Fetal Fibronectin NEGATIVE  NEGATIVE  WET PREP, GENITAL     Status: Abnormal   Collection Time    06/14/12  1:08 AM      Result Value Range   Yeast Wet Prep HPF POC NONE SEEN  NONE SEEN   Trich, Wet Prep NONE SEEN  NONE SEEN   Clue Cells Wet Prep HPF POC FEW (*) NONE SEEN   WBC, Wet Prep HPF POC FEW (*) NONE SEEN  GLUCOSE, CAPILLARY     Status: Abnormal   Collection Time    06/14/12  1:34 AM      Result Value Range   Glucose-Capillary 122 (*) 70 - 99 mg/dL   Comment 1 Notify RN      Assessment and Plan  A:  [redacted]w[redacted]d SIUP  Cat I FHR  A1DM- appt w/ diabetic educator on 5/14 per pt  Possible uti- urine wnl, but reports symptoms  Preterm uc's w/o cervical change  BV  Prev c/s- desires vbac  Labial varicosities  Anterior vaginal wall protrusion   P:  D/C home  Keep appt Mon in [facility] as scheduled  Rx Macrobid 100mg  po bid x 7d  Rx Metronidazole 500mg  po bid x 7d  Increase po fluid intake  Reviewed ptl s/s and fetal kick counts  Marge Duncans 06/14/2012, 12:55 AM

## 2012-06-14 NOTE — MAU Provider Note (Signed)
Attestation of Attending Supervision of Advanced Practitioner (PA/CNM/NP): Evaluation and management procedures were performed by the Advanced Practitioner under my supervision and collaboration.  I have reviewed the Advanced Practitioner's note and chart, and I agree with the management and plan.  Jakeline Dave, MD, FACOG Attending Obstetrician & Gynecologist Faculty Practice, [facility]  

## 2012-06-15 ENCOUNTER — Ambulatory Visit (INDEPENDENT_AMBULATORY_CARE_PROVIDER_SITE_OTHER): Payer: Medicaid Other | Admitting: Obstetrics & Gynecology

## 2012-06-15 ENCOUNTER — Encounter: Payer: Medicaid Other | Attending: Obstetrics & Gynecology | Admitting: Dietician

## 2012-06-15 VITALS — BP 94/58 | Temp 97.6°F | Wt 177.1 lb

## 2012-06-15 DIAGNOSIS — O99810 Abnormal glucose complicating pregnancy: Secondary | ICD-10-CM | POA: Insufficient documentation

## 2012-06-15 DIAGNOSIS — O24419 Gestational diabetes mellitus in pregnancy, unspecified control: Secondary | ICD-10-CM

## 2012-06-15 DIAGNOSIS — Z713 Dietary counseling and surveillance: Secondary | ICD-10-CM | POA: Insufficient documentation

## 2012-06-15 LAB — POCT URINALYSIS DIP (DEVICE)
Bilirubin Urine: NEGATIVE
Glucose, UA: NEGATIVE mg/dL
Hgb urine dipstick: NEGATIVE
Nitrite: NEGATIVE
Specific Gravity, Urine: 1.025 (ref 1.005–1.030)

## 2012-06-15 NOTE — Progress Notes (Signed)
Pulse: 80 Needs to see [name]. Has not yet started checking blood sugars. Will return in 1 week to see doctor.

## 2012-06-15 NOTE — Progress Notes (Signed)
Diabetes Education:  Seen today for GDM teaching.  Has a history of GDM in the last pregnancy.  Review of the basic physiology of GDM, influence of food, stress on blood glucose. Review of the need for exercise as she can.  Recommended the 30 minutes of walking daily.  Review of the S/S of hypoglycemia and its treatment.  Provided a True Track Meter and review of blood glucose monitoring.  Instructed to monitor fasting and 2 hr PP blood glucose levels. Instructed to record and bring her meter and log book to all clinic appointments.  On return demonstration of BGM, her glucose was 104 for fasting at 8:30 AM.  Provided Handouts in Spanish "Nutrition Diabetes and Pregnancy" and "Carbohydrate Counting".  Vanessa Dvid Pendry, RN, RD, LDN, CDE

## 2012-06-15 NOTE — Progress Notes (Signed)
Nutrition note: 1st visit consult/ GDM education Pt has GDM. Pt has gained 27.1# @ [redacted]w[redacted]d, which is > expected. Pt reports eating 2-3 meals & 4 snacks/d. Pt is taking PNV.  Pt reports having nausea but no heartburn. NKFA. Pt reports walking 3-4x/wk for 30 mins. Pt received verbal & written education in Spanish on GDM diet. Disc tips to decrease nausea. Disc wt gain goals of 15-25# or 0.6#/wk. Pt agrees to follow GDM diet with 3 meals & 3 snacks/d and proper CHO/ protein combination. Pt has WIC & plans to BF. F/u in 2-4 wks Blondell Reveal, MS, RD, LDN

## 2012-06-22 ENCOUNTER — Other Ambulatory Visit: Payer: Self-pay | Admitting: Obstetrics & Gynecology

## 2012-06-22 ENCOUNTER — Ambulatory Visit (INDEPENDENT_AMBULATORY_CARE_PROVIDER_SITE_OTHER): Payer: Medicaid Other | Admitting: Obstetrics & Gynecology

## 2012-06-22 VITALS — BP 98/59 | Temp 98.1°F | Wt 176.9 lb

## 2012-06-22 DIAGNOSIS — O24419 Gestational diabetes mellitus in pregnancy, unspecified control: Secondary | ICD-10-CM

## 2012-06-22 DIAGNOSIS — O99810 Abnormal glucose complicating pregnancy: Secondary | ICD-10-CM

## 2012-06-22 LAB — POCT URINALYSIS DIP (DEVICE)
Bilirubin Urine: NEGATIVE
Glucose, UA: NEGATIVE mg/dL
Hgb urine dipstick: NEGATIVE
Ketones, ur: NEGATIVE mg/dL
Nitrite: NEGATIVE
pH: 6.5 (ref 5.0–8.0)

## 2012-06-22 MED ORDER — GLYBURIDE 2.5 MG PO TABS: 2.5000 mg | ORAL_TABLET | Freq: Two times a day (BID) | ORAL | Status: DC

## 2012-06-22 NOTE — Progress Notes (Signed)
Pulse: 78

## 2012-06-22 NOTE — Patient Instructions (Signed)
Parto vaginal luego de una cesrea (Vaginal Birth After Cesarean Delivery) Un parto vaginal luego de un parto por cesrea es dar a luz por la vagina luego de haber dado a luz por medio de una intervencin [place]. En el pasado, si una mujer tena un beb por cesrea, todos los partos posteriores deban hacerse por cesrea. Esto ya no es as. Puede ser seguro para la mam intentar un parto vaginal luego de una cesrea. La decisin final de tener un parto vaginal o por cesrea debe tomarse en conjunto, entre la paciente y el mdico. [place] riesgos y los beneficios debern evaluarse con relacin a los motivos y al tipo de cesrea previa LAS MUJERES QUE QUIEREN TENER UN PARTO VAGINAL, DEBEN CONSULTAR CON SU MDICO PARA ASEGURARSE QUE:  La cesrea anterior se realiz con una incisin uterina transversal (no con una incisin vertical clsica).  El canal de parto es lo suficientemente grande como para que pase el [place].  No ha sido sometida a otras operaciones del tero.  Durante el trabajo de parto le realizarn un monitoreo electrnico fetal, en todo momento.  Es necesario que haya un quirfano disponible y listo en caso de necesitar una cesrea de emergencia.  Un cirujano y personal de quirfano estarn disponibles en todo momento durante el [place] de parto, para realizar una cesrea en caso de ser necesario.  Habr un anestesista disponible en caso de necesitar una cesrea de emergencia.  La nursery est lista cuenta con personal especializado y el equipo disponible para cuidar al beb en caso de emergencia. BENEFICIOS  Permanencia ms breve en el hospital.  Menores costos en el parto, la nurse y el hospital.  Menos prdida de sangre y menos probabilidad de necesitar una transfusin sangunea.  Menos probabilidad de tener fiebre o molestias como consecuencia de una ciruga mayor  Menos riesgo de cogulos sanguneos.  Menos riesgo de sufrir infecciones.  Recuperacin ms rpida luego  del alta mdica.  Menos riesgo de complicaciones quirrgicas, como apertura o hernia de la incisin.  Disminucin del riesgo de lesiones a otros rganos  Menor riesgo de remocin del tero (histerectoma)  Menor riesgo de que la placenta cubra parcial o completamente la abertura del tero (placenta previa) en embarazos futuros  Posibilidad de tener una familia grande, si lo desea. RIESGOS  Ruptura del tero.  Si el tero se rompe Production manager.  Todas las complicaciones de [place] mayor y lesiones en otros rganos.  Hemorragia excesiva, cogulos e infeccin.  [place] Apgar Puntuacin Apgar baja (mtodo que evala al recin nacido segn su apariencia, pulso, muecas, actividad y respiracion) y ms riesgos para el beb.  Hay mas riesgo de ruptura del tero si se induce o aumenta el trabajo de [place].  Hay un mayor riesgo de ruptura uterina si se usan medicamentos para madurar el cuello. NO DEBE LLEVARSE A CABO SI:  La cesrea previa se realiz con una incicin vertical (clsica) o con forma de T, o usted no sabe cul de [employer] han practicado.  Ha sufrido ruptura del tero.  Le han practicado una ciruga de tero.  Tiene problemas mdicos u obsttricos.  El beb est en problemas.  Tuvo dos cesreas previas y ningn parto vaginal. OTRAS COSAS QUE DEBE SABER:  La anestesia peridural es segura.  Es seguro dar vuelta al beb si se encuentra de nalgas (intentar una versin ceflica externa).  Es seguro intentarlo en caso de mellizos.  Los embarazos de ms de 40 semanas no tienen  xito con este procedimiento.  Hay un aumento de fracasos en embarazadas obesas.  Hay un aumento del porcentaje de fracasos si el beb pesa 4 Kg o ms.  Hay aumento en el porcentaje de fracasos si el intervalo entre la operacin cesrea y el parto vaginal es de menos de 19 meses.  Hay un aumento en el porcentaje de fracasos si ha sufrido preeclampsia hipertensin arterial,  protenas en la orina e hinchazn del rostro y las extremidades.  El parto vaginal ser muy exitoso si tuvo un parto vaginal previo.  Tambin es [employer] caso que el [place] de parto comience espontneamente antes de la fecha.  El parto vaginal luego de Neomia Dear cesrea es similar a un parto espontneo vaginal normal. Es importante que converse con su mdico desde comienzos del Psychiatrist de modo que pueda [employer], beneficios y opciones. De este modo tendr tiempo de decidir que es lo mejor en su caso particular en relacin a su parto por cesrea anterior. Hay que tener en cuenta que puede haber cambios en la madre durante el [place], lo que hace necesario cambiar su decisin o la del mdico. Los consejos, preocupaciones y decisiones debern documentarse en la historia clnica y debe ser firmada por todas las partes. Document Released: 07/17/2007 Document Revised: 04/22/2011 [facility] Patient Information 2013 [place], [place].

## 2012-06-22 NOTE — Progress Notes (Signed)
FBS 99-126,  Breakfast 134-201, lunch 212, dinner 109-172. Will start glyburide 2.5 mg am and PM NST today reactive

## 2012-06-22 NOTE — Progress Notes (Signed)
U/S scheduled 06/25/12 at 845 am.

## 2012-06-25 ENCOUNTER — Ambulatory Visit (HOSPITAL_COMMUNITY): Payer: MEDICAID

## 2012-06-25 ENCOUNTER — Ambulatory Visit (HOSPITAL_COMMUNITY)
Admission: RE | Admit: 2012-06-25 | Discharge: 2012-06-25 | Disposition: A | Discharge status: Home / Self Care | Payer: Self-pay | Source: Ambulatory Visit | Attending: Obstetrics & Gynecology | Admitting: Obstetrics & Gynecology

## 2012-06-25 ENCOUNTER — Encounter: Payer: Medicaid Other | Admitting: Dietician

## 2012-06-25 ENCOUNTER — Ambulatory Visit (HOSPITAL_COMMUNITY)
Admission: RE | Admit: 2012-06-25 | Discharge: 2012-06-25 | Disposition: A | Discharge status: Home / Self Care | Payer: MEDICAID | Source: Ambulatory Visit | Attending: Obstetrics & Gynecology | Admitting: Obstetrics & Gynecology

## 2012-06-25 VITALS — BP 102/56 | HR 75 | Wt 178.0 lb

## 2012-06-25 DIAGNOSIS — O99810 Abnormal glucose complicating pregnancy: Secondary | ICD-10-CM | POA: Insufficient documentation

## 2012-06-25 DIAGNOSIS — O24419 Gestational diabetes mellitus in pregnancy, unspecified control: Secondary | ICD-10-CM

## 2012-06-25 DIAGNOSIS — O09292 Supervision of pregnancy with other poor reproductive or obstetric history, second trimester: Secondary | ICD-10-CM

## 2012-06-25 DIAGNOSIS — O34219 Maternal care for unspecified type scar from previous cesarean delivery: Secondary | ICD-10-CM

## 2012-06-25 DIAGNOSIS — Z3689 Encounter for other specified antenatal screening: Secondary | ICD-10-CM | POA: Insufficient documentation

## 2012-06-25 DIAGNOSIS — Z8632 Personal history of gestational diabetes: Secondary | ICD-10-CM

## 2012-06-25 NOTE — ED Notes (Signed)
Diabetes Education F/U: 06/25/2012  G4P2 lady at 32 weeks with EDD of 08/26/2012; seen today for blood glucose monitoring evaluation.  Started her Glyburide 2.5 mg AM and PM on Tuesday (06/23/2012).  Results:     5/13 Fasting:105 2 hr pp BK: 120 2 hr pp Lunch: 150 2 hr pp Dinner: 150     5/14    108          123               140     131      5/15    78          145 Levels are improving from previous numbers.  Review the need to take at about the same times each day.  To have a bedtime snack.  Try to have the breakfast by 9:00 AM.   Have a protein and carb source at bedtime.  Will check her glucose levels in clinic on Monday 5/19.  [name] Ovide Dusek, RN, RD, LDN, CDE

## 2012-06-29 ENCOUNTER — Encounter: Payer: Self-pay | Admitting: Obstetrics and Gynecology

## 2012-06-29 ENCOUNTER — Ambulatory Visit (INDEPENDENT_AMBULATORY_CARE_PROVIDER_SITE_OTHER): Payer: Medicaid Other | Admitting: Obstetrics and Gynecology

## 2012-06-29 VITALS — BP 99/49 | Wt 177.1 lb

## 2012-06-29 DIAGNOSIS — O09292 Supervision of pregnancy with other poor reproductive or obstetric history, second trimester: Secondary | ICD-10-CM

## 2012-06-29 DIAGNOSIS — O34219 Maternal care for unspecified type scar from previous cesarean delivery: Secondary | ICD-10-CM

## 2012-06-29 DIAGNOSIS — O09299 Supervision of pregnancy with other poor reproductive or obstetric history, unspecified trimester: Secondary | ICD-10-CM

## 2012-06-29 DIAGNOSIS — O99810 Abnormal glucose complicating pregnancy: Secondary | ICD-10-CM

## 2012-06-29 DIAGNOSIS — O24419 Gestational diabetes mellitus in pregnancy, unspecified control: Secondary | ICD-10-CM

## 2012-06-29 DIAGNOSIS — Z609 Problem related to social environment, unspecified: Secondary | ICD-10-CM

## 2012-06-29 DIAGNOSIS — Z603 Acculturation difficulty: Secondary | ICD-10-CM

## 2012-06-29 LAB — POCT URINALYSIS DIP (DEVICE)
Bilirubin Urine: NEGATIVE
Ketones, ur: NEGATIVE mg/dL
Protein, ur: 30 mg/dL — AB
Specific Gravity, Urine: 1.025 (ref 1.005–1.030)

## 2012-06-29 NOTE — Progress Notes (Signed)
P-[id number] 

## 2012-06-29 NOTE — Progress Notes (Signed)
NST reviewed and reactive. 5/15 EFW 78%tile. CBGs better controlled on glyburide. A few post lunch in 160's but majority within range. Patient reports onset of pruritis 2 days after taking glyburide and has not been taking it consistently. She is using and anti-poison oak spray which is helping some. Will check bile acids today. FM/PTL precautions reviewed

## 2012-07-01 LAB — BILE ACIDS, TOTAL: Bile Acids Total: 16 umol/L (ref 0–19)

## 2012-07-02 ENCOUNTER — Other Ambulatory Visit: Payer: Self-pay | Admitting: Obstetrics and Gynecology

## 2012-07-02 ENCOUNTER — Encounter: Payer: Self-pay | Admitting: *Deleted

## 2012-07-02 DIAGNOSIS — K831 Obstruction of bile duct: Secondary | ICD-10-CM | POA: Insufficient documentation

## 2012-07-02 DIAGNOSIS — O26613 Liver and biliary tract disorders in pregnancy, third trimester: Secondary | ICD-10-CM | POA: Insufficient documentation

## 2012-07-02 MED ORDER — URSODIOL 500 MG PO TABS: 500.0000 mg | ORAL_TABLET | Freq: Two times a day (BID) | ORAL | Status: DC

## 2012-07-02 NOTE — Progress Notes (Signed)
NST 5/12 reactive as noted

## 2012-07-03 ENCOUNTER — Telehealth: Payer: Self-pay

## 2012-07-03 NOTE — Telephone Encounter (Signed)
Called pt with Vanessa Henson and informed pt of what she has abnormal value and dx of cholestasis in pregnancy which explains her itching.  And that an Rx of Actigall was sent to her pharmacy and to start taking immediately.  Verified pharmacy.  Pt stated understanding.

## 2012-07-03 NOTE — Telephone Encounter (Signed)
Message copied by Faythe Casa on Fri Jul 03, 2012 10:04 AM ------      Message from: CONSTANT, PEGGY      Created: Thu Jul 02, 2012  4:43 PM       Please inform patient of abnormal lab value and diagnosis of cholestasis of pregnancy which explains her pruritis.      Rx Actigall has been e-prescribed and to be started immediately            Peggy ------

## 2012-07-06 ENCOUNTER — Inpatient Hospital Stay (HOSPITAL_COMMUNITY)
Admission: AD | Admit: 2012-07-06 | Discharge: 2012-07-09 | DRG: 774 | Disposition: A | Discharge status: Home / Self Care | Payer: Medicaid Other | Source: Ambulatory Visit | Attending: Obstetrics & Gynecology | Admitting: Obstetrics & Gynecology

## 2012-07-06 ENCOUNTER — Encounter (HOSPITAL_COMMUNITY): Payer: Self-pay | Admitting: *Deleted

## 2012-07-06 DIAGNOSIS — O34219 Maternal care for unspecified type scar from previous cesarean delivery: Secondary | ICD-10-CM | POA: Diagnosis present

## 2012-07-06 DIAGNOSIS — O09299 Supervision of pregnancy with other poor reproductive or obstetric history, unspecified trimester: Secondary | ICD-10-CM

## 2012-07-06 DIAGNOSIS — E119 Type 2 diabetes mellitus without complications: Secondary | ICD-10-CM | POA: Diagnosis present

## 2012-07-06 DIAGNOSIS — O2432 Unspecified pre-existing diabetes mellitus in childbirth: Secondary | ICD-10-CM | POA: Diagnosis present

## 2012-07-06 DIAGNOSIS — O26613 Liver and biliary tract disorders in pregnancy, third trimester: Secondary | ICD-10-CM

## 2012-07-06 DIAGNOSIS — O26619 Liver and biliary tract disorders in pregnancy, unspecified trimester: Secondary | ICD-10-CM | POA: Diagnosis present

## 2012-07-06 DIAGNOSIS — K838 Other specified diseases of biliary tract: Secondary | ICD-10-CM | POA: Diagnosis present

## 2012-07-06 DIAGNOSIS — O4290 Premature rupture of membranes, unspecified as to length of time between rupture and onset of labor, unspecified weeks of gestation: Principal | ICD-10-CM | POA: Diagnosis present

## 2012-07-06 DIAGNOSIS — O24419 Gestational diabetes mellitus in pregnancy, unspecified control: Secondary | ICD-10-CM

## 2012-07-06 LAB — CBC
HCT: 30.5 % — ABNORMAL LOW (ref 36.0–46.0)
MCHC: 33.8 g/dL (ref 30.0–36.0)
MCV: 83.6 fL (ref 78.0–100.0)
Platelets: 264 10*3/uL (ref 150–400)
RDW: 13.7 % (ref 11.5–15.5)
WBC: 10.7 10*3/uL — ABNORMAL HIGH (ref 4.0–10.5)

## 2012-07-06 LAB — PREPARE RBC (CROSSMATCH)

## 2012-07-06 LAB — GROUP B STREP BY PCR: Group B strep by PCR: NEGATIVE

## 2012-07-06 MED ORDER — LACTATED RINGERS IV SOLN: 500.0000 mL | Freq: Once | INTRAVENOUS | Status: DC

## 2012-07-06 MED ORDER — OXYTOCIN 40 UNITS IN LACTATED RINGERS INFUSION - SIMPLE MED
1.0000 m[IU]/min | INTRAVENOUS | Status: DC
  Administered 2012-07-07: 2 m[IU]/min via INTRAVENOUS
  Filled 2012-07-06: qty 1000

## 2012-07-06 MED ORDER — URSODIOL 500 MG PO TABS
500.0000 mg | ORAL_TABLET | Freq: Two times a day (BID) | ORAL | Status: DC
  Administered 2012-07-07 (×2): 500 mg via ORAL
  Filled 2012-07-06: qty 1

## 2012-07-06 MED ORDER — PHENYLEPHRINE 40 MCG/ML (10ML) SYRINGE FOR IV PUSH (FOR BLOOD PRESSURE SUPPORT)
80.0000 ug | PREFILLED_SYRINGE | INTRAVENOUS | Status: DC | PRN
  Filled 2012-07-06: qty 2

## 2012-07-06 MED ORDER — CITRIC ACID-SODIUM CITRATE 334-500 MG/5ML PO SOLN: 30.0000 mL | ORAL | Status: DC | PRN

## 2012-07-06 MED ORDER — NALBUPHINE SYRINGE 5 MG/0.5 ML
5.0000 mg | INJECTION | INTRAMUSCULAR | Status: DC | PRN
  Filled 2012-07-06: qty 0.5

## 2012-07-06 MED ORDER — FENTANYL 2.5 MCG/ML BUPIVACAINE 1/10 % EPIDURAL INFUSION (WH - ANES)
14.0000 mL/h | INTRAMUSCULAR | Status: DC | PRN
  Administered 2012-07-07: 14 mL/h via EPIDURAL
  Filled 2012-07-06: qty 125

## 2012-07-06 MED ORDER — PENICILLIN G POTASSIUM 5000000 UNITS IJ SOLR
5.0000 10*6.[IU] | Freq: Once | INTRAVENOUS | Status: AC
  Administered 2012-07-06: 5 10*6.[IU] via INTRAVENOUS
  Filled 2012-07-06: qty 5

## 2012-07-06 MED ORDER — EPHEDRINE 5 MG/ML INJ
10.0000 mg | INTRAVENOUS | Status: DC | PRN
  Filled 2012-07-06: qty 2
  Filled 2012-07-06: qty 4

## 2012-07-06 MED ORDER — LACTATED RINGERS IV SOLN
INTRAVENOUS | Status: DC
  Administered 2012-07-06 – 2012-07-07 (×3): via INTRAVENOUS

## 2012-07-06 MED ORDER — OXYTOCIN 40 UNITS IN LACTATED RINGERS INFUSION - SIMPLE MED: 62.5000 mL/h | INTRAVENOUS | Status: DC

## 2012-07-06 MED ORDER — LIDOCAINE HCL (PF) 1 % IJ SOLN
30.0000 mL | INTRAMUSCULAR | Status: DC | PRN
  Filled 2012-07-06 (×2): qty 30

## 2012-07-06 MED ORDER — ONDANSETRON HCL 4 MG/2ML IJ SOLN: 4.0000 mg | Freq: Four times a day (QID) | INTRAMUSCULAR | Status: DC | PRN

## 2012-07-06 MED ORDER — OXYCODONE-ACETAMINOPHEN 5-325 MG PO TABS: 1.0000 | ORAL_TABLET | ORAL | Status: DC | PRN

## 2012-07-06 MED ORDER — PENICILLIN G POTASSIUM 5000000 UNITS IJ SOLR
2.5000 10*6.[IU] | INTRAVENOUS | Status: DC
  Filled 2012-07-06 (×2): qty 2.5

## 2012-07-06 MED ORDER — EPHEDRINE 5 MG/ML INJ
10.0000 mg | INTRAVENOUS | Status: DC | PRN
  Filled 2012-07-06: qty 2

## 2012-07-06 MED ORDER — IBUPROFEN 600 MG PO TABS: 600.0000 mg | ORAL_TABLET | Freq: Four times a day (QID) | ORAL | Status: DC | PRN

## 2012-07-06 MED ORDER — DIPHENHYDRAMINE HCL 50 MG/ML IJ SOLN: 12.5000 mg | INTRAMUSCULAR | Status: DC | PRN

## 2012-07-06 MED ORDER — PHENYLEPHRINE 40 MCG/ML (10ML) SYRINGE FOR IV PUSH (FOR BLOOD PRESSURE SUPPORT)
80.0000 ug | PREFILLED_SYRINGE | INTRAVENOUS | Status: DC | PRN
  Filled 2012-07-06: qty 2
  Filled 2012-07-06: qty 5

## 2012-07-06 MED ORDER — OXYTOCIN BOLUS FROM INFUSION
500.0000 mL | INTRAVENOUS | Status: DC
  Administered 2012-07-07: 500 mL via INTRAVENOUS

## 2012-07-06 MED ORDER — TERBUTALINE SULFATE 1 MG/ML IJ SOLN: 0.2500 mg | Freq: Once | INTRAMUSCULAR | Status: AC | PRN

## 2012-07-06 MED ORDER — LACTATED RINGERS IV SOLN
500.0000 mL | INTRAVENOUS | Status: DC | PRN
  Administered 2012-07-07: 500 mL via INTRAVENOUS

## 2012-07-06 MED ORDER — ACETAMINOPHEN 325 MG PO TABS: 650.0000 mg | ORAL_TABLET | ORAL | Status: DC | PRN

## 2012-07-06 NOTE — Progress Notes (Signed)
Bedside US done baby vertex

## 2012-07-06 NOTE — Progress Notes (Signed)
   Erielle Gawronski is a 27 y.o. [phone] at [redacted]w[redacted]d  admitted for PROM  Subjective:  Mild contractions Objective: BP 110/57  Pulse 79  Temp(Src) 98.6 F (37 C) (Oral)  Resp 20  Ht 5' (1.524 m)  Wt 81.285 kg (179 lb 3.2 oz)  BMI 35 kg/m2    FHT:  FHR: 150 bpm, variability: moderate,  accelerations:  Present,  decelerations:  Absent UC:   irregular, every 1-5 minutes SVE:   Dilation: 1.5 Effacement (%): 50 Station: -2 Exam by:: F Cres-Dishmon, CNM aFoley inserted and inflated with 60cc H20 Labs: Lab Results  Component Value Date   WBC 10.7* 07/06/2012   HGB 10.3* 07/06/2012   HCT 30.5* 07/06/2012   MCV 83.6 07/06/2012   PLT 264 07/06/2012    Assessment / Plan: PROM, TOLAC (1 VBAC)  will start Pitocin after Foley falls out  Labor: ripening phase Fetal Wellbeing:  Category I Pain Control:  Labor support without medications Anticipated MOD:  NSVD  CRESENZO-DISHMAN,Forney Kleinpeter 07/06/2012, 11:34 PM

## 2012-07-06 NOTE — H&P (Signed)
Winry Egnew is a 27 y.o. female [phone] with IUP at [redacted]w[redacted]d presenting for PPROM. Pt states she has been having occasional contractions, associated with none vaginal bleeding.  Membranes are ruptured, clear fluid, with active fetal movement.   [facility] 17 wks  Prenatal History/Complications: A2DM (glyburide) Previous C/S with successful VBAC Cholestasis Past Medical History: Past Medical History  Diagnosis Date  . Renal disorder     kidney stones  . Gall stones   . Gestational diabetes 2010    Past Surgical History: Past Surgical History  Procedure Laterality Date  . Cesarean section    . Eye surgery      Obstetrical History: OB History   Grav Para Term Preterm Abortions TAB SAB Ect Mult Living   4 2 2  1  1   2        Social History: History   Social History  . Marital Status: Married    Spouse Name: N/A    Number of Children: N/A  . Years of Education: N/A   Social History Main Topics  . Smoking status: Never Smoker   . Smokeless tobacco: Never Used  . Alcohol Use: No  . Drug Use: No  . Sexually Active: Yes    Birth Control/ Protection: None   Other Topics Concern  . None   Social History Narrative  . None    Family History: Family History  Problem Relation Age of Onset  . Diabetes Mother   . Diabetes Father     Allergies: Allergies  Allergen Reactions  . Diphenhydramine Hcl Other (See Comments)    Dizziness and shortness of breath.    Prescriptions prior to admission  Medication Sig Dispense Refill  . glyBURIDE (DIABETA) 2.5 MG tablet Take 1 tablet (2.5 mg total) by mouth 2 (two) times daily with a meal.  60 tablet  3  . Prenatal Vit-Fe Fumarate-FA (PRENATAL MULTIVITAMIN) TABS Take 1 tablet by mouth daily at 12 noon.      . ursodiol (ACTIGALL) 500 MG tablet Take 1 tablet (500 mg total) by mouth 2 (two) times daily.  60 tablet  3     Review of Systems   Constitutional: Negative for fever, chills, weight loss,  malaise/fatigue and diaphoresis.  HENT: Negative for hearing loss, ear pain, nosebleeds, congestion, sore throat, neck pain, tinnitus and ear discharge.   Eyes: Negative for blurred vision, double vision, photophobia, pain, discharge and redness.  Respiratory: Negative for cough, hemoptysis, sputum production, shortness of breath, wheezing and stridor.   Cardiovascular: Negative for chest pain, palpitations, orthopnea,  leg swelling  Gastrointestinal: Negative for abdominalmpain, heartburn, nausea, vomiting, diarrhea, constipation, blood in stool Genitourinary: Negative for dysuria, urgency, frequency, hematuria and flank pain.  Musculoskeletal: Negative for myalgias, back pain, joint pain and falls.  Skin: Negative for itching and rash.  Neurological: Negative for dizziness, tingling, tremors, sensory change, speech change, focal weakness, seizures, loss of consciousness, weakness and headaches.  Endo/Heme/Allergies: Negative for environmental allergies and polydipsia. Does not bruise/bleed easily.  Psychiatric/Behavioral: Negative for depression, suicidal ideas, hallucinations, memory loss and substance abuse. The patient is not nervous/anxious and does not have insomnia.       Blood pressure 110/57, pulse 79, temperature 98.6 F (37 C), temperature source Oral, resp. rate 20, height 5' (1.524 m), weight 81.285 kg (179 lb 3.2 oz). General appearance: alert, cooperative and no distress Lungs: clear to auscultation bilaterally Heart: regular rate and rhythm Abdomen: soft, non-tender; bowel sounds normal Extremities: Homans sign  is negative, no sign of DVT DTR's 2+ Presentation: cephalic Fetal monitoringBaseline: 150 bpm, Variability: Good {> 6 bpm), Accelerations: Reactive and Decelerations: Absent Uterine activity:  rare  Dilation: 1.5 Effacement (%): 50 Station: -2 Exam by:: F Cres-Dishmon, CNM   Prenatal labs: ABO, Rh: --/--/O POS (05/26 2115) Antibody: NEG (05/26  2115) Rubella:  immune RPR: NON REAC (03/28 1107)  HBsAg: NEGATIVE (01/30 1142)  HIV: NON REACTIVE (03/28 1107)  GBS:   collected 1 hr Glucola 199 Genetic screening:  AFP normal Anatomy US normal; EFW 78% (5# on 5/15)    Assessment: Shena Vinluan is a 27 y.o. [phone] with an IUP at [redacted]w[redacted]d presenting for PPROM; TOLAC, A2DM  Plan: Foley>Pitocin CBG monitoring   CRESENZO-DISHMAN,Dashanti Burr 07/06/2012, 11:37 PM  ad

## 2012-07-06 NOTE — MAU Note (Signed)
Pt states gushing lots of fluid for 30 minutes, hasn't looked to see color of fluid. Denies pain.

## 2012-07-06 NOTE — MAU Note (Signed)
Fern positive. 

## 2012-07-07 ENCOUNTER — Encounter (HOSPITAL_COMMUNITY): Payer: Self-pay | Admitting: *Deleted

## 2012-07-07 ENCOUNTER — Inpatient Hospital Stay (HOSPITAL_COMMUNITY): Payer: Medicaid Other | Admitting: Anesthesiology

## 2012-07-07 ENCOUNTER — Encounter (HOSPITAL_COMMUNITY): Payer: Self-pay | Admitting: Anesthesiology

## 2012-07-07 ENCOUNTER — Other Ambulatory Visit: Payer: Self-pay

## 2012-07-07 DIAGNOSIS — O4290 Premature rupture of membranes, unspecified as to length of time between rupture and onset of labor, unspecified weeks of gestation: Secondary | ICD-10-CM

## 2012-07-07 DIAGNOSIS — O26619 Liver and biliary tract disorders in pregnancy, unspecified trimester: Secondary | ICD-10-CM

## 2012-07-07 DIAGNOSIS — O2432 Unspecified pre-existing diabetes mellitus in childbirth: Secondary | ICD-10-CM

## 2012-07-07 LAB — GLUCOSE, CAPILLARY
Glucose-Capillary: 105 mg/dL — ABNORMAL HIGH (ref 70–99)
Glucose-Capillary: 96 mg/dL (ref 70–99)
Glucose-Capillary: 57 mg/dL — ABNORMAL LOW (ref 70–99)
Glucose-Capillary: 73 mg/dL (ref 70–99)

## 2012-07-07 MED ORDER — ONDANSETRON HCL 4 MG PO TABS: 4.0000 mg | ORAL_TABLET | ORAL | Status: DC | PRN

## 2012-07-07 MED ORDER — OXYCODONE-ACETAMINOPHEN 5-325 MG PO TABS: 1.0000 | ORAL_TABLET | ORAL | Status: DC | PRN

## 2012-07-07 MED ORDER — OXYTOCIN 40 UNITS IN LACTATED RINGERS INFUSION - SIMPLE MED: 1.0000 m[IU]/min | INTRAVENOUS | Status: DC

## 2012-07-07 MED ORDER — TETANUS-DIPHTH-ACELL PERTUSSIS 5-2.5-18.5 LF-MCG/0.5 IM SUSP: 0.5000 mL | Freq: Once | INTRAMUSCULAR | Status: DC

## 2012-07-07 MED ORDER — PRENATAL MULTIVITAMIN CH
1.0000 | ORAL_TABLET | Freq: Every day | ORAL | Status: DC
  Administered 2012-07-08: 1 via ORAL
  Filled 2012-07-07: qty 1

## 2012-07-07 MED ORDER — DIBUCAINE 1 % RE OINT: 1.0000 "application " | TOPICAL_OINTMENT | RECTAL | Status: DC | PRN

## 2012-07-07 MED ORDER — DIPHENHYDRAMINE HCL 25 MG PO CAPS: 25.0000 mg | ORAL_CAPSULE | Freq: Four times a day (QID) | ORAL | Status: DC | PRN

## 2012-07-07 MED ORDER — WITCH HAZEL-GLYCERIN EX PADS: 1.0000 "application " | MEDICATED_PAD | CUTANEOUS | Status: DC | PRN

## 2012-07-07 MED ORDER — LANOLIN HYDROUS EX OINT: TOPICAL_OINTMENT | CUTANEOUS | Status: DC | PRN

## 2012-07-07 MED ORDER — LIDOCAINE HCL (PF) 1 % IJ SOLN
INTRAMUSCULAR | Status: DC | PRN
  Administered 2012-07-07 (×4): 4 mL

## 2012-07-07 MED ORDER — SENNOSIDES-DOCUSATE SODIUM 8.6-50 MG PO TABS
2.0000 | ORAL_TABLET | Freq: Every day | ORAL | Status: DC
  Administered 2012-07-08: 2 via ORAL

## 2012-07-07 MED ORDER — SIMETHICONE 80 MG PO CHEW: 80.0000 mg | CHEWABLE_TABLET | ORAL | Status: DC | PRN

## 2012-07-07 MED ORDER — IBUPROFEN 600 MG PO TABS
600.0000 mg | ORAL_TABLET | Freq: Four times a day (QID) | ORAL | Status: DC
  Administered 2012-07-08 – 2012-07-09 (×6): 600 mg via ORAL
  Filled 2012-07-07 (×6): qty 1

## 2012-07-07 MED ORDER — ZOLPIDEM TARTRATE 5 MG PO TABS: 5.0000 mg | ORAL_TABLET | Freq: Every evening | ORAL | Status: DC | PRN

## 2012-07-07 MED ORDER — BENZOCAINE-MENTHOL 20-0.5 % EX AERO: 1.0000 "application " | INHALATION_SPRAY | CUTANEOUS | Status: DC | PRN

## 2012-07-07 MED ORDER — ONDANSETRON HCL 4 MG/2ML IJ SOLN: 4.0000 mg | INTRAMUSCULAR | Status: DC | PRN

## 2012-07-07 NOTE — Anesthesia Procedure Notes (Signed)
Epidural Patient location during procedure: OB Start time: 07/07/2012 6:18 PM  Staffing Performed by: anesthesiologist   Preanesthetic Checklist Completed: patient identified, site marked, surgical consent, pre-op evaluation, timeout performed, IV checked, risks and benefits discussed and monitors and equipment checked  Epidural Patient position: sitting Prep: site prepped and draped and DuraPrep Patient monitoring: continuous pulse ox and blood pressure Approach: midline Injection technique: LOR air  Needle:  Needle type: Tuohy  Needle gauge: 17 G Needle length: 9 cm and 9 Needle insertion depth: 6 cm Catheter type: closed end flexible Catheter size: 19 Gauge Catheter at skin depth: 11 cm Test dose: negative  Assessment Events: blood not aspirated, injection not painful, no injection resistance, negative IV test and no paresthesia  Additional Notes Discussed risk of headache, infection, bleeding, nerve injury and failed or incomplete block.  Patient voices understanding and wishes to proceed. Epidural placed easily on first attempt.  No paresthesia.  Patient tolerated procedure well with no apparent complications.  Jasmine December, MDReason for block:procedure for pain

## 2012-07-07 NOTE — Anesthesia Preprocedure Evaluation (Signed)
Anesthesia Evaluation  Patient identified by MRN, date of birth, ID band Patient awake    Reviewed: Allergy & Precautions, H&P , NPO status , Patient's Chart, lab work & pertinent test results, reviewed documented beta blocker date and time   History of Anesthesia Complications Negative for: history of anesthetic complications  Airway Mallampati: I TM Distance: >3 FB Neck ROM: full    Dental  (+) Teeth Intact   Pulmonary neg pulmonary ROS,  breath sounds clear to auscultation        Cardiovascular negative cardio ROS  Rhythm:regular Rate:Normal     Neuro/Psych negative neurological ROS  negative psych ROS   GI/Hepatic negative GI ROS, Cholestasis   Endo/Other  diabetes, Gestational, Oral Hypoglycemic AgentsBMI 35  Renal/GU Renal disease (h/o renal stones)     Musculoskeletal   Abdominal   Peds  Hematology  (+) anemia ,   Anesthesia Other Findings   Reproductive/Obstetrics (+) Pregnancy (h/o c/s x1, h/o VBAC x1)                           Anesthesia Physical Anesthesia Plan  ASA: III  Anesthesia Plan: Epidural   Post-op Pain Management:    Induction:   Airway Management Planned:   Additional Equipment:   Intra-op Plan:   Post-operative Plan:   Informed Consent: I have reviewed the patients History and Physical, chart, labs and discussed the procedure including the risks, benefits and alternatives for the proposed anesthesia with the patient or authorized representative who has indicated his/her understanding and acceptance.     Plan Discussed with:   Anesthesia Plan Comments:         Anesthesia Quick Evaluation

## 2012-07-07 NOTE — Progress Notes (Signed)
Delsie Amador is a 27 y.o. [phone] at [redacted]w[redacted]d by ultrasound admitted for PPROM, TOLAC.  Subjective: Doing well; Foley bulb has fallen out.  Objective: BP 110/57  Pulse 79  Temp(Src) 98.6 F (37 C) (Oral)  Resp 20  Ht 5' (1.524 m)  Wt 81.285 kg (179 lb 3.2 oz)  BMI 35 kg/m2      FHT:  FHR: 150 bpm, variability: moderate,  accelerations:  Present,  decelerations:  Absent UC:   regular, every 2-5 minutes SVE:   Dilation: 1.5 Effacement (%): 50 Station: -2 Exam by:: F Cres-Dishmon, CNM  Labs: Lab Results  Component Value Date   WBC 10.7* 07/06/2012   HGB 10.3* 07/06/2012   HCT 30.5* 07/06/2012   MCV 83.6 07/06/2012   PLT 264 07/06/2012    Assessment / Plan: Induction of labor due to PPROM.  Foley bulb now out. Doing well on Pitocin  Labor: Progressing on Pitocin Fetal Wellbeing:  Category I Pain Control:  Labor support without medications Anticipated MOD:  NSVD  Everlene Other 07/07/2012, 12:44 AM

## 2012-07-07 NOTE — Progress Notes (Signed)
   Vanessa Henson is a 27 y.o. [phone] at [redacted]w[redacted]d  admitted for PPROM  Subjective:  Contractions are getting stronger Objective: BP 84/50  Pulse 77  Temp(Src) 98.4 F (36.9 C) (Oral)  Resp 18  Ht 5' (1.524 m)  Wt 81.285 kg (179 lb 3.2 oz)  BMI 35 kg/m2    FHT:  FHR: 130 bpm, variability: moderate,  accelerations:  Present,  decelerations:  Absent UC:   irregular, every 2-5 minutes SVE:   Dilation: 4.5 Effacement (%): 70 Station: -3 Exam by:: L. Cresenzo/C Whicker, RN Pitocin @ 10 mu/min  Labs: Lab Results  Component Value Date   WBC 10.7* 07/06/2012   HGB 10.3* 07/06/2012   HCT 30.5* 07/06/2012   MCV 83.6 07/06/2012   PLT 264 07/06/2012    Assessment / Plan: IOL fo rPROM, attempting to get into labor  Labor: not yet Fetal Wellbeing:  Category I Pain Control:  Labor support without medications Anticipated MOD:  NSVD  CRESENZO-DISHMAN,Monasia Lair 07/07/2012, 6:38 AM

## 2012-07-08 ENCOUNTER — Encounter (HOSPITAL_COMMUNITY): Payer: Self-pay

## 2012-07-08 LAB — CBC
Hemoglobin: 9.7 g/dL — ABNORMAL LOW (ref 12.0–15.0)
MCH: 27.7 pg (ref 26.0–34.0)
MCV: 83.7 fL (ref 78.0–100.0)
RBC: 3.5 MIL/uL — ABNORMAL LOW (ref 3.87–5.11)

## 2012-07-08 NOTE — Progress Notes (Signed)
Post Partum Day #1 Subjective: no complaints and tolerating PO; wants to pump for NICU infant; contraception not reviewed  Objective: Blood pressure 99/63, pulse 64, temperature 98.2 F (36.8 C), temperature source Oral, resp. rate 16, height 5' (1.524 m), weight 81.285 kg (179 lb 3.2 oz), SpO2 100.00%, unknown if currently breastfeeding.  Physical Exam:  General: alert, cooperative and mild distress Lochia: appropriate Uterine Fundus: firm DVT Evaluation: No evidence of DVT seen on physical exam.   Recent Labs  07/06/12 2115 07/08/12 0553  HGB 10.3* 9.7*  HCT 30.5* 29.3*    Assessment/Plan: Plan for discharge tomorrow   LOS: 2 days   Cam Hai 07/08/2012, 7:58 AM

## 2012-07-08 NOTE — Anesthesia Postprocedure Evaluation (Signed)
Anesthesia Post Note  Patient: Vanessa Henson  Procedure(s) Performed: * No procedures listed *  Anesthesia type: Epidural  Patient location: Mother/Baby  Post pain: Pain level controlled  Post assessment: Post-op Vital signs reviewed  Last Vitals:  Filed Vitals:   07/08/12 0833  BP: 96/60  Pulse: 69  Temp: 36.7 C  Resp: 18    Post vital signs: Reviewed  Level of consciousness:alert  Complications: No apparent anesthesia complications

## 2012-07-09 LAB — TYPE AND SCREEN
ABO/RH(D): O POS
Antibody Screen: NEGATIVE
Unit division: 0
Unit division: 0

## 2012-07-09 MED ORDER — OXYCODONE-ACETAMINOPHEN 5-325 MG PO TABS: 1.0000 | ORAL_TABLET | ORAL | Status: DC | PRN

## 2012-07-09 MED ORDER — IBUPROFEN 600 MG PO TABS: 600.0000 mg | ORAL_TABLET | Freq: Four times a day (QID) | ORAL | Status: DC

## 2012-07-09 NOTE — Progress Notes (Signed)
I assisted Rn with Interpretation of discharge orders.

## 2012-07-09 NOTE — Progress Notes (Signed)
Pt discharge eduction done with interpretor , lactation was called for teaching, register called by father of baby . Patient and spouse will be visiting baby before they leave. Called pharmacy for pts home meds. Pt ambulated out of unit

## 2012-07-12 NOTE — Discharge Summary (Signed)
Physician Discharge Summary  Patient ID: Vanessa Henson MRN: [id number] DOB/AGE: [birth year] 26 y.o.  Admit date: 07/06/2012 Discharge date: 07/12/2012  Admission Diagnoses: PPROM at 34 weeks, Class A2 diabetes Discharge Diagnoses:  same  Discharged Condition: stable  Hospital Course: see HPI and progess delivery summary Had PPROM at [redacted] weeks gestation, cervix ripened and labor induced.  Delivery uncomplicated, infant to NICU, doing well at the time discharge  Consults: None and NICU  Significant Diagnostic Studies: sonogram  Treatments: delivery  Discharge Exam: Blood pressure 96/53, pulse 67, temperature 97.7 F (36.5 C), temperature source Oral, resp. rate 16, height 5' (1.524 m), weight 179 lb 3.2 oz (81.285 kg), SpO2 97.00%, unknown if currently breastfeeding. General appearance: alert, cooperative and no distress GI: soft, non-tender; bowel sounds normal; no masses,  no organomegaly Extremities: Homans sign is negative, no sign of DVT  Disposition: 01-Home or Self Care  Discharge Orders   Future Appointments Provider Department Dept Phone   08/10/2012 3:15 PM Catalina Antigua, MD [facility] [phone]   Future Orders Complete By Expires     Call MD for:  persistant nausea and vomiting  As directed     Call MD for:  severe uncontrolled pain  As directed     Call MD for:  temperature >100.4  As directed     Call MD for:  As directed     Comments:      Excessive bleeding    Diet - low sodium heart healthy  As directed     Sexual acrtivity  As directed     Comments:      No sex for 6 weeks        Medication List    STOP taking these medications       glyBURIDE 2.5 MG tablet  Commonly known as:  DIABETA      TAKE these medications       ibuprofen 600 MG tablet  Commonly known as:  ADVIL,MOTRIN  Take 1 tablet (600 mg total) by mouth every 6 (six) hours.     oxyCODONE-acetaminophen 5-325 MG per tablet  Commonly known as:  PERCOCET/ROXICET   Take 1-2 tablets by mouth every 4 (four) hours as needed.     prenatal multivitamin Tabs  Take 1 tablet by mouth daily at 12 noon.     ursodiol 500 MG tablet  Commonly known as:  ACTIGALL  Take 1 tablet (500 mg total) by mouth 2 (two) times daily.           Follow-up Information   Follow up with [facility] . Schedule an appointment as soon as possible for a visit in 6 weeks. (post partum)    Contact information:   [address] [place] [address] [phone]      Signed: Lazaro Arms 07/12/2012, 10:09 PM

## 2012-07-13 ENCOUNTER — Other Ambulatory Visit: Payer: Self-pay

## 2012-08-10 ENCOUNTER — Ambulatory Visit (INDEPENDENT_AMBULATORY_CARE_PROVIDER_SITE_OTHER): Payer: Medicaid Other | Admitting: Obstetrics & Gynecology

## 2012-08-10 ENCOUNTER — Encounter: Payer: Self-pay | Admitting: Obstetrics & Gynecology

## 2012-08-10 VITALS — BP 90/60 | HR 80 | Temp 98.2°F | Ht 60.0 in | Wt 162.8 lb

## 2012-08-10 DIAGNOSIS — O228X9 Other venous complications in pregnancy, unspecified trimester: Secondary | ICD-10-CM

## 2012-08-10 DIAGNOSIS — K649 Unspecified hemorrhoids: Secondary | ICD-10-CM

## 2012-08-10 DIAGNOSIS — O99345 Other mental disorders complicating the puerperium: Secondary | ICD-10-CM

## 2012-08-10 DIAGNOSIS — F329 Major depressive disorder, single episode, unspecified: Secondary | ICD-10-CM

## 2012-08-10 DIAGNOSIS — O2240 Hemorrhoids in pregnancy, unspecified trimester: Secondary | ICD-10-CM

## 2012-08-10 DIAGNOSIS — F53 Postpartum depression: Secondary | ICD-10-CM

## 2012-08-10 DIAGNOSIS — Z8632 Personal history of gestational diabetes: Secondary | ICD-10-CM

## 2012-08-10 MED ORDER — HYDROCORTISONE ACETATE 25 MG RE SUPP: 25.0000 mg | Freq: Two times a day (BID) | RECTAL | Status: DC

## 2012-08-10 NOTE — Patient Instructions (Signed)
Can use Preparation H for hemorrHemorroides  (Hemorrhoids) Las hemorroides son venas inflamadas alrededor del recto o del ano. Hay dos tipos de hemorroides:   Hemorroides internas. Aparecen en las venas del interior del recto. Pueden abultarse hacia el exterior e irritarse y Cabin crew.  Hemorroides externas. Se producen en las venas externas al ano y pueden sentirse como un bulto o zona hinchada dura y dolorosa cerca del ano. CAUSAS   Embarazo.   Obesidad.   Constipacin o diarrea.   Dificultad para mover el intestino.   Permanecer sentado durante largos perodos en el inodoro.  Levantar pesas u otras actividades que impliquen esfuerzo.  Sexo anal. SNTOMAS   Dolor.   Picazn o irritacin anal.   Sangrado rectal.   Prdida fecal.   Hinchazn anal.   Uno o ms bultos en la zona anal.  DIAGNSTICO  El mdico puede diagnosticar las hemorroides mediante un examen visual. Otros estudios o anlisis que se pueden realizar son:   Examen de la zona rectal con Neomia Dear mano enguantada (examen digital rectal).   Examen de canal anal usando un pequeo tubo (endoscopio).   Anlisis de sangre si ha perdido [place] cantidad significativa de [place].  Estudio para observar el interior del colon (sigmoidoscopa o colonoscopa). TRATAMIENTO  La mayora de las hemorroides pueden tratarse en casa. Sin embargo, si los sntomas no mejoran o tiene Runner, broadcasting/film/video, el mdico puede Education officer, environmental un procedimiento para disminuir las hemorroides o extirparlas completamente. Los tratamientos posibles son:   Colocacin de una banda de goma en la base de la hemorroide para cortar la circulacin (ligadura con Curator).   Inyeccin de una sustancia qumica para reducir la hemorroide (escleroterapia).   Utilizacin de un instrumento para quemar la hemorroide (terapia con luz infrarroja).   Extirpacin quirrgica de las hemorroides (hemorroidectoma).   Colocacin de grapas en la hemorroide  para bloquear el flujo de sangre a los tejidos (engrapado de hemorroides).  INSTRUCCIONES PARA EL CUIDADO EN EL HOGAR   Consuma alimentos con fibra, como cereales integrales, legumbres, frutos secos, frutas y verduras. Pregntele a su mdico acerca de tomar productos con fibra aadida en ellos (suplementos defibra).  Aumente la ingesta de lquidos. Beba gran cantidad de lquido para mantener la orina de tono claro o color amarillo plido.   Haga ejercicios regularmente.   Vaya al bao cuando sienta la necesidad de mover el intestino. No espere.   Evite hacer fuerza al mover el intestino.   Mantenga la zona anal limpia y seca. Use papel higinico hmedo o toallitas humedecidas despus de mover el intestino.   Puede usar o Contractor segn las indicaciones algunas cremas especiales y supositorios.   Tome slo medicamentos de venta libre o recetados, segn las indicaciones del mdico.   Tome baos de asiento tibios durante 15 a 20 minutos, 3 a 4 veces por da para Primary school teacher y las [place].   Coloque una bolsa de hielo sobre las hemorroides si le duelen o se hinchan. Usar las compresas de [employer] baos de asiento puede ser [place].   Ponga el hielo en una bolsa plstica.   Colquese una toalla entre la piel y la bolsa de hielo.   Deje el hielo durante 15 a 20 minutos y aplquelo 3 a 4 veces por da.   No utilice una almohada en forma de aro ni se siente en el inodoro durante perodos prolongados. Esto aumenta la afluencia de sangre y Chief Technology Officer.  SOLICITE ATENCIN MDICA SI:   Lenora Boys  el dolor y la hinchazn y no puede controlarlo con la medicacin o con Pharmacist, community.  Tiene un sangrado que no puede parar.  No puede mover el intestino.  Siente dolor o tiene inflamacin fuera de la zona de las hemorroides. ASEGRESE DE QUE:   Comprende estas instrucciones.  Controlar su enfermedad.  Solicitar ayuda de inmediato si no mejora o si empeora. Document  Released: 01/28/2005 Document Revised: 01/15/2012 [facility] Patient Information 2014 [place], [place]. hoids

## 2012-08-10 NOTE — Progress Notes (Signed)
  Subjective:     Vanessa Henson is a 27 y.o. [phone] female who presents for a postpartum visit. Patient is Spanish-speaking only, Spanish interpreter present for this encounter.  She is 4 weeks postpartum following a VBAC at 34 weeks after PPROM. I have fully reviewed the prenatal and intrapartum course; patient had A2GDM and cholestasis.  Postpartum course has been uncomplicated. Baby's course has been uncomplicated, was in NICU for a few weeks. Baby is feeding by both breast and bottle. Bleeding no bleeding. Bowel function is normal but reports some occasions of spotting due to hemorrhoids. Bladder function is normal. Patient is not sexually active. Contraception method is none; she is undecided. Postpartum depression screening: negative.  The following portions of the patient's history were reviewed and updated as appropriate: allergies, current medications, past family history, past medical history, past social history, past surgical history and problem list.  Review of Systems Pertinent items are noted in HPI.   Objective:    BP 90/60  Pulse 80  Temp(Src) 98.2 F (36.8 C)  Ht 5' (1.524 m)  Wt 162 lb 12.8 oz (73.846 kg)  BMI 31.79 kg/m2  Breastfeeding? No  General:  alert and no distress   Breasts:  inspection negative, no nipple discharge or bleeding, no masses or nodularity palpable  Lungs: clear to auscultation bilaterally  Heart:  regular rate and rhythm  Abdomen: soft, non-tender; bowel sounds normal; no masses,  no organomegaly   Vulva:  normal  Vagina: normal vagina, no discharge, exudate, lesion, or erythema  Cervix:  normal  Corpus: normal size, contour, position, consistency, mobility, non-tender  Adnexa:  normal adnexa and no mass, fullness, tenderness  Rectal Exam: external hemorrhoids noted        Assessment:   Normal postpartum exam. Pap smear not done at today's visit.  Hemorrhoids  Plan:    1. Contraception: none, patient to call if she needs  prescribed contraceptives 2.  Preparation H recommended, also prescribed Anusol-HC.  Return for worsening symptoms. 3. Follow up in 2-3 weeks for 2 hour GTT given history of A2GDM

## 2012-08-24 ENCOUNTER — Other Ambulatory Visit: Payer: Medicaid Other

## 2012-08-24 DIAGNOSIS — O99814 Abnormal glucose complicating childbirth: Secondary | ICD-10-CM

## 2012-08-25 ENCOUNTER — Other Ambulatory Visit: Payer: Medicaid Other

## 2012-09-04 ENCOUNTER — Telehealth: Payer: Self-pay | Admitting: General Practice

## 2012-09-04 NOTE — Telephone Encounter (Signed)
Called patient with Marchelle Folks for interpreter and informed patient of results and recommendations. Patient verbalized understanding and had no further questions

## 2012-09-04 NOTE — Telephone Encounter (Signed)
Message copied by Kathee Delton on Fri Sep 04, 2012  9:38 AM ------      Message from: Jaynie Collins A      Created: Thu Aug 27, 2012  1:38 PM       Normal 2 hr GTT. Needs to follow up with PCP. Please call to inform patient of results and recommendations.       ------

## 2013-08-15 IMAGING — US US OB FOLLOW-UP
1 series · 12 of 28 positions shown · non-contrast
Comparison: none

[Series 1: us ob follow up · 12 of 36 slices shown]
[im 2/36]
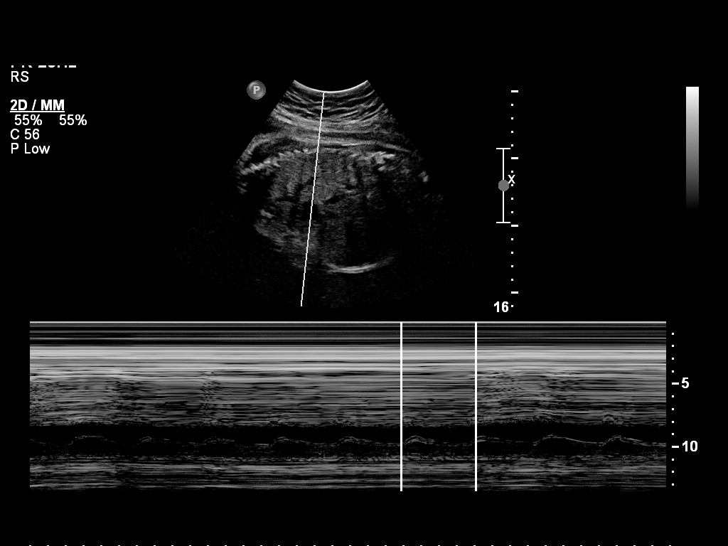
[im 4/36]
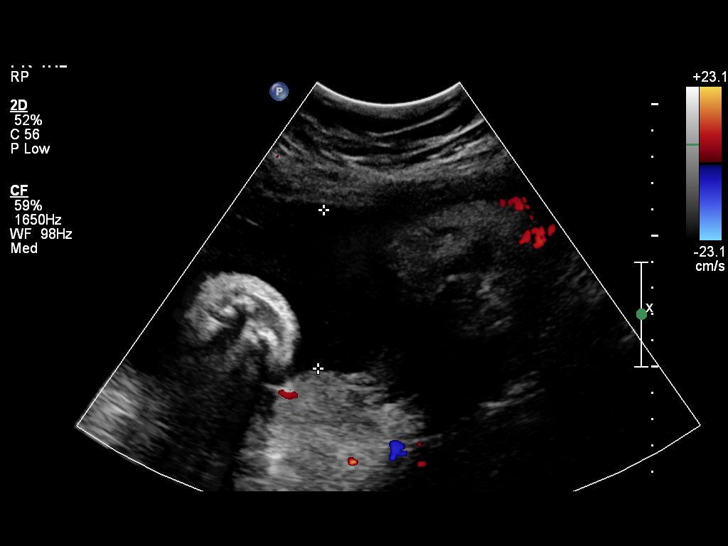
[im 7/36]
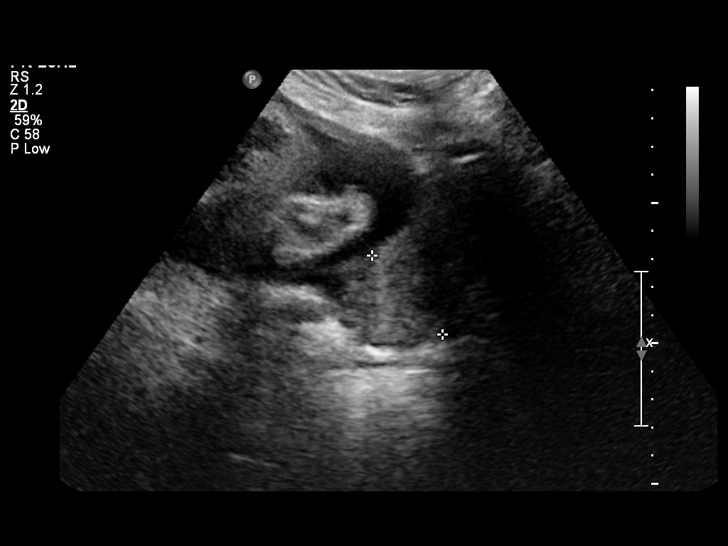
[im 11/36]
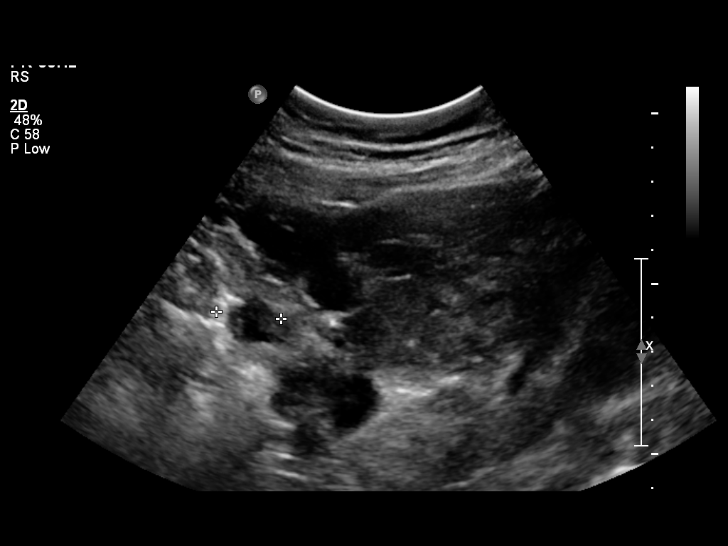
[im 13/36]
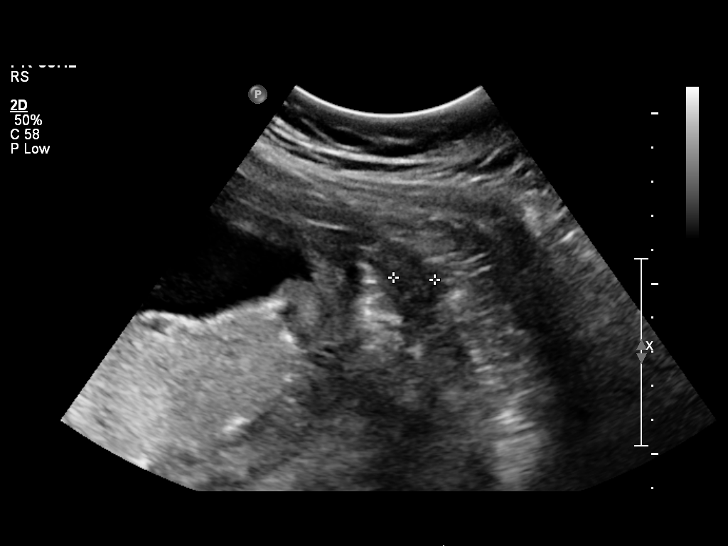
[im 16/36]
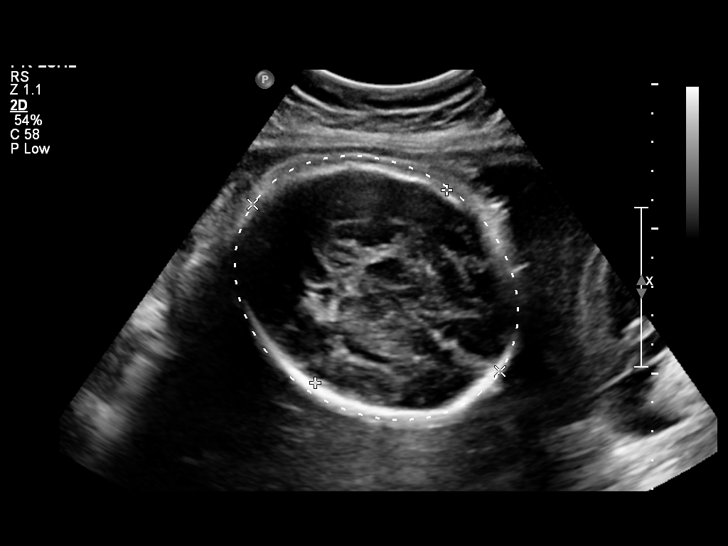
[im 20/36]
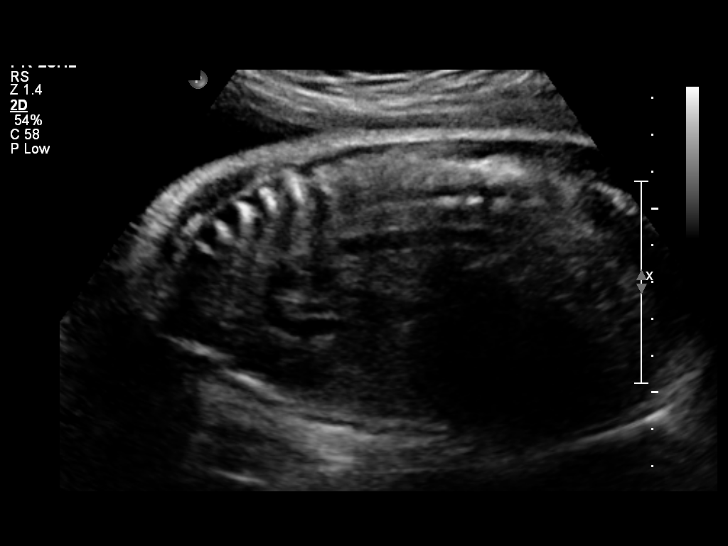
[im 23/36]
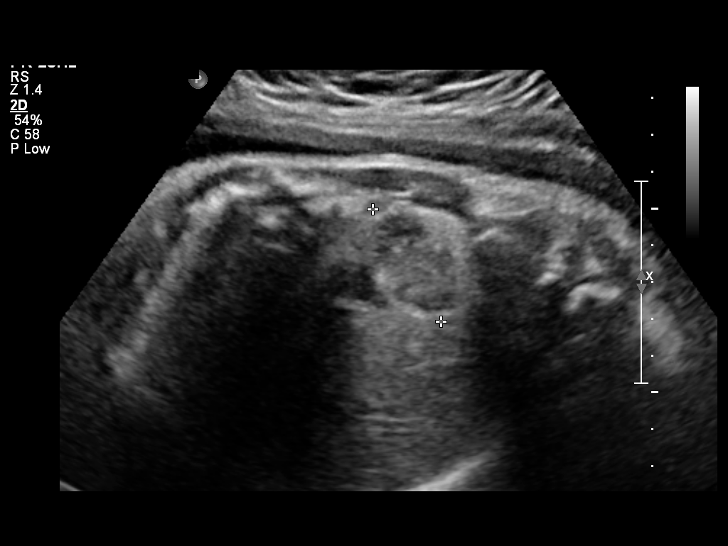
[im 25/36]
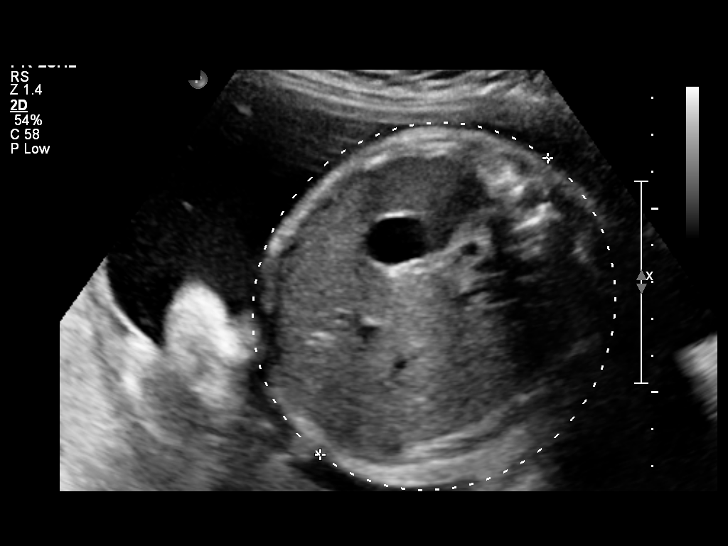
[im 29/36]
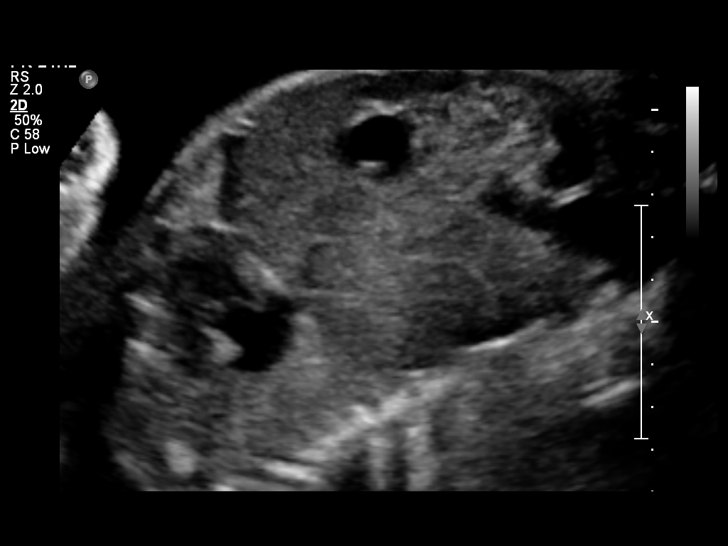
[im 32/36]
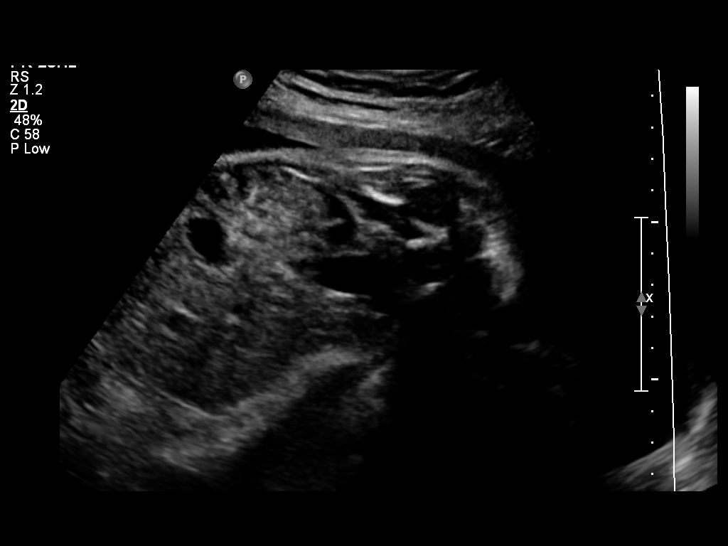
[im 34/36]
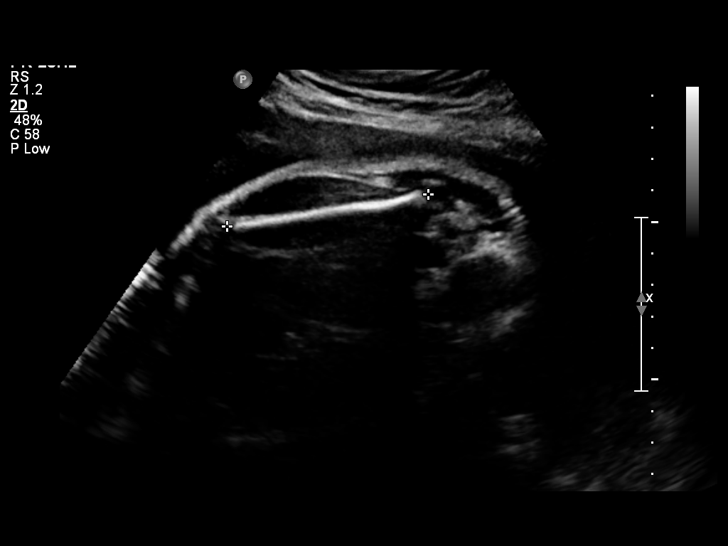

[12 of 28 positions shown; findings below may reference images not displayed]

OBSTETRICS REPORT
                      (Signed Final 06/25/2012 [DATE])

             MARYANTI

Service(s) Provided

 US OB FOLLOW UP                                       76816.1
Indications

 Diabetes - Gestational, A1 (diet controlled)
Fetal Evaluation

 Num Of Fetuses:    1
 Fetal Heart Rate:  143                         bpm
 Cardiac Activity:  Observed
 Presentation:      Breech
 Placenta:          Posterior, above cervical
                    os
 P. Cord            Previously Visualized
 Insertion:

 Amniotic Fluid
 AFI FV:      Subjectively within normal limits
 AFI Sum:     26.26   cm      96   %Tile     Larg Pckt:   7.56   cm
 RUQ:   5.74   cm    RLQ:    6.02   cm    LUQ:   6.94    cm   LLQ:    7.56   cm
Biometry

 BPD:     80.2  mm    G. Age:   32w 1d                CI:        74.64   70 - 86
                                                      FL/HC:      22.1   19.9 -

 HC:     294.6  mm    G. Age:   32w 4d       15  %    HC/AC:      0.97   0.96 -

 AC:     305.2  mm    G. Age:   34w 4d       92  %    FL/BPD:     81.2   71 - 87
 FL:      65.1  mm    G. Age:   33w 4d       65  %    FL/AC:      21.3   20 - 24

 Est. FW:    9999  gm          5 lb      78  %
Gestational Age

 LMP:           33w 1d       Date:   11/06/11                 EDD:   08/12/12
 U/S Today:     33w 1d                                        EDD:   08/12/12
 Best:          32w 4d    Det. By:   U/S (03/05/12)           EDD:   08/16/12
Anatomy

 Cranium:          Appears normal         Aortic Arch:      Appears normal
 Fetal Cavum:      Previously seen        Ductal Arch:      Not well visualized
 Ventricles:       Appears normal         Diaphragm:        Appears normal
 Choroid Plexus:   Previously seen        Stomach:          Appears normal
 Cerebellum:       Previously seen        Abdomen:          Appears normal
 Posterior Fossa:  Previously seen        Abdominal Wall:   Previously seen
 Nuchal Fold:      Previously seen        Cord Vessels:     Previously seen
 Face:             Orbits and profile     Kidneys:          Appear normal
                   previously seen
 Lips:             Previously seen        Bladder:          Appears normal
 Heart:            Previously seen        Spine:            Previously seen
 RVOT:             Not well visualized    Lower             Previously seen
                                          Extremities:
 LVOT:             Previously seen        Upper             Previously seen
                                          Extremities:

 Other:  Nasal bone prev. visualized. Heels and 5th digit prev. visualized.
         Female gender. Technically difficult due to fetal position.
Targeted Anatomy

 Fetal Central Nervous System
 Lat. Ventricles:
Cervix Uterus Adnexa

 Cervical Length:   3.75      cm

 Cervix:       Normal appearance by transabdominal scan.
 Left Ovary:   Within normal limits.
 Right Ovary:  Within normal limits.

 Adnexa:     No abnormality visualized.
Impression

 Single intrauterine gestation demonstrating an estimated
 gestational age by ultrasound of 33w 1d. This is correlated
 with expected estimated gestational age by prior ultrasound
 of 32w 4d. EFW is currently at the 78% with the abdominal
 circumference larger than the remaining gestational
 indicators at the 92%.

 No late developing fetal anatomic abnormalities are noted
 associated with the lateral ventricles, stomach, kidneys or
 bladder. The four chamber heart could not be well assessed
 due to fetal positioning.

 No focal placental abnormality seen.

 Subjectively normal and quantitatively increased amniotic
 fluid volume with an AFI at the 96%. The AFI is felt to be
 spuriously increased due to lateral positioning of fluid pockets.

 Normal cervical length and appearance.

## 2013-12-13 ENCOUNTER — Encounter: Payer: Self-pay | Admitting: Obstetrics & Gynecology

## 2014-12-09 LAB — GLUCOSE, POCT (MANUAL RESULT ENTRY): POC GLUCOSE: 85 mg/dL (ref 70–99)

## 2015-02-12 DIAGNOSIS — R7303 Prediabetes: Secondary | ICD-10-CM

## 2015-02-12 HISTORY — DX: Prediabetes: R73.03

## 2015-04-23 ENCOUNTER — Emergency Department (HOSPITAL_COMMUNITY)
Admission: EM | Admit: 2015-04-23 | Discharge: 2015-04-23 | Disposition: A | Discharge status: Home / Self Care | Payer: Self-pay | Attending: Emergency Medicine | Admitting: Emergency Medicine

## 2015-04-23 ENCOUNTER — Encounter (HOSPITAL_COMMUNITY): Payer: Self-pay | Admitting: Emergency Medicine

## 2015-04-23 DIAGNOSIS — R509 Fever, unspecified: Secondary | ICD-10-CM | POA: Insufficient documentation

## 2015-04-23 DIAGNOSIS — J029 Acute pharyngitis, unspecified: Secondary | ICD-10-CM | POA: Insufficient documentation

## 2015-04-23 DIAGNOSIS — H938X3 Other specified disorders of ear, bilateral: Secondary | ICD-10-CM | POA: Insufficient documentation

## 2015-04-23 DIAGNOSIS — R05 Cough: Secondary | ICD-10-CM | POA: Insufficient documentation

## 2015-04-23 LAB — RAPID STREP SCREEN (MED CTR MEBANE ONLY): Streptococcus, Group A Screen (Direct): NEGATIVE

## 2015-04-23 NOTE — ED Notes (Signed)
Pt. reports sore throat with dry cough , bilateral ear discomfort and low grade fever onset this week .

## 2015-04-25 LAB — CULTURE, GROUP A STREP (THRC)

## 2015-05-13 ENCOUNTER — Ambulatory Visit (INDEPENDENT_AMBULATORY_CARE_PROVIDER_SITE_OTHER): Payer: Self-pay

## 2015-05-13 ENCOUNTER — Ambulatory Visit (HOSPITAL_BASED_OUTPATIENT_CLINIC_OR_DEPARTMENT_OTHER)
Admission: RE | Admit: 2015-05-13 | Discharge: 2015-05-13 | Disposition: A | Discharge status: Home / Self Care | Payer: Self-pay | Source: Ambulatory Visit | Attending: Family Medicine | Admitting: Family Medicine

## 2015-05-13 ENCOUNTER — Ambulatory Visit (INDEPENDENT_AMBULATORY_CARE_PROVIDER_SITE_OTHER): Payer: Self-pay | Admitting: Family Medicine

## 2015-05-13 ENCOUNTER — Encounter (HOSPITAL_BASED_OUTPATIENT_CLINIC_OR_DEPARTMENT_OTHER): Payer: Self-pay

## 2015-05-13 VITALS — BP 96/70 | HR 86 | Temp 97.9°F | Resp 16 | Ht 62.0 in | Wt 178.2 lb

## 2015-05-13 DIAGNOSIS — H66003 Acute suppurative otitis media without spontaneous rupture of ear drum, bilateral: Secondary | ICD-10-CM

## 2015-05-13 DIAGNOSIS — J069 Acute upper respiratory infection, unspecified: Secondary | ICD-10-CM

## 2015-05-13 DIAGNOSIS — R112 Nausea with vomiting, unspecified: Secondary | ICD-10-CM

## 2015-05-13 DIAGNOSIS — R1030 Lower abdominal pain, unspecified: Secondary | ICD-10-CM

## 2015-05-13 DIAGNOSIS — R100 Acute abdomen: Secondary | ICD-10-CM

## 2015-05-13 LAB — POCT CBC
GRANULOCYTE PERCENT: 77.8 % (ref 37–80)
HEMATOCRIT: 35.2 % — AB (ref 37.7–47.9)
Hemoglobin: 13.1 g/dL (ref 12.2–16.2)
Lymph, poc: 2.5 (ref 0.6–3.4)
MCH, POC: 31.6 pg — AB (ref 27–31.2)
MCHC: 37.1 g/dL — AB (ref 31.8–35.4)
MCV: 85.1 fL (ref 80–97)
MID (CBC): 0.9 (ref 0–0.9)
MPV: 8.7 fL (ref 0–99.8)
POC GRANULOCYTE: 11.8 — AB (ref 2–6.9)
POC LYMPH %: 16.5 % (ref 10–50)
POC MID %: 5.7 % (ref 0–12)
Platelet Count, POC: 184 10*3/uL (ref 142–424)
RBC: 4.14 M/uL (ref 4.04–5.48)
RDW, POC: 12.9 %
WBC: 15.2 10*3/uL — AB (ref 4.6–10.2)

## 2015-05-13 LAB — POC MICROSCOPIC URINALYSIS (UMFC): MUCUS RE: ABSENT

## 2015-05-13 LAB — POCT URINALYSIS DIP (MANUAL ENTRY)
BILIRUBIN UA: NEGATIVE
Glucose, UA: NEGATIVE
NITRITE UA: NEGATIVE
PH UA: 6
SPEC GRAV UA: 1.025
UROBILINOGEN UA: 1

## 2015-05-13 LAB — POCT URINE PREGNANCY: Preg Test, Ur: NEGATIVE

## 2015-05-13 MED ORDER — IOPAMIDOL (ISOVUE-300) INJECTION 61%
100.0000 mL | Freq: Once | INTRAVENOUS | Status: AC | PRN
  Administered 2015-05-13: 100 mL via INTRAVENOUS

## 2015-05-13 MED ORDER — AMOXICILLIN 875 MG PO TABS: 875.0000 mg | ORAL_TABLET | Freq: Two times a day (BID) | ORAL | Status: DC

## 2015-05-13 NOTE — Patient Instructions (Addendum)
We will send you for a stat CT scan of the abdomen to look for possible appendicitis. Do not eat or drink anything until they do the CT of  You're to be begun on amoxicillin 875 mg one twice daily for ear infection.  If we do not find appendicitis, you should begin on MiraLAX 1 dose once or twice daily to help empty your bowels. Take it until the stool is loose, then discontinue and use only as needed.  Apendicitis (Appendicitis) Se llama apendicitis a la inflamacin del apndice. La inflamacin puede causar la ruptura (perforacin) y la acumulacin de pus. (absceso).  CAUSAS No siempre hay una causa evidente de apendicitis. A veces se produce por una obstruccin en el apndice. Las causas de la obstruccin pueden ser:  Elliot Gault pequea y dura de materia fecal del tamao de una arveja (fecalito).  Ganglios linfticos agrandados en el apndice. SINTOMAS  Dolor alrededor del ombligo que se irradia hacia la zona derecha del abdomen. El dolor puede ser cada vez ms intenso y agudo a medida que el tiempo pasa.  Sensibilidad en la zona inferior derecha del abdomen. El dolor empeora si tose o realiza algn movimiento brusco.  Ganas de vomitar (nuseas).  Vmitos.  Prdida del apetito.  Cristy Hilts.  Constipacin.  Diarrea.  Malestar general. DIAGNSTICO  Examen fsico.  Anlisis de sangre.  Anlisis de [place].  Una radiografa o tomografa computada para confirmar el diagnstico. TRATAMIENTO Una vez que el diagnstico de apendicitis se ha realizado, se procede a extirparlo lo antes posible. Este procedimiento se denomina apendicectoma. En una apendicectoma abierta se realiza un corte (incisin) en la zona inferior derecha del abdomen y se extirpa el apndice. En una apendicectoma laparoscpica, se realizan 3 incisiones pequeas. Para extirpar el apndice, se utiliza un instrumento largo y delgado y Carlota Raspberry. La mayora de los pacientes vuelve a su casa en 24 a 48 horas despus  de la [place].  En algunas situaciones, el apndice puede perforarse nuevamente y formarse un absceso. El absceso puede tener una "pared" alrededor y observarse en una tomografa computada. En este caso, le colocarn un drenaje en el absceso para que drene el lquido y podr recibir un tratamiento con antibiticos para [employer] grmenes. Estos medicamentos se administran a travs de un tubo en la vena (IV). Una vez que el absceso se ha eliminado, podr o no ser [employer] apendicectoma. Puede ser necesario que permanezca en el hospital durante ms de 48 horas.    Esta informacin no tiene Marine scientist el consejo del mdico. Asegrese de hacerle al mdico cualquier pregunta que tenga.   Document Released: 01/28/2005 Document Revised: 05/25/2012 Elsevier Interactive Patient Education 2016 [place] are to go over to [facility] for your CT Scan.  [place].  [phone].  Please enter through the main entrance and follow to radiology.    IF you received an x-ray today, you will receive an invoice from [facility]. Please contact [facility] at [phone] with questions or concerns regarding your invoice.   IF you received labwork today, you will receive an invoice from [employer]. Please contact [facility] at [phone] with questions or concerns regarding your invoice.   Our billing staff will not be able to assist you with questions regarding bills from these companies.  You will be contacted with the lab results as soon as they are available. The fastest way to get your results  is to activate your My Chart account. Instructions are located on the last page of this paperwork. If you have not heard from us regarding the results in 2 weeks, please contact this office.     

## 2015-05-13 NOTE — Progress Notes (Signed)
Patient ID: Vanessa Henson, female    DOB: Oct 29, 1985  Age: 30 y.o. MRN: [id number]  Chief Complaint  Patient presents with  . Otalgia    both ear mostly on the left x few days ago  . Fever    felt very hot did not take her temp  . Nausea  . Dizziness  . Back Pain    lower back x 2 days  . Abdominal Pain    x 2 days    Subjective:   Patient has been sick for a couple of days. She had upper respiratory congestion and left otalgia. She is not coughing. Does not have a sore throat. She had a high fever yesterday. Symptoms started day before yesterday. She took various OTC medications for fever and pain yesterday. She has had low abdominal pain since Thursday. No dysuria. It is a severe cramping pain like she was pregnant. She had a normal bowel movement last night. She had nausea and vomiting one time yesterday evening. She has not had any blood in her stool or urine. She has not had any abdominal surgeries with the exception of a cesarean section on her oldest child. She had her last menstrual cycle 2 months ago, but sometimes she goes 2 or 3 months. She denies that she is pregnant. She is not employed. 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  Current allergies, medications, problem list, past/family and social histories reviewed.  Objective:  BP 96/70 mmHg  Pulse 86  Temp(Src) 97.9 F (36.6 C) (Oral)  Resp 16  Ht 5\' 2"  (1.575 m)  Wt 178 lb 3.2 oz (80.831 kg)  BMI 32.58 kg/m2  SpO2 99%  LMP 02/21/2015  Pleasant lady, speaks very adequate [place]. Her right TM is dull with a bulging irregular tympanic membrane. Left TM is similar but it is red. Obvious loss of landmarks. Her throat is not erythematous. Nose has the sniffles. Neck supple without nodes. Chest clear to auscultation. Heart regular without murmur. She is not febrile right at this time. Abdomen has diminished bowel sounds. Soft. Generalized tenderness across the lower abdomen that seems more in the left lower abdomen and a little in the  right lower but more in the right upper quadrant. Skin is warm and dry with good skin turgor.  Assessment & Plan:   Assessment: 1. Lower abdominal pain   2. Non-intractable vomiting with nausea, unspecified vomiting type   3. Acute upper respiratory infection   4. Acute suppurative otitis media of both ears without spontaneous rupture of tympanic membranes, recurrence not specified   5. Acute abdomen       Plan: She has 2 things going on she does have ear infections which will need to be treated. She has an upper respiratory infection. She also has this abdominal pain which we need to check out. We'll do labs and urine and 2 view x-ray on that.  Orders Placed This Encounter  Procedures  . DG Abd 2 Views    Order Specific Question:  Reason for Exam (SYMPTOM  OR DIAGNOSIS REQUIRED)    Answer:  low abdominal pain and llq and ruq tenderness    Order Specific Question:  Is the patient pregnant?    Answer:  No     Comments:  lmp 2 months ago, denies pregnancy    Order Specific Question:  Preferred imaging location?    Answer:  External  . CT Abdomen Pelvis W Contrast    Standing Status: Future     Number of Occurrences:  Standing Expiration Date: 08/11/2016    Order Specific Question:  If indicated for the ordered procedure, I authorize the administration of contrast media per Radiology protocol    Answer:  Yes    Order Specific Question:  Reason for Exam (SYMPTOM  OR DIAGNOSIS REQUIRED)    Answer:  rlq abd pain r/o appendicitis, wbc 15000    Order Specific Question:  Is the patient pregnant?    Answer:  No     Comments:  hcg negative    Order Specific Question:  Preferred imaging location?    Answer:  External  . POCT CBC  . POCT urine pregnancy  . POCT Microscopic Urinalysis ([facility])  . POCT urinalysis dipstick    Meds ordered this encounter  Medications  . amoxicillin (AMOXIL) 875 MG tablet    Sig: Take 1 tablet (875 mg total) by mouth 2 (two) times daily.    Dispense:   20 tablet    Refill:  0   Xray--interpretation pending.  Nonspecific gas stool pattern.  Results for orders placed or performed in visit on 05/13/15  POCT CBC  Result Value Ref Range   WBC 15.2 (A) 4.6 - 10.2 K/uL   Lymph, poc 2.5 0.6 - 3.4   POC LYMPH PERCENT 16.5 10 - 50 %L   MID (cbc) 0.9 0 - 0.9   POC MID % 5.7 0 - 12 %M   POC Granulocyte 11.8 (A) 2 - 6.9   Granulocyte percent 77.8 37 - 80 %G   RBC 4.14 4.04 - 5.48 M/uL   Hemoglobin 13.1 12.2 - 16.2 g/dL   HCT, POC 35.2 (A) 37.7 - 47.9 %   MCV 85.1 80 - 97 fL   MCH, POC 31.6 (A) 27 - 31.2 pg   MCHC 37.1 (A) 31.8 - 35.4 g/dL   RDW, POC 12.9 %   Platelet Count, POC 184 142 - 424 K/uL   MPV 8.7 0 - 99.8 fL  POCT urine pregnancy  Result Value Ref Range   Preg Test, Ur Negative Negative  POCT Microscopic Urinalysis ([facility])  Result Value Ref Range   WBC,UR,HPF,POC Moderate (A) None WBC/hpf   RBC,UR,HPF,POC None None RBC/hpf   Bacteria Few (A) None, Too numerous to count   Mucus Absent Absent   Epithelial Cells, UR Per Microscopy Many (A) None, Too numerous to count cells/hpf  POCT urinalysis dipstick  Result Value Ref Range   Color, UA yellow yellow   Clarity, UA cloudy (A) clear   Glucose, UA negative negative   Bilirubin, UA negative negative   Ketones, POC UA small (15) (A) negative   Spec Grav, UA 1.025    Blood, UA trace-intact (A) negative   pH, UA 6.0    Protein Ur, POC =30 (A) negative   Urobilinogen, UA 1.0    Nitrite, UA Negative Negative   Leukocytes, UA small (1+) (A) Negative    Refer for CT to rule out appendicitis.   Patient Instructions   We will send you for a stat CT scan of the abdomen to look for possible appendicitis. Do not eat or drink anything until they do the CT of  You're to be begun on amoxicillin 875 mg one twice daily for ear infection.  If we do not find appendicitis, you should begin on MiraLAX 1 dose once or twice daily to help empty your bowels. Take it until the stool is  loose, then discontinue and use only as needed.  Apendicitis (Appendicitis) Se llama apendicitis a  la inflamacin del apndice. La inflamacin puede causar la ruptura (perforacin) y la acumulacin de pus. (absceso).  CAUSAS No siempre hay una causa evidente de apendicitis. A veces se produce por una obstruccin en el apndice. Las causas de la obstruccin pueden ser:  Elliot Gault pequea y dura de materia fecal del tamao de una arveja (fecalito).  Ganglios linfticos agrandados en el apndice. SINTOMAS  Dolor alrededor del ombligo que se irradia hacia la zona derecha del abdomen. El dolor puede ser cada vez ms intenso y agudo a medida que el tiempo pasa.  Sensibilidad en la zona inferior derecha del abdomen. El dolor empeora si tose o realiza algn movimiento brusco.  Ganas de vomitar (nuseas).  Vmitos.  Prdida del apetito.  Cristy Hilts.  Constipacin.  Diarrea.  Malestar general. DIAGNSTICO  Examen fsico.  Anlisis de sangre.  Anlisis de [place].  Una radiografa o tomografa computada para confirmar el diagnstico. TRATAMIENTO Una vez que el diagnstico de apendicitis se ha realizado, se procede a extirparlo lo antes posible. Este procedimiento se denomina apendicectoma. En una apendicectoma abierta se realiza un corte (incisin) en la zona inferior derecha del abdomen y se extirpa el apndice. En una apendicectoma laparoscpica, se realizan 3 incisiones pequeas. Para extirpar el apndice, se utiliza un instrumento largo y delgado y Carlota Raspberry. La mayora de los pacientes vuelve a su casa en 24 a 48 horas despus de la [place].  En algunas situaciones, el apndice puede perforarse nuevamente y formarse un absceso. El absceso puede tener una "pared" alrededor y observarse en una tomografa computada. En este caso, le colocarn un drenaje en el absceso para que drene el lquido y podr recibir un tratamiento con antibiticos para [employer] grmenes. Estos medicamentos  se administran a travs de un tubo en la vena (IV). Una vez que el absceso se ha eliminado, podr o no ser [employer] apendicectoma. Puede ser necesario que permanezca en el hospital durante ms de 48 horas.    Esta informacin no tiene Marine scientist el consejo del mdico. Asegrese de hacerle al mdico cualquier pregunta que tenga.   Document Released: 01/28/2005 Document Revised: 05/25/2012 Elsevier Interactive Patient Education 2016 [place] are to go over to [facility] for your CT Scan.  [place].  [phone].  Please enter through the main entrance and follow to radiology.    IF you received an x-ray today, you will receive an invoice from [facility] Radiology. Please contact [facility] Radiology at [phone] with questions or concerns regarding your invoice.   IF you received labwork today, you will receive an invoice from [employer]. Please contact [facility] at [phone] with questions or concerns regarding your invoice.   Our billing staff will not be able to assist you with questions regarding bills from these companies.  You will be contacted with the lab results as soon as they are available. The fastest way to get your results is to activate your My Chart account. Instructions are located on the last page of this paperwork. If you have not heard from [place] regarding the results in 2 weeks, please contact this office.          Return if symptoms worsen or fail to improve.   Lasonia Casino, MD 05/13/2015

## 2015-09-15 ENCOUNTER — Ambulatory Visit (INDEPENDENT_AMBULATORY_CARE_PROVIDER_SITE_OTHER): Payer: Self-pay | Admitting: Internal Medicine

## 2015-09-15 ENCOUNTER — Encounter: Payer: Self-pay | Admitting: Internal Medicine

## 2015-09-15 VITALS — BP 102/60 | HR 64 | Resp 16 | Ht 60.25 in | Wt 185.0 lb

## 2015-09-15 DIAGNOSIS — R102 Pelvic and perineal pain: Secondary | ICD-10-CM

## 2015-09-15 DIAGNOSIS — K029 Dental caries, unspecified: Secondary | ICD-10-CM

## 2015-09-15 DIAGNOSIS — N898 Other specified noninflammatory disorders of vagina: Secondary | ICD-10-CM

## 2015-09-15 DIAGNOSIS — N926 Irregular menstruation, unspecified: Secondary | ICD-10-CM

## 2015-09-15 LAB — POCT URINE PREGNANCY: PREG TEST UR: NEGATIVE

## 2015-09-15 LAB — POCT WET PREP WITH KOH
Clue Cells Wet Prep HPF POC: NEGATIVE
KOH PREP POC: NEGATIVE
RBC WET PREP PER HPF POC: NEGATIVE
TRICHOMONAS UA: NEGATIVE
Yeast Wet Prep HPF POC: NEGATIVE

## 2015-09-15 MED ORDER — LEVONORGESTREL-ETHINYL ESTRAD 0.1-20 MG-MCG PO TABS: 1.0000 | ORAL_TABLET | Freq: Every day | ORAL | 11 refills | Status: DC

## 2015-09-15 NOTE — Patient Instructions (Signed)
Habla clinica si tiene mas dolor [place] condon para un mes cuando [place] BCPs

## 2015-09-15 NOTE — Progress Notes (Signed)
   Subjective:    Patient ID: Vanessa Henson, female    DOB: 01/31/1986, 30 y.o.   MRN: [id number]  HPI   1.  Dental pain:  Broke tooth 2 years ago, right upper jaw.  For past week, having a bit more pain.  Would like dental referral  2.  Bilateral lower quadrant pain:  6-7 months.  Comes and goes.   Seems to be associated with cycle, in that the pain resolves after the first day of her period.  Pain is like "contractions"  She has before her period.  She can have the pain for an entire month or as short as a couple of days.   Had pain entire month of May and into the first half of June.  When she started her period June 17th, the pain resolved. Does believe her pain resolves every time she has a period.   Flanax or Naproxen helps the pain a lot. Her periods are very irregular, occurring every 2-4 months.   Menarche at a 30 yo.   Periods irregular since then  [id number] the latter at 16 weeks and was ectopic. Sounds like she wast treated with something like methotrexate and had ultrasound to follow up to be sure she passed the tissue.   C/S in 2007, low transverse. Denies change in urination or bowel habits.  No diarrhea, constipation, melena or hematochezia, no N/V.   Husband uses condoms  Was seen for similar complaint in April at [facility]:  CT of abdomen and pelvis at the time was normal  Meds:   Ibuprofen  600 mg twice daily as needed or Naproxen unknown dose twice daily as needed.  Allergies  Allergen Reactions  . Diphenhydramine Hcl Other (See Comments)    Dizziness and shortness of breath.        Review of Systems     Objective:   Physical Exam  NAD HEENT:  Canine with deep cavity and broken, left lower jaw, throat without injection. Neck:  Supple, no adenopathy Chest:  CTA CV: RRR with normal S1 and S2, No S3, S4, radial pulses normal and equal Abd:  S, + BS, No HSM or mass, tender in bilateral lower quadrants and suprapubic area. GU:  Normal  external genitalia,Mild mucousy white discharge from cervical os, No CMT.  Mild tenderness on palpation of uterus, no mass. Left adnexal pain in particular--mild with palpation of right and uterus.  No mass.   Urine HCG and Wet prep/KOH negative      Assessment & Plan:  1. Chronic pelvic pain with normal pelvic CT in April 2017:   Wet prep without abnormality.  Check GC and Chlamydia.  Unlikely that there is an infectious etiology However, concerned this may be endometriosis by history.   Will start Levonorgestrel-Ethinyl estradiol 0.1-20 mcg.  Consider continuous dosing if usual cyclic use not effective. Follow up in 3 months for CPE with pap

## 2015-09-18 ENCOUNTER — Encounter: Payer: Self-pay | Admitting: Internal Medicine

## 2015-10-03 ENCOUNTER — Ambulatory Visit: Payer: Self-pay | Admitting: Internal Medicine

## 2015-12-14 ENCOUNTER — Encounter: Payer: Self-pay | Admitting: Internal Medicine

## 2015-12-14 ENCOUNTER — Ambulatory Visit (INDEPENDENT_AMBULATORY_CARE_PROVIDER_SITE_OTHER): Payer: Self-pay | Admitting: Internal Medicine

## 2015-12-14 VITALS — BP 104/62 | HR 65 | Ht 60.0 in | Wt 183.0 lb

## 2015-12-14 DIAGNOSIS — Z3200 Encounter for pregnancy test, result unknown: Secondary | ICD-10-CM

## 2015-12-14 DIAGNOSIS — Z124 Encounter for screening for malignant neoplasm of cervix: Secondary | ICD-10-CM

## 2015-12-14 DIAGNOSIS — E119 Type 2 diabetes mellitus without complications: Secondary | ICD-10-CM

## 2015-12-14 DIAGNOSIS — R7303 Prediabetes: Secondary | ICD-10-CM

## 2015-12-14 DIAGNOSIS — Z8632 Personal history of gestational diabetes: Secondary | ICD-10-CM

## 2015-12-14 DIAGNOSIS — Z0000 Encounter for general adult medical examination without abnormal findings: Secondary | ICD-10-CM

## 2015-12-14 DIAGNOSIS — F419 Anxiety disorder, unspecified: Secondary | ICD-10-CM

## 2015-12-14 HISTORY — DX: Type 2 diabetes mellitus without complications: E11.9

## 2015-12-14 LAB — POCT URINE PREGNANCY: PREG TEST UR: NEGATIVE

## 2015-12-14 MED ORDER — PNV PRENATAL PLUS MULTIVITAMIN 27-1 MG PO TABS: ORAL_TABLET | ORAL | 11 refills | Status: DC

## 2015-12-14 MED ORDER — CALCIUM CITRATE-VITAMIN D 500-400 MG-UNIT PO CHEW: 1.0000 | CHEWABLE_TABLET | Freq: Two times a day (BID) | ORAL | Status: DC

## 2015-12-14 NOTE — Progress Notes (Signed)
Subjective:    Patient ID: Vanessa Henson, female    DOB: [birth year], 30 y.o.   MRN: [id number]  HPI   CPE with pap  1.  Pap:  Last 2014, always normal  2.  Mammogram:  Never  3.  Osteoprevention:  Not much in way of yogurt, milk or cheese on regular basis. Not outside in sun much.  4.  Guaiac Cards:  Never  5.  Colonoscopy:  Never  6.  Immunizations: Immunization History  Administered Date(s) Administered  . Influenza Split 12/09/2014  . Tdap 05/08/2012   No outpatient prescriptions have been marked as taking for the 12/14/15 encounter (Office Visit) with Mack Hook, MD.  Stopped BCPs in August--trying to get pregnant  Past Medical History:  Diagnosis Date  . Gallstones 2014   Biliary sludge of pregnancy  . Gestational diabetes 2010  . Renal disorder 2008   kidney stones   Past Surgical History:  Procedure Laterality Date  . CESAREAN SECTION  2008  . EYE SURGERY Right 2012   ?removal of chalazion--[facility]   Social History   Social History  . Marital status: Married    Spouse name: [name]  . Number of children: 3  . Years of education: 12   Occupational History  . 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 Smoking status: Never Smoker  . Smokeless tobacco: Never Used  . Alcohol use No  . Drug use: No  . Sexual activity: Yes    Birth control/ protection: None   Other Topics Concern  . Not on file   Social History Narrative   Originally from [place]   Came to [place] In 2004   Lives on [address] in [place] with husband and 3 daughters   Girls go to [employer]      Family History  Problem Relation Age of Onset  . Diabetes Mother   . Diabetes Father   . Asthma Daughter      Review of Systems  Constitutional: Positive for fatigue.       Mildly decreased Sleeps a lot  HENT: Positive for dental problem. Negative for ear pain, hearing loss and sore throat.        Has appt. With dental clinic next week  Eyes:  Positive for visual disturbance.       Cannot see well. Would be willing to pay extra to get into optometrist sooner  Respiratory: Negative for shortness of breath.   Cardiovascular: Negative for chest pain, palpitations and leg swelling.  Gastrointestinal: Negative for abdominal pain, blood in stool and nausea.       No melena  Endocrine: Negative for polydipsia and polyuria.  Genitourinary: Negative for dysuria, hematuria and vaginal discharge.  Musculoskeletal: Negative for arthralgias.  Skin: Negative for rash.  Neurological: Negative for weakness and numbness.  Psychiatric/Behavioral: Negative for dysphoric mood. The patient is nervous/anxious.        Has never worked with our Social worker, Macie Burows   Depression screen [facility] 2/9 12/14/2015  Decreased Interest 0  Down, Depressed, Hopeless 0  PHQ - 2 Score 0  Altered sleeping 0  Tired, decreased energy 1  Change in appetite 0  Feeling bad or failure about yourself  0  Trouble concentrating 0  Moving slowly or fidgety/restless 0  Suicidal thoughts 0  PHQ-9 Score 1  Difficult doing work/chores -      Objective:   Physical Exam  Constitutional: She is oriented to person, place, and time.  Obese  HENT:  Head: Normocephalic and atraumatic.  Right Ear: Hearing, tympanic membrane, external ear and ear canal normal.  Left Ear: Hearing, tympanic membrane, external ear and ear canal normal.  Nose: Nose normal.  Mouth/Throat: Uvula is midline, oropharynx is clear and moist and mucous membranes are normal.  Eyes: Conjunctivae and EOM are normal. Pupils are equal, round, and reactive to light.  Discs sharp bilaterally  Neck: Normal range of motion. Neck supple. No thyromegaly present.  Cardiovascular: Normal rate, regular rhythm, S1 normal and S2 normal.  Exam reveals no S3, no S4 and no friction rub.   No murmur heard. Carotid, radial and DP, PT pulses normal and equal  Pulmonary/Chest: Effort normal and breath sounds normal. Right  breast exhibits no inverted nipple, no mass, no nipple discharge, no skin change and no tenderness. Left breast exhibits no inverted nipple, no mass, no nipple discharge, no skin change and no tenderness.  Abdominal: Soft. Bowel sounds are normal. She exhibits no mass. There is no hepatosplenomegaly. There is no tenderness. No hernia.  Genitourinary: Vagina normal. Uterus is not enlarged and not tender. Cervix exhibits no motion tenderness. Right adnexum displays no mass and no tenderness. Left adnexum displays no mass and no tenderness. No vaginal discharge found.  Musculoskeletal: Normal range of motion.  Lymphadenopathy:       Head (right side): No submental and no submandibular adenopathy present.       Head (left side): No submental and no submandibular adenopathy present.    She has no cervical adenopathy.    She has no axillary adenopathy.       Right: No inguinal and no supraclavicular adenopathy present.       Left: No inguinal and no supraclavicular adenopathy present.  Neurological: She is alert and oriented to person, place, and time. She has normal strength and normal reflexes. No cranial nerve deficit or sensory deficit. Coordination and gait normal.  Skin: Skin is warm and dry. No lesion and no rash noted.  Psychiatric: Her speech is normal and behavior is normal. Thought content normal.          Assessment & Plan:  1.  CPE with pap Need influenza:  We are currently out. CBC, CMP, FLP, A1C with history of anemia, gestational diabetes, obesity. Patient would like to be checked for pregnancy today. Encouraged flu vaccine.  We are currently out.  To check back with [place] or obtain at [facility]  2.  Anxiety:  Referral to Macie Burows, LCSW.

## 2015-12-15 LAB — CBC WITH DIFFERENTIAL/PLATELET
BASOS ABS: 0 10*3/uL (ref 0.0–0.2)
BASOS: 0 %
EOS (ABSOLUTE): 0.2 10*3/uL (ref 0.0–0.4)
Eos: 3 %
Hematocrit: 38.7 % (ref 34.0–46.6)
Hemoglobin: 13.2 g/dL (ref 11.1–15.9)
Immature Grans (Abs): 0 10*3/uL (ref 0.0–0.1)
Immature Granulocytes: 0 %
LYMPHS ABS: 2.5 10*3/uL (ref 0.7–3.1)
Lymphs: 27 %
MCH: 29.8 pg (ref 26.6–33.0)
MCHC: 34.1 g/dL (ref 31.5–35.7)
MCV: 87 fL (ref 79–97)
MONOS ABS: 0.5 10*3/uL (ref 0.1–0.9)
Monocytes: 5 %
NEUTROS ABS: 6 10*3/uL (ref 1.4–7.0)
Neutrophils: 65 %
PLATELETS: 284 10*3/uL (ref 150–379)
RBC: 4.43 x10E6/uL (ref 3.77–5.28)
RDW: 13.7 % (ref 12.3–15.4)
WBC: 9.3 10*3/uL (ref 3.4–10.8)

## 2015-12-15 LAB — HGB A1C W/O EAG: Hgb A1c MFr Bld: 6.4 % — ABNORMAL HIGH (ref 4.8–5.6)

## 2015-12-15 LAB — CYTOLOGY - PAP

## 2015-12-15 LAB — LIPID PANEL W/O CHOL/HDL RATIO
CHOLESTEROL TOTAL: 188 mg/dL (ref 100–199)
HDL: 45 mg/dL (ref >39)
LDL Calculated: 126 mg/dL — ABNORMAL HIGH (ref 0–99)
Triglycerides: 85 mg/dL (ref 0–149)
VLDL Cholesterol Cal: 17 mg/dL (ref 5–40)

## 2015-12-21 ENCOUNTER — Other Ambulatory Visit: Payer: Self-pay | Admitting: Licensed Clinical Social Worker

## 2015-12-28 ENCOUNTER — Ambulatory Visit (INDEPENDENT_AMBULATORY_CARE_PROVIDER_SITE_OTHER): Payer: Self-pay | Admitting: Licensed Clinical Social Worker

## 2015-12-28 DIAGNOSIS — F419 Anxiety disorder, unspecified: Secondary | ICD-10-CM
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
## 2016-01-11 ENCOUNTER — Other Ambulatory Visit: Payer: Self-pay | Admitting: Licensed Clinical Social Worker

## 2016-03-29 ENCOUNTER — Ambulatory Visit (INDEPENDENT_AMBULATORY_CARE_PROVIDER_SITE_OTHER): Payer: Self-pay | Admitting: Internal Medicine

## 2016-03-29 ENCOUNTER — Encounter: Payer: Self-pay | Admitting: Internal Medicine

## 2016-03-29 VITALS — BP 110/74 | HR 78 | Resp 12 | Ht 60.0 in | Wt 187.0 lb

## 2016-03-29 DIAGNOSIS — E669 Obesity, unspecified: Secondary | ICD-10-CM | POA: Insufficient documentation

## 2016-03-29 DIAGNOSIS — IMO0001 Reserved for inherently not codable concepts without codable children: Secondary | ICD-10-CM

## 2016-03-29 DIAGNOSIS — R7303 Prediabetes: Secondary | ICD-10-CM

## 2016-03-29 DIAGNOSIS — Z6836 Body mass index (BMI) 36.0-36.9, adult: Secondary | ICD-10-CM

## 2016-03-29 DIAGNOSIS — E6609 Other obesity due to excess calories: Secondary | ICD-10-CM

## 2016-03-29 NOTE — Patient Instructions (Addendum)
Tome un vaso de agua antes de cada comida Tome un minimo de 6 a 8 vasos de agua diarios Coma tres veces al dia Coma una proteina y Ardelia Mems grasa saludable con comida.  (huevos, pescado, pollo, pavo, y limite carnes rojas Coma 5 porciones diarias de legumbres.  Mezcle los colores Coma 2 porciones diarias de frutas con cascara cuando sea comestible Use platos pequeos Suelte su tenedor o cuchara despues de cada mordida hata que se mastique y se trague Come en la mesa con amigos o familiares por lo menos una vez al dia Apague la televisin y aparatos electrnicos durante la comida  Su objetivo debe ser perder Ardelia Mems libra por semana  With Susie Piper 10 a.m. Next Thursday, Feb 24th

## 2016-03-29 NOTE — Progress Notes (Signed)
   Subjective:    Patient ID: Vanessa Henson, female    DOB: May 03, 1985, 31 y.o.   MRN: [id number]  HPI   Here today to discuss prediabetes/obesity. A1C following CPE in November was 6.4% Long discussion regarding lifestyle changes with diet and physical activity.       Review of Systems     Objective:   Physical Exam  Weight up to 187 lbs, BMI 36.52      Assessment & Plan:  Obesity with prediabetes:  Face to face discussion for 30 minutes regarding improving diet and gradually increasing physical activity with good goal making. Will also have her meet with Susie Piper next Thursday at 10 a.m. To discuss healthy eating.

## 2016-04-26 ENCOUNTER — Other Ambulatory Visit: Payer: Self-pay

## 2016-06-19 ENCOUNTER — Ambulatory Visit (INDEPENDENT_AMBULATORY_CARE_PROVIDER_SITE_OTHER): Payer: Self-pay | Admitting: Internal Medicine

## 2016-06-19 ENCOUNTER — Encounter: Payer: Self-pay | Admitting: Internal Medicine

## 2016-06-19 ENCOUNTER — Other Ambulatory Visit: Payer: Self-pay

## 2016-06-19 VITALS — BP 102/68 | HR 86 | Temp 98.6°F | Resp 12 | Ht 60.0 in | Wt 186.0 lb

## 2016-06-19 DIAGNOSIS — J029 Acute pharyngitis, unspecified: Secondary | ICD-10-CM

## 2016-06-19 LAB — POCT RAPID STREP A (OFFICE): RAPID STREP A SCREEN: NEGATIVE

## 2016-06-19 MED ORDER — AZITHROMYCIN 250 MG PO TABS: ORAL_TABLET | ORAL | 0 refills | Status: DC

## 2016-06-19 NOTE — Progress Notes (Signed)
   Subjective:    Patient ID: Vanessa Henson, female    DOB: 18-Jan-1986, 31 y.o.   MRN: [id number]  HPI   Starting yesterday morning, started with a sore throat and fever up to 103.4 orally.  Does have a headache, but no stomachache.  Hard to swallow.  No rash.  Has noted pus today on her throat. Daughter had sore throat last week, but patient states was not diagnosed as strep throat, though was treated with Amoxicillin.  Drinking a little bit.  Not eating much however.  Last Ibuprofen was 600 mg at 7 a.m. This morning.   Husband uses condoms with intercourse.  Current Meds  Medication Sig  . ibuprofen (ADVIL,MOTRIN) 200 MG tablet Take 200 mg by mouth every 6 (six) hours as needed.   Allergies  Allergen Reactions  . Diphenhydramine Hcl Other (See Comments)    Dizziness and shortness of breath.      Review of Systems     Objective:   Physical Exam   NAD HEENT:  PERRL, EOMI, TMs pearly gray, tonsils swollen, red and with white exudate.  MMM. Neck;  Supple, tender mild anterior cervical adenopathy Chest:  CTA CV:  RRR with normal S1 and S2, No S3, S4 or murmur, radial pulses normal and equal. Abd:  S, NT, No HSM or mass, + BS Skin:  No rash   Rapid Strep:  negative     Assessment & Plan:  1.  Exudative Pharyngitis:  Rapid Strep negative, but family with treatment for strep and patient early in course.  Azithromycin 500 mg for 5 days. Ibuprofen as needed for pain and fever.

## 2016-06-26 ENCOUNTER — Ambulatory Visit: Payer: Self-pay | Admitting: Internal Medicine

## 2016-12-04 ENCOUNTER — Ambulatory Visit (INDEPENDENT_AMBULATORY_CARE_PROVIDER_SITE_OTHER): Payer: Self-pay | Admitting: Internal Medicine

## 2016-12-04 ENCOUNTER — Encounter: Payer: Self-pay | Admitting: Internal Medicine

## 2016-12-04 VITALS — BP 122/80 | HR 78 | Resp 12 | Ht 60.0 in | Wt 186.0 lb

## 2016-12-04 DIAGNOSIS — N644 Mastodynia: Secondary | ICD-10-CM

## 2016-12-04 DIAGNOSIS — Z23 Encounter for immunization: Secondary | ICD-10-CM

## 2016-12-04 LAB — POCT URINE PREGNANCY: Preg Test, Ur: NEGATIVE

## 2016-12-04 MED ORDER — INFLUENZA VAC SPLIT QUAD 0.5 ML IM SUSY: 0.5000 mL | PREFILLED_SYRINGE | Freq: Once | INTRAMUSCULAR | 0 refills | Status: DC

## 2016-12-04 NOTE — Progress Notes (Signed)
   Subjective:    Patient ID: Vanessa Henson, female    DOB: 08-15-1985, 31 y.o.   MRN: [id number]  HPI   1.  HM:  Has not had influenza vaccine.  2.  Right breast discomfort:  3 weeks with "weird"  Sensation in her right breast.  Feels hot.  Intermittent.  Cannot say what brings it on.  Does not wake her from sleep at night.  States 15 days ago, when had first episode--came on suddenly and very hot for 3 hours.  Could not get to sleep.  Took Tylenol about an hour after pain started.  She feels it helped. Since then, episodes come and go throughout the day and really only lasting 2-3 minutes.  No shortness of breath associated with this--feels the discomfort was localized to her breast.   No pain or discomfort in her right axilla.   No outpatient prescriptions have been marked as taking for the 12/04/16 encounter (Office Visit) with Mack Hook, MD.   Allergies  Allergen Reactions  . Diphenhydramine Hcl Other (See Comments)    Dizziness and shortness of breath.      Review of Systems     Objective:   Physical Exam NAD Lungs:  CTA Breasts:  No focal mass, skin dimpling, nipple discharge, or axillary adenopathy of either breast.  Mild tenderness at about 12 to 1 O'Clock  Approximately 2 cm above areola without palpable abnormality. CV:  RRR with normal S1 and S2, No S3, S4 or murmur.  Radial and DP pulses normal and equal.       Assessment & Plan:  1.  Right breast discomfort with mild tenderness on exam. Schedule mammogram/ultrasound.  2.  HM:  Did not get printed Rx for Influenza immunization to patient.  Will have her pick up. Sending her to [facility] for a free influenza vaccine left over from orange card sign up/[facility]

## 2016-12-04 NOTE — Patient Instructions (Signed)
Ibuprofen 200 mg 2-4 pastillas con comida cada 6 horas a necesita de dolor.

## 2016-12-05 MED ORDER — INFLUENZA VAC SPLIT QUAD 0.5 ML IM SUSY: 0.5000 mL | PREFILLED_SYRINGE | Freq: Once | INTRAMUSCULAR | 0 refills | Status: AC

## 2016-12-06 ENCOUNTER — Telehealth: Payer: Self-pay | Admitting: Internal Medicine

## 2016-12-06 NOTE — Telephone Encounter (Signed)
Patient contacted today and is informed that Flu vaccine Rx is here at office ready for pick up and she just needs to take it to [facility] on [address].  Patient to come in today and pick up.

## 2017-02-13 ENCOUNTER — Telehealth: Payer: Self-pay | Admitting: Internal Medicine

## 2017-02-13 NOTE — Telephone Encounter (Signed)
Patient came in to inquire about her [facility] referral from Oct. States she has not heard back from anybody and is still having discomfort. Please check on referral status

## 2017-02-18 NOTE — Telephone Encounter (Signed)
Left message for kristen with [facility] to call patient and schedule appointment. Referral faxed to them as well.

## 2017-06-06 ENCOUNTER — Ambulatory Visit: Payer: Self-pay | Admitting: Internal Medicine

## 2017-06-06 ENCOUNTER — Encounter: Payer: Self-pay | Admitting: Internal Medicine

## 2017-06-06 VITALS — BP 112/82 | HR 80 | Temp 98.1°F | Resp 12 | Ht 60.0 in | Wt 192.0 lb

## 2017-06-06 DIAGNOSIS — J302 Other seasonal allergic rhinitis: Secondary | ICD-10-CM

## 2017-06-06 MED ORDER — PREDNISONE 5 MG PO TABS: ORAL_TABLET | ORAL | 0 refills | Status: DC

## 2017-06-06 MED ORDER — FLUTICASONE PROPIONATE 50 MCG/ACT NA SUSP: 2.0000 | Freq: Every day | NASAL | 6 refills | Status: DC

## 2017-06-06 MED ORDER — FEXOFENADINE HCL 180 MG PO TABS: 180.0000 mg | ORAL_TABLET | Freq: Every day | ORAL | Status: DC

## 2017-06-06 NOTE — Progress Notes (Signed)
   Subjective:    Patient ID: Vanessa Henson, female    DOB: 1985-11-11, 32 y.o.   MRN: [id number]  HPI   2 days of cough and sore throat as well as chest congestion.  Yesterday she felt she had a fever, maybe this morning.  Lost her voice yesterday.  Yesterday, her eyes were very swollen and red. Eyes, nose and throat are itchy and watering. Does feel a bit dyspneic. Right ear with pain starting yesterday.  Hearing is fine.   3 daughters at home with similar symptoms.   Noted these symptoms starting after mowing the lawn a couple of days ago. Has had problems with allergies in spring before, but not this bad. Has been using a constellation of antihistamines, decongestants, generic Afrin without much in way of improvement. Tried Sports coach.  Current Meds  Medication Sig  . ibuprofen (ADVIL,MOTRIN) 200 MG tablet Take 200 mg by mouth every 6 (six) hours as needed.   Allergies  Allergen Reactions  . Diphenhydramine Hcl Other (See Comments)    Dizziness and shortness of breath.    Review of Systems     Objective:   Physical Exam Quite hoarse. HEENT:  PERRL, EOMI, conjunctivae injected bilaterally, TMs dull, but without injection.  Nasal mucosa swollen and red, throat with some cobbling.  No tonsillar exudate.  Mild tenderness over maxillary sinuses Neck:  Supple, no adenopathy Chest:  CTA CV:  RRR without murmur or rub.       Assessment & Plan:  1.  Seasonal allergies vs. Viral URI:  Prednisone 40 mg daily for 5 days.  To get started daily on Fexofenadine 180 mg daily and Fluticasone nasal 2 sprays each nostril daily. Call if worsens.

## 2017-06-06 NOTE — Patient Instructions (Signed)
Habla clinica si mas mal.

## 2017-06-27 ENCOUNTER — Other Ambulatory Visit (HOSPITAL_COMMUNITY): Payer: Self-pay | Admitting: *Deleted

## 2017-06-27 DIAGNOSIS — N644 Mastodynia: Secondary | ICD-10-CM

## 2017-07-17 ENCOUNTER — Encounter (HOSPITAL_COMMUNITY): Payer: Self-pay

## 2017-07-17 ENCOUNTER — Encounter (HOSPITAL_COMMUNITY): Payer: Self-pay | Admitting: *Deleted

## 2017-07-17 ENCOUNTER — Ambulatory Visit: Payer: Self-pay

## 2017-07-17 ENCOUNTER — Ambulatory Visit (HOSPITAL_COMMUNITY)
Admission: RE | Admit: 2017-07-17 | Discharge: 2017-07-17 | Disposition: A | Discharge status: Home / Self Care | Payer: Self-pay | Source: Ambulatory Visit | Attending: Obstetrics and Gynecology | Admitting: Obstetrics and Gynecology

## 2017-07-17 ENCOUNTER — Ambulatory Visit
Admission: RE | Admit: 2017-07-17 | Discharge: 2017-07-17 | Disposition: A | Discharge status: Home / Self Care | Payer: No Typology Code available for payment source | Source: Ambulatory Visit | Attending: Obstetrics and Gynecology | Admitting: Obstetrics and Gynecology

## 2017-07-17 VITALS — BP 114/76 | Wt 189.2 lb

## 2017-07-17 DIAGNOSIS — N644 Mastodynia: Secondary | ICD-10-CM

## 2017-07-17 DIAGNOSIS — Z1239 Encounter for other screening for malignant neoplasm of breast: Secondary | ICD-10-CM

## 2017-07-17 NOTE — Patient Instructions (Signed)
Explained breast self awareness with [name]. Patient did not need a Pap smear today due to last Pap smear was 12/14/2015. Let her know [facility] will cover Pap smears every 3 years unless has a history of abnormal Pap smears. Referred patient to the [facility] for a diagnostic mammogram. Appointment scheduled for Thursday, July 17, 2017 at 1450. Vanessa Henson verbalized understanding.  [place], Arvil Chaco, RN 3:30 PM

## 2017-07-17 NOTE — Progress Notes (Signed)
Complaints of left breast pain x 1 year that comes and goes. Patient rates the pain at a 8-9 out of 10.  Pap Smear: Pap smear not completed today. Last Pap smear was 12/14/2015 at the [facility] and normal. Per patient has no history of an abnormal Pap smear. Last Pap smear result is in Epic.  Physical exam: Breasts Breasts symmetrical. No skin abnormalities bilateral breasts. No nipple retraction bilateral breasts. No nipple discharge bilateral breasts. No lymphadenopathy. No lumps palpated bilateral breasts. No complaints of pain or tenderness on exam. Referred patient to the [facility] for a diagnostic mammogram. Appointment scheduled for Thursday, July 17, 2017 at 1450.        Pelvic/Bimanual No Pap smear completed today since last Pap smear was 12/14/2015. Pap smear not indicated per [facility] guidelines.   Smoking History: Patient has never smoked.  Patient Navigation: Patient education provided. Access to services provided for patient through [facility] program. Spanish interpreter provided.   Breast and Cervical Cancer Risk Assessment: Patient has no family history of breast cancer, known genetic mutations, or radiation treatment to the chest before age 56. Patient has no history of cervical dysplasia, immunocompromised, or DES exposure in-utero. Breast Cancer risk assessment completed. Unable to calculate breast cancer risk due to patient being under 73 years-old.  Used Spanish interpreter [employer] from [place].

## 2018-06-02 ENCOUNTER — Encounter: Payer: Self-pay | Admitting: Internal Medicine

## 2018-07-21 ENCOUNTER — Encounter: Payer: Self-pay | Admitting: Internal Medicine

## 2018-09-14 ENCOUNTER — Encounter: Payer: Self-pay | Admitting: Internal Medicine

## 2018-09-30 ENCOUNTER — Encounter: Payer: Self-pay | Admitting: Internal Medicine

## 2018-10-29 ENCOUNTER — Encounter: Payer: Self-pay | Admitting: Internal Medicine

## 2018-11-13 ENCOUNTER — Encounter: Payer: Self-pay | Admitting: Internal Medicine

## 2018-11-13 ENCOUNTER — Other Ambulatory Visit: Payer: Self-pay

## 2018-11-13 ENCOUNTER — Other Ambulatory Visit: Payer: Self-pay | Admitting: Internal Medicine

## 2018-11-13 ENCOUNTER — Ambulatory Visit (INDEPENDENT_AMBULATORY_CARE_PROVIDER_SITE_OTHER): Payer: Self-pay | Admitting: Internal Medicine

## 2018-11-13 VITALS — BP 102/68 | HR 70 | Resp 12 | Ht 60.0 in | Wt 186.0 lb

## 2018-11-13 DIAGNOSIS — Z0000 Encounter for general adult medical examination without abnormal findings: Secondary | ICD-10-CM

## 2018-11-13 DIAGNOSIS — R7303 Prediabetes: Secondary | ICD-10-CM

## 2018-11-13 DIAGNOSIS — J302 Other seasonal allergic rhinitis: Secondary | ICD-10-CM

## 2018-11-13 DIAGNOSIS — E66812 Obesity, class 2: Secondary | ICD-10-CM

## 2018-11-13 DIAGNOSIS — E669 Obesity, unspecified: Secondary | ICD-10-CM

## 2018-11-13 DIAGNOSIS — Z6836 Body mass index (BMI) 36.0-36.9, adult: Secondary | ICD-10-CM

## 2018-11-13 DIAGNOSIS — Z124 Encounter for screening for malignant neoplasm of cervix: Secondary | ICD-10-CM

## 2018-11-13 MED ORDER — FLUTICASONE PROPIONATE 50 MCG/ACT NA SUSP: 2.0000 | Freq: Every day | NASAL | 6 refills | Status: DC

## 2018-11-13 NOTE — Patient Instructions (Signed)
Flu clinics on  Oct 15 8:30 a.m. to 11:30 a.m.    Tome un vaso de agua antes de cada comida Tome un minimo de 6 a 8 vasos de agua diarios Coma tres veces al dia Coma una proteina y Ardelia Mems grasa saludable con comida.  (huevos, pescado, pollo, pavo, y limite carnes rojas Coma 5 porciones diarias de legumbres.  Mezcle los colores Coma 2 porciones diarias de frutas con cascara cuando sea comestible Use platos pequeos Suelte su tenedor o cuchara despues de cada mordida hata que se mastique y se trague Come en la mesa con amigos o familiares por lo menos una vez al dia Apague la televisin y aparatos electrnicos durante la comida  Su objetivo debe ser perder una libra por semana  Estudios recientes indican que las personas quienes consumen todos de sus calorias durante 12 horas se bajan de pesocon Mas eficiencia.  Por ejemplo, si Usted come su primera comida a las 7:00 a.m., su comida final del dia se debe completar antes de las 7:00 p.m.

## 2018-11-13 NOTE — Progress Notes (Signed)
Subjective:    Patient ID: Vanessa Henson, female   DOB: 12/17/1985, 33 y.o.   MRN: [id number]   HPI   CPE with pap  1.  Pap:  Last pap was 12/2015 and normal.    2.  Mammogram:  Had diagnostic mammogram 07/17/2017 and normal.  No family history of breast cancer.  3.  Osteoprevention:  Does not intake dairy daily, but some milk and yogurt.  Willing to drink 3 cups of 2% milk daily.  Not physically active daily.  May go for a walk twice weekly.    4.  Guaiac Cards:  Never.   5.  Colonoscopy:  Never.  No family history of colon cancer.  6.  Immunizations:  Immunization History  Administered Date(s) Administered  . Influenza Split 12/09/2014  . Tdap 05/08/2012     7.  Glucose/Cholesterol:  Has not been in for follow up with prediabetes in some time.  Last A1C was 6.4% in 2017.  LDL was 126 in 2017, rest of cholesterol panel at goal.  Lipid Panel     Component Value Date/Time   CHOL 188 12/14/2015 1027   TRIG 85 12/14/2015 1027   HDL 45 12/14/2015 1027   LDLCALC 126 (H) 12/14/2015 1027   LABVLDL 17 12/14/2015 1027        Current Meds  Medication Sig  . fluticasone (FLONASE) 50 MCG/ACT nasal spray Place 2 sprays into both nostrils daily.  Marland Kitchen ibuprofen (ADVIL,MOTRIN) 200 MG tablet Take 200 mg by mouth every 6 (six) hours as needed.   Allergies  Allergen Reactions  . Diphenhydramine Hcl Other (See Comments)    Dizziness and shortness of breath.   Past Medical History:  Diagnosis Date  . Gallstones 2014   Biliary sludge of pregnancy  . Gestational diabetes 2010  . Prediabetes 2017  . Renal disorder 2008   kidney stones   Past Surgical History:  Procedure Laterality Date  . CESAREAN SECTION  2008  . EYE SURGERY Right 2012   ?removal of [place]ion[place]   Family History  Problem Relation Age of Onset  . Diabetes Father   . Hypertension Father   . Heart disease Father        MI  . Eczema Daughter   . Asthma Daughter    Social History    Socioeconomic History  . Marital status: Married    Spouse name: Rosalio Loud  . Number of children: 3  . Years of education: 7  . Highest education level: Not on file  Occupational History  . 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 Financial resource strain: Not hard at all  . Food insecurity    Worry: Never true    Inability: Never true  . Transportation needs    Medical: No    Non-medical: No  Tobacco Use  . Smoking status: Never Smoker  . Smokeless tobacco: Never Used  Substance and Sexual Activity  . Alcohol use: No  . Drug use: No  . Sexual activity: Yes    Birth control/protection: Condom  Lifestyle  . Physical activity    Days per week: 2 days    Minutes per session: 30 min  . Stress: Only a little  Relationships  . Social Herbalist on phone: Not on file    Gets together: Not on file    Attends religious service: Not on file    Active member of club or organization: Not on file  Attends meetings of clubs or organizations: Not on file    Relationship status: Not on file  . Intimate partner violence    Fear of current or ex partner: No    Emotionally abused: No    Physically abused: No    Forced sexual activity: No  Other Topics Concern  . Not on file  Social History Narrative   Lives on [address] in [place] with husband and 3 daughters   Girls go to [employer]     Review of Systems  Constitutional: Negative for appetite change and fatigue.  HENT: Positive for tinnitus (one day this week, then resolved). Negative for dental problem (Her visit was cancelled with the pandemic to get wisdom tooth out.  Clinic was supposed to call.  Having pain.), ear pain, hearing loss, rhinorrhea, sneezing and sore throat.   Eyes: Negative for visual disturbance.  Respiratory: Negative for cough and shortness of breath.   Cardiovascular: Negative for chest pain, palpitations and leg swelling.  Gastrointestinal: Negative for  abdominal pain, blood in stool (No melena.), constipation and diarrhea.  Genitourinary: Negative for dysuria, frequency and vaginal discharge.  Musculoskeletal: Negative for arthralgias.  Skin: Negative for rash.  Neurological: Negative for weakness and numbness.  Psychiatric/Behavioral: Negative for dysphoric mood. The patient is not nervous/anxious.       Objective:   BP 102/68 (BP Location: Left Arm, Patient Position: Sitting, Cuff Size: Normal)   Pulse 70   Resp 12   Ht 5' (1.524 m)   Wt 186 lb (84.4 kg)   LMP 10/27/2018   BMI 36.33 kg/m   Physical Exam  Constitutional: She is oriented to person, place, and time. She appears well-developed and well-nourished.  HENT:  Head: Normocephalic and atraumatic.  Right Ear: Hearing, tympanic membrane, external ear and ear canal normal.  Left Ear: Hearing, tympanic membrane, external ear and ear canal normal.  Nose: Nose normal.  Mouth/Throat: Uvula is midline, oropharynx is clear and moist and mucous membranes are normal.  Eyes: Pupils are equal, round, and reactive to light. Conjunctivae and EOM are normal.  Pterygium, small, left nasal eye. Discs sharp bilaterally.  Neck: Normal range of motion and full passive range of motion without pain. Neck supple. No thyroid mass and no thyromegaly present.  Cardiovascular: Normal rate, regular rhythm and S2 normal. Exam reveals no S3, no S4 and no friction rub.  No murmur heard. Carotid, radial, femoral, DP and PT pulses normal and equal.   Pulmonary/Chest: Effort normal and breath sounds normal. Right breast exhibits no inverted nipple, no mass, no nipple discharge, no skin change and no tenderness. Left breast exhibits no inverted nipple, no mass, no nipple discharge, no skin change and no tenderness.  Abdominal: Soft. Bowel sounds are normal. She exhibits no mass. There is no hepatosplenomegaly. There is no abdominal tenderness. No hernia.  Genitourinary:    Genitourinary Comments: Normal  external female genitalia. No vaginal discharge.  No cervical or vaginal mucosal lesions. No uterine or adnexal mass or tenderness.   Musculoskeletal: Normal range of motion.  Lymphadenopathy:       Head (right side): No submental and no submandibular adenopathy present.       Head (left side): No submental and no submandibular adenopathy present.    She has no cervical adenopathy.    She has no axillary adenopathy.       Right: No inguinal and no supraclavicular adenopathy present.       Left: No inguinal and no supraclavicular adenopathy  present.  Neurological: She is alert and oriented to person, place, and time. She has normal strength and normal reflexes. No cranial nerve deficit or sensory deficit. Coordination and gait normal.  Skin: Skin is warm. No rash noted.  Psychiatric: She has a normal mood and affect. Her speech is normal and behavior is normal. Judgment and thought content normal. Cognition and memory are normal.     Assessment & Plan   1.  CPE with pap. Return for free influenza vaccine Oct 15 CBC, CMP  2.  Prediabetes/obesity:  A1C, FLP.  To wor on lifestyle changes--diet and physical activity. Follow up in 3 months for weight check.  3.  Allergies:  Fluticasone nasal.

## 2018-11-14 LAB — LIPID PANEL W/O CHOL/HDL RATIO
Cholesterol, Total: 170 mg/dL (ref 100–199)
HDL: 43 mg/dL (ref >39)
LDL Chol Calc (NIH): 112 mg/dL — ABNORMAL HIGH (ref 0–99)
Triglycerides: 79 mg/dL (ref 0–149)
VLDL Cholesterol Cal: 15 mg/dL (ref 5–40)

## 2018-11-14 LAB — COMPREHENSIVE METABOLIC PANEL
ALT: 39 IU/L — ABNORMAL HIGH (ref 0–32)
AST: 22 IU/L (ref 0–40)
Albumin/Globulin Ratio: 1.3 (ref 1.2–2.2)
Albumin: 4.3 g/dL (ref 3.8–4.8)
Alkaline Phosphatase: 142 IU/L — ABNORMAL HIGH (ref 39–117)
BUN/Creatinine Ratio: 14 (ref 9–23)
BUN: 9 mg/dL (ref 6–20)
Bilirubin Total: 0.3 mg/dL (ref 0.0–1.2)
CO2: 26 mmol/L (ref 20–29)
Calcium: 9 mg/dL (ref 8.7–10.2)
Chloride: 100 mmol/L (ref 96–106)
Creatinine, Ser: 0.63 mg/dL (ref 0.57–1.00)
GFR calc Af Amer: 137 mL/min/{1.73_m2} (ref >59)
GFR calc non Af Amer: 119 mL/min/{1.73_m2} (ref >59)
Globulin, Total: 3.3 g/dL (ref 1.5–4.5)
Glucose: 118 mg/dL — ABNORMAL HIGH (ref 65–99)
Potassium: 4.3 mmol/L (ref 3.5–5.2)
Sodium: 141 mmol/L (ref 134–144)
Total Protein: 7.6 g/dL (ref 6.0–8.5)

## 2018-11-14 LAB — CBC WITH DIFFERENTIAL/PLATELET
Basophils Absolute: 0.1 10*3/uL (ref 0.0–0.2)
Basos: 1 %
EOS (ABSOLUTE): 0.2 10*3/uL (ref 0.0–0.4)
Eos: 2 %
Hematocrit: 39.9 % (ref 34.0–46.6)
Hemoglobin: 13.1 g/dL (ref 11.1–15.9)
Immature Grans (Abs): 0 10*3/uL (ref 0.0–0.1)
Immature Granulocytes: 0 %
Lymphocytes Absolute: 2.6 10*3/uL (ref 0.7–3.1)
Lymphs: 26 %
MCH: 28.9 pg (ref 26.6–33.0)
MCHC: 32.8 g/dL (ref 31.5–35.7)
MCV: 88 fL (ref 79–97)
Monocytes Absolute: 0.4 10*3/uL (ref 0.1–0.9)
Monocytes: 4 %
Neutrophils Absolute: 6.8 10*3/uL (ref 1.4–7.0)
Neutrophils: 67 %
Platelets: 268 10*3/uL (ref 150–450)
RBC: 4.54 x10E6/uL (ref 3.77–5.28)
RDW: 12.4 % (ref 11.7–15.4)
WBC: 10.2 10*3/uL (ref 3.4–10.8)

## 2018-11-14 LAB — HGB A1C W/O EAG: Hgb A1c MFr Bld: 7.4 % — ABNORMAL HIGH (ref 4.8–5.6)

## 2018-11-16 ENCOUNTER — Telehealth: Payer: Self-pay | Admitting: Internal Medicine

## 2018-11-16 NOTE — Telephone Encounter (Signed)
Vanessa Henson at Dental clinic returned call and stated patient last seen on Dec 15, 2017 and was not scheduled or told needed it to come back for anything else after that cleaning visit . Vanessa Henson stated patient has two extractions on Nov. 7 -17, 2017 and  Cancelled  the third one.  Patient orange card is expired and per [facility] patient to call and schedule appointment for extraction after renewing  [employer].  Called pt. And gave information. Patient agreed and verbalized understanding.

## 2018-11-17 LAB — CYTOLOGY - PAP

## 2018-11-18 ENCOUNTER — Other Ambulatory Visit: Payer: Self-pay | Admitting: Internal Medicine

## 2018-11-18 DIAGNOSIS — E119 Type 2 diabetes mellitus without complications: Secondary | ICD-10-CM

## 2018-11-18 DIAGNOSIS — J302 Other seasonal allergic rhinitis: Secondary | ICD-10-CM | POA: Insufficient documentation

## 2018-11-18 MED ORDER — METFORMIN HCL 500 MG PO TABS: 500.0000 mg | ORAL_TABLET | Freq: Two times a day (BID) | ORAL | 11 refills | Status: DC

## 2018-11-18 NOTE — Progress Notes (Signed)
Starting Metformin for Diabetes--new diagnosis.

## 2019-02-09 ENCOUNTER — Other Ambulatory Visit: Payer: Self-pay

## 2019-02-09 ENCOUNTER — Other Ambulatory Visit (INDEPENDENT_AMBULATORY_CARE_PROVIDER_SITE_OTHER): Payer: Self-pay

## 2019-02-09 DIAGNOSIS — Z79899 Other long term (current) drug therapy: Secondary | ICD-10-CM

## 2019-02-09 DIAGNOSIS — Z13220 Encounter for screening for lipoid disorders: Secondary | ICD-10-CM

## 2019-02-09 DIAGNOSIS — E119 Type 2 diabetes mellitus without complications: Secondary | ICD-10-CM

## 2019-02-10 LAB — LIPID PANEL W/O CHOL/HDL RATIO
Cholesterol, Total: 174 mg/dL (ref 100–199)
HDL: 56 mg/dL (ref >39)
LDL Chol Calc (NIH): 106 mg/dL — ABNORMAL HIGH (ref 0–99)
Triglycerides: 64 mg/dL (ref 0–149)
VLDL Cholesterol Cal: 12 mg/dL (ref 5–40)

## 2019-02-10 LAB — HEPATIC FUNCTION PANEL
ALT: 35 IU/L — ABNORMAL HIGH (ref 0–32)
AST: 24 IU/L (ref 0–40)
Albumin: 4.7 g/dL (ref 3.8–4.8)
Alkaline Phosphatase: 127 IU/L — ABNORMAL HIGH (ref 39–117)
Bilirubin Total: 0.5 mg/dL (ref 0.0–1.2)
Bilirubin, Direct: 0.13 mg/dL (ref 0.00–0.40)
Total Protein: 7.6 g/dL (ref 6.0–8.5)

## 2019-02-10 LAB — HGB A1C W/O EAG: Hgb A1c MFr Bld: 5.8 % — ABNORMAL HIGH (ref 4.8–5.6)

## 2019-02-15 ENCOUNTER — Ambulatory Visit (INDEPENDENT_AMBULATORY_CARE_PROVIDER_SITE_OTHER): Payer: Self-pay | Admitting: Internal Medicine

## 2019-02-15 ENCOUNTER — Encounter: Payer: Self-pay | Admitting: Internal Medicine

## 2019-02-15 ENCOUNTER — Other Ambulatory Visit: Payer: Self-pay

## 2019-02-15 VITALS — BP 102/62 | HR 80 | Resp 12 | Ht 60.0 in | Wt 168.0 lb

## 2019-02-15 DIAGNOSIS — E119 Type 2 diabetes mellitus without complications: Secondary | ICD-10-CM

## 2019-02-15 DIAGNOSIS — E7800 Pure hypercholesterolemia, unspecified: Secondary | ICD-10-CM

## 2019-02-15 DIAGNOSIS — Z23 Encounter for immunization: Secondary | ICD-10-CM

## 2019-02-15 NOTE — Progress Notes (Signed)
    Subjective:    Patient ID: Vanessa Henson, female   DOB: Aug 06, 1985, 34 y.o.   MRN: [id number]   HPI   1.  DM:  Stopped drinking soda and sweet stuff.  Running every day.  Takes her kids with her and then the stop and play on the playground.  She is also limiting her serving sizes with meals and not going back for seconds.  Has lost 20+ lbs, even over the holidays. Feels great. Discussed her A1C dropped from 7.4% in October to 5.8% now. Her cholesterol panel is in general better, though LDL still about 100 and goal is under 70. Discussed how to look at nutrition facts on food and make sure she is eating fewer carbs, more fiber and good fats and proteins.  Current Meds  Medication Sig  . fexofenadine (ALLEGRA) 180 MG tablet Take 1 tablet (180 mg total) by mouth daily.  . fluticasone (FLONASE) 50 MCG/ACT nasal spray Place 2 sprays into both nostrils daily.  Marland Kitchen ibuprofen (ADVIL,MOTRIN) 200 MG tablet Take 200 mg by mouth every 6 (six) hours as needed.  . metFORMIN (GLUCOPHAGE) 500 MG tablet Take 1 tablet (500 mg total) by mouth 2 (two) times daily with a meal.   Allergies  Allergen Reactions  . Diphenhydramine Hcl Other (See Comments)    Dizziness and shortness of breath.     Review of Systems    Objective:   BP 102/62 (BP Location: Left Arm, Patient Position: Sitting, Cuff Size: Normal)   Pulse 80   Resp 12   Ht 5' (1.524 m)   Wt 168 lb (76.2 kg)   LMP 01/02/2019   BMI 32.81 kg/m   Physical Exam  NAD Lungs:  CTA CV:  RRR without murmur or rub.  Radial pulses normal and equal LE:  No edema   Assessment & Plan   1.  DM:  significant improvement with lifestyle changes.  Return in another 3 months and recheck A1C.  If in normal range, discontinue Metformin and continue lifestyle changes for life.  2.  Elevated LDL:  Recheck FLP in 3 months as well.  No medication with her improvement.  3.  HM:  Pneumococcal 23 valent.  Encouraged looking for a free influenza  vaccine.  If we ultimately get more in, will notify.   Also to obtain in October yearly.

## 2019-03-20 ENCOUNTER — Encounter: Payer: Self-pay | Admitting: Internal Medicine

## 2019-05-13 ENCOUNTER — Other Ambulatory Visit: Payer: Self-pay

## 2019-05-17 ENCOUNTER — Other Ambulatory Visit: Payer: Self-pay

## 2019-05-19 ENCOUNTER — Ambulatory Visit: Payer: Self-pay | Admitting: Internal Medicine

## 2019-07-29 ENCOUNTER — Other Ambulatory Visit: Payer: Self-pay

## 2019-07-29 ENCOUNTER — Other Ambulatory Visit (INDEPENDENT_AMBULATORY_CARE_PROVIDER_SITE_OTHER): Payer: Self-pay

## 2019-07-29 DIAGNOSIS — E119 Type 2 diabetes mellitus without complications: Secondary | ICD-10-CM

## 2019-07-29 DIAGNOSIS — E7800 Pure hypercholesterolemia, unspecified: Secondary | ICD-10-CM

## 2019-07-30 LAB — LIPID PANEL W/O CHOL/HDL RATIO
Cholesterol, Total: 176 mg/dL (ref 100–199)
HDL: 67 mg/dL (ref >39)
LDL Chol Calc (NIH): 100 mg/dL — ABNORMAL HIGH (ref 0–99)
Triglycerides: 44 mg/dL (ref 0–149)
VLDL Cholesterol Cal: 9 mg/dL (ref 5–40)

## 2019-07-30 LAB — HGB A1C W/O EAG: Hgb A1c MFr Bld: 5.4 % (ref 4.8–5.6)

## 2019-09-07 ENCOUNTER — Telehealth (INDEPENDENT_AMBULATORY_CARE_PROVIDER_SITE_OTHER): Payer: Self-pay | Admitting: Internal Medicine

## 2019-09-07 VITALS — Wt 129.0 lb

## 2019-09-07 DIAGNOSIS — E119 Type 2 diabetes mellitus without complications: Secondary | ICD-10-CM

## 2019-09-07 DIAGNOSIS — E7800 Pure hypercholesterolemia, unspecified: Secondary | ICD-10-CM

## 2019-09-07 NOTE — Progress Notes (Signed)
    Subjective:    Patient ID: Vanessa Henson, female   DOB: [birth year], 34 y.o.   MRN: [id number]   HPI   1.  DM:  Today, states she never realized she was to be taking Metformin starting back in October.  Apparently, has never taken, though was our understanding she was taking at last visit in January when she had dropped her A1C to 5.8%.  07/29/2019 A1C now in normal range at 5.4%. again, without medication. She has lost 60 lbs with increased physical activity.  Down to 129 lbs now.   Describes a very healthy diet.  She feels drinking oatmeal water.  2.  Elevated LDL:  Down to 100  Lipid Panel     Component Value Date/Time   CHOL 176 07/29/2019 0843   TRIG 44 07/29/2019 0843   HDL 67 07/29/2019 0843   LDLCALC 100 (H) 07/29/2019 0843   LABVLDL 9 07/29/2019 0843      No outpatient medications have been marked as taking for the 09/07/19 encounter (Telemedicine) with Mack Hook, MD.   Allergies  Allergen Reactions  . Diphenhydramine Hcl Other (See Comments)    Dizziness and shortness of breath.     Review of Systems    Objective:   There were no vitals taken for this visit.  Physical Exam   Looks great!   Assessment & Plan  1.  Previously diagnosed DM:  With 60 lb weight loss due to excellent lifestyle changes, she is now in normal range with A1C.  CPM  2.  Elevated LDL:  While not at goal for someone with DM, she is now with normal blood glucose as above.  No concern with her otherwise excellent cholesterol panel.

## 2019-12-08 ENCOUNTER — Encounter: Payer: Self-pay | Admitting: Internal Medicine

## 2019-12-08 ENCOUNTER — Ambulatory Visit (INDEPENDENT_AMBULATORY_CARE_PROVIDER_SITE_OTHER): Payer: Self-pay | Admitting: Internal Medicine

## 2019-12-08 VITALS — BP 90/55 | HR 58 | Resp 12 | Ht 60.0 in | Wt 129.0 lb

## 2019-12-08 DIAGNOSIS — Z23 Encounter for immunization: Secondary | ICD-10-CM

## 2019-12-08 DIAGNOSIS — E119 Type 2 diabetes mellitus without complications: Secondary | ICD-10-CM

## 2019-12-08 DIAGNOSIS — Z0000 Encounter for general adult medical examination without abnormal findings: Secondary | ICD-10-CM

## 2019-12-08 NOTE — Progress Notes (Signed)
Subjective:    Patient ID: Vanessa Henson, female   DOB: 1985/09/09, 34 y.o.   MRN: [id number]   HPI   CPE without pap  1.  Pap:  Last pap 11/2018 and normal.    2.  Mammogram:  2019 for breast pain on left and normal.  No breast pain any longer.  No family history of breast cancer.    3.  Osteoprevention:  Drinks cow's milk once daily.  Willing to consider almond milk due to decreased calories and not wanting to gain 62 lbs back.    4.  Guaiac Cards:  Never.  5.  Colonoscopy:  Never.  No family history of colon cancer.  6.  Immunizations:  Has not obtained influenza nor COVID vaccination. Immunization History  Administered Date(s) Administered   Influenza Split 12/09/2014   Pneumococcal Polysaccharide-23 02/15/2019   Tdap 05/08/2012     7.  Glucose/Cholesterol:  Lost 62 lbs about 1 year ago and her A1C normalized to 5.4%.  Was in diabetic range prior. Cholesterol quite good in June. Lipid Panel     Component Value Date/Time   CHOL 176 07/29/2019 0843   TRIG 44 07/29/2019 0843   HDL 67 07/29/2019 0843   LDLCALC 100 (H) 07/29/2019 0843   LABVLDL 9 07/29/2019 0843     Current Meds  Medication Sig   fluticasone (FLONASE) 50 MCG/ACT nasal spray Place 2 sprays into both nostrils daily.   ibuprofen (ADVIL,MOTRIN) 200 MG tablet Take 200 mg by mouth every 6 (six) hours as needed.    Allergies  Allergen Reactions   Diphenhydramine Hcl Other (See Comments)    Dizziness and shortness of breath.   Past Medical History:  Diagnosis Date   DM type 2 (diabetes mellitus, type 2) ([place]) 12/14/2015   Resolved in 2021 with 62 lb weight loss.   Fibroid, uterine 2022   Gallstones 2014   Biliary sludge of pregnancy   Gestational diabetes 2010   Prediabetes 2017   Renal disorder 2008   kidney stones   Past Surgical History:  Procedure Laterality Date   CESAREAN SECTION  2008   EYE SURGERY Right 2012   ?rem[place] of [place]ion[place]   Family History   Problem Relation Age of Onset   Diabetes Mother    Diabetes Father    Hypertension Father    Heart disease Father        MI   Eczema Daughter    Asthma Daughter    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  Tobacco Use   Smoking status: Never   Smokeless tobacco: Never  Vaping Use   Vaping Use: Never used  Substance and Sexual Activity   Alcohol use: No   Drug use: No   Sexual activity: Yes    Birth control/protection: Condom  Other Topics Concern   Not on file  Social History Narrative   Lives on [address] in [place] with husband and 3 daughters   Girls go to [employer]   Social Determinants of Health   Financial Resource Strain: Not on file  Food Insecurity: No Food Insecurity   Worried About Charity fundraiser in the Last Year: Never true   [place] in the Last Year: Never  true  Transportation Needs: No Transportation Needs   Lack of Transportation (Medical): No   Lack of Transportation (Non-Medical): No  Physical Activity: Not on file  Stress: Not on file  Social Connections: Not on file  Intimate Partner Violence: Not on file     Review of Systems  Constitutional: Negative for appetite change, fatigue and fever.  HENT: Negative for dental problem, hearing loss, rhinorrhea, sneezing and sore throat.   Eyes: Negative for visual disturbance.  Respiratory: Negative for cough and shortness of breath.   Cardiovascular: Negative for chest pain, palpitations and leg swelling.  Gastrointestinal: Negative for abdominal pain, blood in stool (No melena.), constipation and diarrhea.  Genitourinary: Negative for dysuria and menstrual problem.  Musculoskeletal: Negative for arthralgias.  Neurological: Negative for weakness, light-headedness  and numbness.  Psychiatric/Behavioral: Negative for dysphoric mood. The patient is not nervous/anxious.       Objective:   BP (!) 90/55 (BP Location: Left Arm, Patient Position: Sitting, Cuff Size: Normal)   Pulse (!) 58   Resp 12   Ht 5' (1.524 m)   Wt 129 lb (58.5 kg)   LMP 11/01/2019 (Exact Date)   BMI 25.19 kg/m   Physical Exam Constitutional:      Appearance: She is normal weight.  HENT:     Head: Normocephalic and atraumatic.     Right Ear: Tympanic membrane, ear canal and external ear normal.     Left Ear: Tympanic membrane, ear canal and external ear normal.     Nose: Nose normal.     Mouth/Throat:     Mouth: Mucous membranes are moist.     Pharynx: Oropharynx is clear.  Eyes:     Extraocular Movements: Extraocular movements intact.     Conjunctiva/sclera: Conjunctivae normal.     Pupils: Pupils are equal, round, and reactive to light.     Comments: Discs sharp bilaterally.  Neck:     Thyroid: No thyroid mass or thyromegaly.  Cardiovascular:     Rate and Rhythm: Normal rate and regular rhythm.     Heart sounds: S1 normal and S2 normal. No murmur heard.   No friction rub. No S3 or S4 sounds.     Comments: Carotid, radial, femoral, DP/PT pulses normal and equal. Pulmonary:     Breath sounds: Normal breath sounds and air entry.  Chest:  Breasts:    Right: No inverted nipple, mass, nipple discharge, skin change or tenderness.     Left: No inverted nipple, mass, nipple discharge, skin change or tenderness.  Abdominal:     General: Bowel sounds are normal.     Palpations: Abdomen is soft. There is no hepatomegaly, splenomegaly or mass.     Tenderness: There is no abdominal tenderness.     Hernia: No hernia is present.  Genitourinary:    Comments: Normal external female genitalia No uterine or adnexal mass or tenderness. Musculoskeletal:        General: Normal range of motion.     Cervical back: Normal range of motion and neck supple.     Right lower leg: No  edema.     Left lower leg: No edema.  Lymphadenopathy:     Head:     Right side of head: No submental or submandibular adenopathy.     Left side of head: No submental or submandibular adenopathy.     Cervical: No cervical adenopathy.     Upper Body:     Right upper body: No supraclavicular or axillary adenopathy.  Left upper body: No supraclavicular or axillary adenopathy.     Lower Body: No right inguinal adenopathy. No left inguinal adenopathy.  Skin:    General: Skin is warm.     Capillary Refill: Capillary refill takes less than 2 seconds.     Findings: No rash.  Neurological:     Mental Status: She is alert.     Cranial Nerves: Cranial nerves are intact.     Sensory: Sensation is intact.     Motor: Motor function is intact.     Coordination: Coordination is intact.     Gait: Gait is intact.     Deep Tendon Reflexes: Reflexes are normal and symmetric.  Psychiatric:        Mood and Affect: Mood normal.        Speech: Speech normal.        Behavior: Behavior normal. Behavior is cooperative.        Thought Content: Thought content normal.     Assessment & Plan   CPE without pap. CBC, CMP, A1C Influenza vaccine  2.  History of DM with significant weight loss and return to normal glucose control.:  A1C.  SW intern to help with orange card sign up.

## 2019-12-09 LAB — CBC WITH DIFFERENTIAL/PLATELET
Basophils Absolute: 0.1 10*3/uL (ref 0.0–0.2)
Basos: 1 %
EOS (ABSOLUTE): 0.2 10*3/uL (ref 0.0–0.4)
Eos: 2 %
Hematocrit: 36.4 % (ref 34.0–46.6)
Hemoglobin: 12.3 g/dL (ref 11.1–15.9)
Immature Grans (Abs): 0 10*3/uL (ref 0.0–0.1)
Immature Granulocytes: 0 %
Lymphocytes Absolute: 2.6 10*3/uL (ref 0.7–3.1)
Lymphs: 26 %
MCH: 30.8 pg (ref 26.6–33.0)
MCHC: 33.8 g/dL (ref 31.5–35.7)
MCV: 91 fL (ref 79–97)
Monocytes Absolute: 0.5 10*3/uL (ref 0.1–0.9)
Monocytes: 5 %
Neutrophils Absolute: 6.6 10*3/uL (ref 1.4–7.0)
Neutrophils: 66 %
Platelets: 235 10*3/uL (ref 150–450)
RBC: 3.99 x10E6/uL (ref 3.77–5.28)
RDW: 11.9 % (ref 11.7–15.4)
WBC: 9.9 10*3/uL (ref 3.4–10.8)

## 2019-12-09 LAB — HGB A1C W/O EAG: Hgb A1c MFr Bld: 5.5 % (ref 4.8–5.6)

## 2019-12-09 LAB — COMPREHENSIVE METABOLIC PANEL
ALT: 10 IU/L (ref 0–32)
AST: 11 IU/L (ref 0–40)
Albumin/Globulin Ratio: 1.8 (ref 1.2–2.2)
Albumin: 4.8 g/dL (ref 3.8–4.8)
Alkaline Phosphatase: 111 IU/L (ref 44–121)
BUN/Creatinine Ratio: 25 — ABNORMAL HIGH (ref 9–23)
BUN: 16 mg/dL (ref 6–20)
Bilirubin Total: 0.2 mg/dL (ref 0.0–1.2)
CO2: 24 mmol/L (ref 20–29)
Calcium: 8.9 mg/dL (ref 8.7–10.2)
Chloride: 103 mmol/L (ref 96–106)
Creatinine, Ser: 0.65 mg/dL (ref 0.57–1.00)
GFR calc Af Amer: 135 mL/min/{1.73_m2} (ref >59)
GFR calc non Af Amer: 117 mL/min/{1.73_m2} (ref >59)
Globulin, Total: 2.7 g/dL (ref 1.5–4.5)
Glucose: 91 mg/dL (ref 65–99)
Potassium: 3.8 mmol/L (ref 3.5–5.2)
Sodium: 138 mmol/L (ref 134–144)
Total Protein: 7.5 g/dL (ref 6.0–8.5)

## 2020-02-12 DIAGNOSIS — O09529 Supervision of elderly multigravida, unspecified trimester: Secondary | ICD-10-CM | POA: Insufficient documentation

## 2020-02-12 DIAGNOSIS — D259 Leiomyoma of uterus, unspecified: Secondary | ICD-10-CM

## 2020-02-12 DIAGNOSIS — O09899 Supervision of other high risk pregnancies, unspecified trimester: Secondary | ICD-10-CM | POA: Insufficient documentation

## 2020-02-12 HISTORY — DX: Leiomyoma of uterus, unspecified: D25.9

## 2020-09-26 ENCOUNTER — Inpatient Hospital Stay (HOSPITAL_COMMUNITY): Payer: No Typology Code available for payment source

## 2020-09-26 ENCOUNTER — Other Ambulatory Visit: Payer: Self-pay

## 2020-09-26 ENCOUNTER — Inpatient Hospital Stay (HOSPITAL_COMMUNITY)
Admission: AD | Admit: 2020-09-26 | Discharge: 2020-09-26 | Disposition: A | Discharge status: Home / Self Care | Payer: No Typology Code available for payment source | Attending: Obstetrics and Gynecology | Admitting: Obstetrics and Gynecology

## 2020-09-26 ENCOUNTER — Encounter (HOSPITAL_COMMUNITY): Payer: Self-pay

## 2020-09-26 DIAGNOSIS — Z3A01 Less than 8 weeks gestation of pregnancy: Secondary | ICD-10-CM

## 2020-09-26 DIAGNOSIS — D259 Leiomyoma of uterus, unspecified: Secondary | ICD-10-CM | POA: Insufficient documentation

## 2020-09-26 DIAGNOSIS — O26891 Other specified pregnancy related conditions, first trimester: Secondary | ICD-10-CM

## 2020-09-26 DIAGNOSIS — O24111 Pre-existing diabetes mellitus, type 2, in pregnancy, first trimester: Secondary | ICD-10-CM | POA: Insufficient documentation

## 2020-09-26 DIAGNOSIS — Z87442 Personal history of urinary calculi: Secondary | ICD-10-CM | POA: Insufficient documentation

## 2020-09-26 DIAGNOSIS — Z833 Family history of diabetes mellitus: Secondary | ICD-10-CM | POA: Insufficient documentation

## 2020-09-26 DIAGNOSIS — R109 Unspecified abdominal pain: Secondary | ICD-10-CM

## 2020-09-26 DIAGNOSIS — O3411 Maternal care for benign tumor of corpus uteri, first trimester: Secondary | ICD-10-CM

## 2020-09-26 LAB — WET PREP, GENITAL
Clue Cells Wet Prep HPF POC: NONE SEEN
Sperm: NONE SEEN
Trich, Wet Prep: NONE SEEN
WBC, Wet Prep HPF POC: NONE SEEN
Yeast Wet Prep HPF POC: NONE SEEN

## 2020-09-26 LAB — CBC
HCT: 36 % (ref 36.0–46.0)
Hemoglobin: 12.1 g/dL (ref 12.0–15.0)
MCH: 31.4 pg (ref 26.0–34.0)
MCHC: 33.6 g/dL (ref 30.0–36.0)
MCV: 93.5 fL (ref 80.0–100.0)
Platelets: 217 10*3/uL (ref 150–400)
RBC: 3.85 MIL/uL — ABNORMAL LOW (ref 3.87–5.11)
RDW: 11.9 % (ref 11.5–15.5)
WBC: 10 10*3/uL (ref 4.0–10.5)
nRBC: 0 % (ref 0.0–0.2)

## 2020-09-26 LAB — URINALYSIS, ROUTINE W REFLEX MICROSCOPIC
Bacteria, UA: NONE SEEN
Bilirubin Urine: NEGATIVE
Glucose, UA: NEGATIVE mg/dL
Ketones, ur: NEGATIVE mg/dL
Leukocytes,Ua: NEGATIVE
Nitrite: NEGATIVE
Protein, ur: NEGATIVE mg/dL
Specific Gravity, Urine: 1.016 (ref 1.005–1.030)
pH: 7 (ref 5.0–8.0)

## 2020-09-26 LAB — I-STAT BETA HCG BLOOD, ED (MC, WL, AP ONLY): I-stat hCG, quantitative: >2000 m[IU]/mL — ABNORMAL HIGH (ref <5)

## 2020-09-26 LAB — HCG, QUANTITATIVE, PREGNANCY: hCG, Beta Chain, Quant, S: 128699 m[IU]/mL — ABNORMAL HIGH (ref <5)

## 2020-09-26 NOTE — MAU Provider Note (Signed)
History     CSN: [id number]  Arrival date and time: 09/26/20 1027  Seen by provider at 1345    Chief Complaint  Patient presents with   Abdominal Pain   HPI Vanessa Henson Having lower abdominal pain and back pain that started today and is 8/10.  Was sent from the ER for further evaluation in MAU.  Thinks she is [redacted] weeks pregnant.    OB History     Gravida  5   Para  3   Term  2   Preterm  1   AB  1   Living  3      SAB  1   IAB      Ectopic      Multiple      Live Births  3           Past Medical History:  Diagnosis Date   DM type 2 (diabetes mellitus, type 2) ([place]) 12/14/2015   Resolved in 2021 with 62 lb weight loss.   Gallstones 2014   Biliary sludge of pregnancy   Gestational diabetes 2010   Prediabetes 2017   Renal disorder 2008   kidney stones    Past Surgical History:  Procedure Laterality Date   CESAREAN SECTION  2008   EYE SURGERY Right 2012   ?removal of [place]alazion--[place]    Family History  Problem Relation Age of Onset   Diabetes Mother    Diabetes Father    Hypertension Father    Heart disease Father        MI   Eczema Daughter    Asthma Daughter     Social History   Tobacco Use   Smoking status: Never   Smokeless tobacco: Never  Vaping Use   Vaping Use: Never used  Substance Use Topics   Alcohol use: No   Drug use: No    Allergies:  Allergies  Allergen Reactions   Diphenhydramine Hcl Other (See Comments)    Dizziness and shortness of breath.    Medications Prior to Admission  Medication Sig Dispense Refill Last Dose   fluticasone (FLONASE) 50 MCG/ACT nasal spray Place 2 sprays into both nostrils daily. 16 g 6    ibuprofen (ADVIL,MOTRIN) 200 MG tablet Take 200 mg by mouth every 6 (six) hours as needed.        Review of Systems  Constitutional:  Negative for fever.  Respiratory:  Negative for cough, shortness of breath and wheezing.   Gastrointestinal:  Positive for abdominal pain.   Genitourinary:  Negative for dysuria, vaginal bleeding and vaginal discharge.  Musculoskeletal:  Positive for back pain.  Physical Exam   Blood pressure 104/60, pulse 75, temperature 98.6 F (37 C), temperature source Oral, resp. rate 18, height '5\' 1"'$  (1.549 m), weight 61.2 kg, last menstrual period 08/01/2020, SpO2 100 %.  Physical Exam Vitals and nursing note reviewed.  Constitutional:      Appearance: She is well-developed.  HENT:     Head: Normocephalic.  Cardiovascular:     Rate and Rhythm: Regular rhythm.  Pulmonary:     Effort: Pulmonary effort is normal.  Abdominal:     Palpations: Abdomen is soft.     Tenderness: There is no abdominal tenderness. There is no guarding or rebound.     Comments: Abdominal pain in low midline.  Musculoskeletal:        General: Normal range of motion.     Cervical back: Neck supple.     Comments:  Back pain in low midline.  No CVA tenderness.  Skin:    General: Skin is warm and dry.  Neurological:     Mental Status: She is alert and oriented to person, place, and time.  Psychiatric:        Mood and Affect: Mood normal.        Behavior: Behavior normal.        Thought Content: Thought content normal.    MAU Course  Procedures LABS Results for orders placed or performed during the hospital encounter of 09/26/20 (from the past 24 hour(s))  I-Stat Beta hCG blood, ED ([facility], [facility], [facility] only)     Status: Abnormal   Collection Time: 09/26/20 11:42 AM  Result Value Ref Range   I-stat hCG, quantitative >2,000.0 (H) <5 mIU/mL   Comment 3          CBC     Status: Abnormal   Collection Time: 09/26/20 12:13 PM  Result Value Ref Range   WBC 10.0 4.0 - 10.5 K/uL   RBC 3.85 (L) 3.87 - 5.11 MIL/uL   Hemoglobin 12.1 12.0 - 15.0 g/dL   HCT 36.0 36.0 - 46.0 %   MCV 93.5 80.0 - 100.0 fL   MCH 31.4 26.0 - 34.0 pg   MCHC 33.6 30.0 - 36.0 g/dL   RDW 11.9 11.5 - 15.5 %   Platelets 217 150 - 400 K/uL   nRBC 0.0 0.0 - 0.2 %  hCG, quantitative, pregnancy      Status: Abnormal   Collection Time: 09/26/20 12:13 PM  Result Value Ref Range   hCG, Beta Chain, Quant, S 128,699 (H) <5 mIU/mL  Urinalysis, Routine w reflex microscopic PATH Cytology Cervicovaginal Ancillary Only     Status: Abnormal   Collection Time: 09/26/20 12:52 PM  Result Value Ref Range   Color, Urine YELLOW YELLOW   APPearance CLEAR CLEAR   Specific Gravity, Urine 1.016 1.005 - 1.030   pH 7.0 5.0 - 8.0   Glucose, UA NEGATIVE NEGATIVE mg/dL   Hgb urine dipstick SMALL (A) NEGATIVE   Bilirubin Urine NEGATIVE NEGATIVE   Ketones, ur NEGATIVE NEGATIVE mg/dL   Protein, ur NEGATIVE NEGATIVE mg/dL   Nitrite NEGATIVE NEGATIVE   Leukocytes,Ua NEGATIVE NEGATIVE   RBC / HPF 0-5 0 - 5 RBC/hpf   WBC, UA 0-5 0 - 5 WBC/hpf   Bacteria, UA NONE SEEN NONE SEEN   Squamous Epithelial / LPF 0-5 0 - 5   Mucus PRESENT   Wet prep, genital     Status: None   Collection Time: 09/26/20  1:22 PM   Specimen: PATH Cytology Cervicovaginal Ancillary Only  Result Value Ref Range   Yeast Wet Prep HPF POC NONE SEEN NONE SEEN   Trich, Wet Prep NONE SEEN NONE SEEN   Clue Cells Wet Prep HPF POC NONE SEEN NONE SEEN   WBC, Wet Prep HPF POC NONE SEEN NONE SEEN   Sperm NONE SEEN    CLINICAL DATA:  Abdominal pain   EXAM: OBSTETRIC <14 WK ULTRASOUND   TECHNIQUE: Transabdominal ultrasound was performed for evaluation of the gestation as well as the maternal uterus and adnexal regions.   COMPARISON:  None.   FINDINGS: Intrauterine gestational sac: Single   Yolk sac:  Visualized.   Embryo:  Visualized.   Cardiac Activity: Visualized.   Heart Rate: 147 bpm   CRL:   12.2 mm   7 w 3 d  [place] EDC: 05/12/2021   Subchorionic hemorrhage:  None visualized.   Maternal uterus/adnexae:   Right ovary: Normal   Left ovary: Normal containing corpus luteum   Other :Fibroid within the right side of uterus measures 2.9 x 2.2 x 2.0 cm   Free fluid:  None   IMPRESSION: 1. Single living  intrauterine gestation has an estimated gestational age of [redacted] weeks and 3 days. No complicating features identified. 2. Uterine fibroid.    MDM Saw patient initially in the family support room with interpreter present.  Did brief exam there.  Will get ultrasound. Reviewed [place] results with patient and in person interpreter present.  She has an appointment at [facility] on 10-10-20 to begin prenatal care.  Assessment and Plan  IUP at 81w3dUterine fibroid likely causing abdominal pain  Plan Begin prenatal care as soon as possible Take Tylenol 325 mg 2 tablets by mouth every 4 hours if needed for pain. Drink at least 8 8-oz glasses of water every day. No questions voiced. Ultrasound photo given. Advised to sign up for MyChart and instructions shown to patient on her AVS.   Cherish Runde L Mattalynn Crandle 09/26/2020, 1:49 PM

## 2020-09-26 NOTE — ED Triage Notes (Signed)
Pt about [redacted] weeks pregnant, reports 2 weeks of lower abd pain that radiates to her back with some nausea. Pt had 1 episode of vaginal bleeding 3 days ago but denies any bleeding at this time.

## 2020-09-26 NOTE — ED Provider Notes (Signed)
Emergency Medicine Provider OB Triage Evaluation Note  Vanessa Henson is a 35 y.o. female, [phone], at Unknown gestation who presents to the emergency department with complaints of lower abd pain for 2 weeks worse today.  Has had some vaginal bleeding none currently but states the last bleeding was 2 days ago BRB PV.  Review of  Systems  Positive: Lower abdominal pain Negative: Fever  Physical Exam  BP 105/62 (BP Location: Right Arm)   Pulse 62   Temp 99 F (37.2 C) (Oral)   Resp 17   SpO2 100%  General: Awake, no distress  HEENT: Atraumatic  Resp: Normal effort  Cardiac: Normal rate Abd: Nondistended, mild tenderness to palpation across the lower abdomen MSK: Moves all extremities without difficulty Neuro: Speech clear  Medical Decision Making  Pt evaluated for pregnancy concern and is stable for transfer to MAU. Pt is in agreement with plan for transfer.  11:43 AM Discussed with MAU APP, Threasa Beards, who accepts patient in transfer.  Clinical Impression  No diagnosis found.     Pati Gallo Tab, [place] 09/26/20 1144    Blanchie Dessert, MD 09/26/20 1440

## 2020-09-26 NOTE — MAU Note (Signed)
For the past 2 wks has back pain and abd pain. Not getting worse, is constant, cramping.  Sent up from ER, knew she was preg.  Little spotting 2 days ago, none since

## 2020-09-26 NOTE — Discharge Instructions (Signed)
Las medicinas seguras para tomar Solicitor  Safe Medications in Pregnancy  Acn:  Benzoyl Peroxide (Perxido de benzolo)  Salicylic Acid (cido saliclico)  Dolor de espalda/Dolor de cabeza:  Tylenol: 2 pastillas de concentracin regular cada 4 horas O 2 pastillas de concentracin fuerte cada 6 horas  Resfriados/Tos/Alergias:  Benadryl (sin alcohol) 25 mg cada 6 horas segn lo necesite Breath Right strips (Tiras para respirar correctamente)  Claritin  Cepacol (pastillas de chupar para la garganta)  Chloraseptic (aerosol para la garganta)  Cold-Eeze- hasta tres veces por da  Cough drops (pastillas de chupar para la tos, sin alcohol)  Flonase  Guaifenesin  Mucinex  Robitussin DM (simple solamente, sin alcohol)  Saline nasal spray/drops (Aerosol nasal salino/gotas) Sudafed (pseudoephedrine) y  Actifed * utilizar slo despus de 12 semanas de gestacin y si no tiene la presin arterial alta.  Tylenol Vicks  VapoRub  Zinc lozenges (pastillas para la garganta)  Zyrtec  Estreimiento:  Colace  Ducolax (supositorios)  Fleet enema (lavado intestinal rectal)  Glycerin (supositorios)  Metamucil  Milk of magnesia (leche de magnesia)  Miralax  Senokot  Smooth Move (t)  Diarrea:  Kaopectate Imodium A-D  *NO tome Pepto-Bismol  Hemorroides:  Anusol  Anusol HC  Preparation H  Tucks  Indigestin:  Tums  Maalox  Mylanta  Zantac  Pepcid  Insomnia:  Benadryl (sin alcohol) '25mg'$  cada 6 horas segn lo necesite  Tylenol PM  Unisom, no Gelcaps  Calambres en las piernas:  Tums  MagGel Nuseas/Vmitos:  Bonine  Dramamine  Emetrol  Ginger (extracto)  Sea-Bands  Meclizine  Medicina para las nuseas que puede tomar durante el embarazo: Unisom (doxylamine succinate, pastillas de 25 mg) Tome una pastilla al da al [place]. Si los sntomas no estn adecuadamente controlados, la dosis puede aumentarse hasta una dosis mxima recomendada de [employer] al da (1/2  pastilla por la Standard, 1/2 pastilla a media tarde y Ardelia Mems pastilla al [place]). Pastillas de Vitamina B6 de '100mg'$ . Tome [employer] veces al da (hasta 200 mg por da).  Erupciones en la piel:  Productos de Aveeno  Benadryl cream (crema o una dosis de '25mg'$  cada 6 horas segn lo necesite)  Calamine Lotion (locin)  1% cortisone cream (crema de cortisona de 1%)  nfeccin vaginal por hongos (candidiasis):  Gyne-lotrimin 7  Monistat 7   **Si est tomando varias medicinas, por favor revise las etiquetas para Conservation officer, nature los mismos ingredientes [place]. **Tome la medicina segn lo indicado en la etiqueta. **No tome ms de 400 mg de Tylenol en 24 horas. **No tome medicinas que contengan aspirina o ibuprofeno.

## 2020-09-27 LAB — GC/CHLAMYDIA PROBE AMP (~~LOC~~) NOT AT ARMC
Chlamydia: NEGATIVE
Comment: NEGATIVE
Comment: NORMAL
Neisseria Gonorrhea: NEGATIVE

## 2020-10-05 ENCOUNTER — Encounter: Payer: Self-pay | Admitting: *Deleted

## 2020-10-10 ENCOUNTER — Telehealth (INDEPENDENT_AMBULATORY_CARE_PROVIDER_SITE_OTHER): Payer: Self-pay | Admitting: *Deleted

## 2020-10-10 ENCOUNTER — Telehealth: Payer: Self-pay | Admitting: Clinical

## 2020-10-10 ENCOUNTER — Other Ambulatory Visit: Payer: Self-pay

## 2020-10-10 DIAGNOSIS — F32A Depression, unspecified: Secondary | ICD-10-CM

## 2020-10-10 DIAGNOSIS — Z3A00 Weeks of gestation of pregnancy not specified: Secondary | ICD-10-CM

## 2020-10-10 DIAGNOSIS — F418 Other specified anxiety disorders: Secondary | ICD-10-CM

## 2020-10-10 DIAGNOSIS — F419 Anxiety disorder, unspecified: Secondary | ICD-10-CM

## 2020-10-10 DIAGNOSIS — O219 Vomiting of pregnancy, unspecified: Secondary | ICD-10-CM

## 2020-10-10 DIAGNOSIS — K117 Disturbances of salivary secretion: Secondary | ICD-10-CM

## 2020-10-10 DIAGNOSIS — O3410 Maternal care for benign tumor of corpus uteri, unspecified trimester: Secondary | ICD-10-CM

## 2020-10-10 DIAGNOSIS — O09899 Supervision of other high risk pregnancies, unspecified trimester: Secondary | ICD-10-CM

## 2020-10-10 DIAGNOSIS — Z603 Acculturation difficulty: Secondary | ICD-10-CM

## 2020-10-10 DIAGNOSIS — O99619 Diseases of the digestive system complicating pregnancy, unspecified trimester: Secondary | ICD-10-CM

## 2020-10-10 DIAGNOSIS — O09529 Supervision of elderly multigravida, unspecified trimester: Secondary | ICD-10-CM

## 2020-10-10 DIAGNOSIS — O99340 Other mental disorders complicating pregnancy, unspecified trimester: Secondary | ICD-10-CM

## 2020-10-10 DIAGNOSIS — O0990 Supervision of high risk pregnancy, unspecified, unspecified trimester: Secondary | ICD-10-CM

## 2020-10-10 DIAGNOSIS — D259 Leiomyoma of uterus, unspecified: Secondary | ICD-10-CM

## 2020-10-10 MED ORDER — PROMETHAZINE HCL 25 MG PO TABS
25.0000 mg | ORAL_TABLET | Freq: Four times a day (QID) | ORAL | 0 refills | Status: DC | PRN
Start: 2020-10-10 — End: 2021-01-15

## 2020-10-10 MED ORDER — GLYCOPYRROLATE 1 MG PO TABS: 1.0000 mg | ORAL_TABLET | Freq: Two times a day (BID) | ORAL | 0 refills | Status: DC

## 2020-10-10 NOTE — Patient Instructions (Signed)
  At our [facility] OB/GYN Practices, we work as an integrated team, providing care to address both physical and emotional health. Your medical provider may refer you to see our [facility]) on the same day you see your medical provider, as availability permits; often scheduled virtually at your convenience.  Our [facility] is available to all patients, visits generally last between 20-30 minutes, but can be longer or shorter, depending on patient need. The [facility] offers help with stress management, coping with symptoms of depression and anxiety, major life changes , sleep issues, changing risky behavior, grief and loss, life stress, working on personal life goals, and  behavioral health issues, as these all affect your overall health and wellness.  The [facility] is NOT available for the following: FMLA paperwork, court-ordered evaluations, specialty assessments (custody or disability), letters to employers, or obtaining certification for an emotional support animal. The [facility] does not provide long-term therapy. You have the right to refuse integrated behavioral health services, or to reschedule to see the [facility] at a later date.  Confidentiality exception: If it is suspected that a child or disabled adult is being abused or neglected, we are required by law to report that to either Child Protective Services or Adult Scientist, forensic.  If you have a diagnosis of Bipolar affective disorder, Schizophrenia, or recurrent Major depressive disorder, we will recommend that you establish care with a psychiatrist, as these are lifelong, chronic conditions, and we want your overall emotional health and medications to be more closely monitored. If you anticipate needing extended maternity leave due to mental health issues postpartum, it it recommended you inform your medical provider, so we can put in a referral to a psychiatrist as soon as possible. The [facility] is unable to recommend an extended maternity leave for mental  health issues. Your medical provider or [facility] may refer you to a therapist for ongoing, traditional therapy, or to a psychiatrist, for medication management, if it would benefit your overall health. Depending on your insurance, you may have a copay or be charged a deductible, depending on your insurance, to see the [facility]. If you are uninsured, it is recommended that you apply for financial assistance. (Forms may be requested at the front desk for in-person visits, via MyChart, or request a form during a virtual visit).  If you see the [facility] more than 6 times, you will have to complete a comprehensive clinical assessment interview with the [facility] to resume integrated services.  For virtual visits with the [facility] Services, you must be physically in the state of [place] at the time of the visit. For example, if you live in [place], you will have to do an in-person visit with the [facility], and your out-of-state insurance may not cover behavioral health services in [place]. If you are going out of the state or country for any reason, the [facility] may see you virtually when you return to [place], but not while you are physically outside of [place].    [email or web address] is a Microbiologist for delivery /hospital information  Here is a link to the Pregnancy Navigators . Please Fill out prior to your New OB appointment.   English Link: [email or web address]  Spanish Link: [email or web address]

## 2020-10-10 NOTE — Progress Notes (Signed)
8:22 called Vanessa Henson to ask if she is ready to begin virtual visit. Link sent for virtual visit.  Vanessa Hara,RN  New OB Intake  I connected with  Vanessa Henson on 10/10/20 at 08:35 by MyChart Video Visit and verified that I am speaking with the correct person using two identifiers. Nurse is located at [facility] and pt is located at home.  I discussed the limitations, risks, security and privacy concerns of performing an evaluation and management service by telephone and the availability of in person appointments. I also discussed with the patient that there may be a patient responsible charge related to this service. The patient expressed understanding and agreed to proceed.  I explained I am completing New OB Intake today. We discussed her EDD of 05/08/21 that is based on LMP of 08/01/20. Pt is G5/P2113. I reviewed her allergies, medications, Medical/Surgical/OB history, and appropriate screenings. I informed her of [facility] services. Based on history, this is a/an  pregnancy complicated by AMA, HX PTD, HX GDM, HX DM2, fibroids   .  During visit Vanessa Henson spiting often.  She reports she has nausea and excessive spitting all the time. States this makes her feel like she can't do anything or go anywhere. States she has thought about having abortion to end the pregnancy. Offered Phenergan per protocol for nausea. Consulted with Vanessa Henson re: medication for saliva. Vanessa Henson.   Patient also + PHQ9 and GAd 7 . Offered [facility] appt for 9/2 which patient accepted.  Patient did admit to thoughts one day while driving of driving car into something because she felt so bad. Asked Vanessa Henson to come in and talk with patient which she did.   Patient Active Problem List   Diagnosis Date Noted   Supervision of high risk pregnancy, antepartum 10/10/2020   Fibroid, uterine 2022   AMA (advanced maternal age) multigravida 35+ 2022   History of preterm delivery, currently pregnant 2022   Elevated LDL cholesterol  level 09/07/2019   Seasonal allergies 11/18/2018   Obesity 03/29/2016   DM type 2 (diabetes mellitus, type 2) ([place]) 12/14/2015   Language barrier, cultural differences 05/08/2012   History of gestational diabetes 03/12/2012   Renal disorder 2008    Concerns addressed today  Delivery Plans:  Plans to deliver at [facility].   MyChart/Babyscripts  I explained pt will have some visits in office and some virtually.   Blood Pressure Cuff  Patient is self-pay; explained patient will be given BP cuff at first prenatal appt. Explained after first prenatal appt pt will check weekly and document in 53.  Weight scale: Patient    have weight scale. Weight scale Henson for patient to pick up form [facility].   Anatomy US Explained first scheduled [place] will be around 19 weeks. Patient will be notified at ob appoitment.   Labs Routine prenatal labs needed at new ob visit, will offer genetic testing at new ob visit.   Covid Vaccine Patient has not covid vaccine.   Mother/ Baby Dyad Candidate?   No If yes, offer as possibility  Informed patient of [facility] website  and placed link in her AVS.   Social Determinants of Health Food Insecurity: Patient denies food insecurity. WIC Referral: Patient is interested in referral to [facility].  Transportation: Patient denies transportation needs. Childcare: Discussed no children allowed at ultrasound appointments. Offered childcare services; patient declines childcare services at this time.  Send link to Pregnancy Navigators   Placed OB Box on  problem list and updated  First visit review I scheduled a new ob appointment with patient.  I explained she will have a pelvic exam, ob bloodwork with genetic screening if desired , and PAP smear. Explained pt will be seen by Dr. Kennon Rounds  at first visit; encounter routed to appropriate provider. [facility] information placed in AVS.   [name],RN 10/10/2020  10:08 AM

## 2020-10-10 NOTE — BH Specialist Note (Deleted)
Integrated Behavioral Health Initial In-Person Visit  MRN: [id number] Name: Vanessa Henson  Number of [place] Clinician visits:: 1/6 Session Start time: 1:15***  Session End time: 2:15*** Total time: {IBH Total Time:21014050} minutes  Types of Service: {CHL AMB TYPE OF SERVICE:[phone]}  Interpretor:{yes [id number] Interpretor Name and Language: ***   Warm Hand Off Completed.        Subjective: Vanessa Henson is a 35 y.o. female accompanied by {CHL AMB ACCOMPANIED [id number] Patient was referred by *** for ***. Patient reports the following symptoms/concerns: *** Duration of problem: ***; Severity of problem: {Mild/Moderate/Severe:20260}  Objective: Mood: {BHH MOOD:22306} and Affect: {BHH AFFECT:22307} Risk of harm to self or others: {CHL AMB BH Suicide Current Mental Status:21022748}  Life Context: Family and Social: *** School/Work: *** Self-Care: *** Life Changes: ***  Patient and/or Family's Strengths/Protective Factors: {CHL AMB BH PROTECTIVE FACTORS:[phone]}  Goals Addressed: Patient will: Reduce symptoms of: {IBH Symptoms:21014056} Increase knowledge and/or ability of: {IBH Patient Tools:21014057}  Demonstrate ability to: {IBH Goals:21014053}  Progress towards Goals: {CHL AMB BH PROGRESS TOWARDS [phone]}  Interventions: Interventions utilized: {IBH Interventions:21014054}  Standardized Assessments completed: {IBH Screening Tools:21014051}  Patient and/or Family Response: ***  Patient Centered Plan: Patient is on the following Treatment Plan(s):  ***  Assessment: Patient currently experiencing ***.   Patient may benefit from ***.  Plan: Follow up with behavioral health clinician on : *** Behavioral recommendations: *** Referral(s): {IBH Referrals:21014055} "From scale of 1-10, how likely are you to follow plan?": ***  Garlan Fair, LCSW  Depression screen [facility] 2/9 10/10/2020 12/14/2015  09/15/2015 05/13/2015  Decreased Interest 3 0 0 0  Down, Depressed, Hopeless 3 0 0 0  PHQ - 2 Score 6 0 0 0  Altered sleeping 3 0 0 -  Tired, decreased energy 3 1 0 -  Change in appetite 3 0 0 -  Feeling bad or failure about yourself  3 0 0 -  Trouble concentrating 0 0 2 -  Moving slowly or fidgety/restless 0 0 0 -  Suicidal thoughts 1 0 0 -  PHQ-9 Score '19 1 2 '$ -  Difficult doing work/chores - - Not difficult at all -   GAD 7 : Generalized Anxiety Score 10/10/2020  Nervous, Anxious, on Edge 3  Control/stop worrying 3  Worry too much - different things 3  Trouble relaxing 1  Restless 1  Easily annoyed or irritable 2  Afraid - awful might happen 0  Total GAD 7 Score 13

## 2020-10-10 NOTE — Telephone Encounter (Signed)
Attempt to follow up with pt, after brief encounter earlier in day; Left HIPPA-compliant message to call back Roselyn Reef from [employer] for [employer] at [facility] at  [phone] [facility] office) via [place], [place].

## 2020-10-12 ENCOUNTER — Institutional Professional Consult (permissible substitution): Payer: Self-pay

## 2020-10-25 ENCOUNTER — Other Ambulatory Visit: Payer: Self-pay

## 2020-10-25 ENCOUNTER — Ambulatory Visit (INDEPENDENT_AMBULATORY_CARE_PROVIDER_SITE_OTHER): Payer: Self-pay | Admitting: Family Medicine

## 2020-10-25 VITALS — BP 117/79 | HR 78 | Wt 141.2 lb

## 2020-10-25 DIAGNOSIS — Z603 Acculturation difficulty: Secondary | ICD-10-CM

## 2020-10-25 DIAGNOSIS — O219 Vomiting of pregnancy, unspecified: Secondary | ICD-10-CM

## 2020-10-25 DIAGNOSIS — O09529 Supervision of elderly multigravida, unspecified trimester: Secondary | ICD-10-CM

## 2020-10-25 DIAGNOSIS — O34219 Maternal care for unspecified type scar from previous cesarean delivery: Secondary | ICD-10-CM

## 2020-10-25 DIAGNOSIS — K117 Disturbances of salivary secretion: Secondary | ICD-10-CM

## 2020-10-25 DIAGNOSIS — E119 Type 2 diabetes mellitus without complications: Secondary | ICD-10-CM

## 2020-10-25 DIAGNOSIS — O0990 Supervision of high risk pregnancy, unspecified, unspecified trimester: Secondary | ICD-10-CM

## 2020-10-25 DIAGNOSIS — D259 Leiomyoma of uterus, unspecified: Secondary | ICD-10-CM

## 2020-10-25 DIAGNOSIS — O09899 Supervision of other high risk pregnancies, unspecified trimester: Secondary | ICD-10-CM

## 2020-10-25 MED ORDER — ASPIRIN EC 81 MG PO TBEC: 81.0000 mg | DELAYED_RELEASE_TABLET | Freq: Every day | ORAL | 11 refills | Status: DC

## 2020-10-25 MED ORDER — GLYCOPYRROLATE 2 MG PO TABS: 2.0000 mg | ORAL_TABLET | Freq: Three times a day (TID) | ORAL | 2 refills | Status: DC

## 2020-10-25 NOTE — Progress Notes (Signed)
Subjective:   Vanessa Henson is a 35 y.o. [phone] at 23w1dby LMP, early ultrasound being seen today for her first obstetrical visit.  Her obstetrical history is significant for  h/o Preterm birth, previous C-section with VBAC x 2, T2DM and h/o GDM . Patient does intend to breast feed. Pregnancy history fully reviewed.  Patient reports  low abdominal pain .  HISTORY: OB History  Gravida Para Term Preterm AB Living  '5 3 2 1 1 3  '$ SAB IAB Ectopic Multiple Live Births  1 0 0 0 3    # Outcome Date GA Lbr Len/2nd Weight Sex Delivery Anes PTL Lv  5 Current           4 Preterm 07/07/12 379w2d2:32 / 00:35 5 lb 7.8 oz (2.489 kg) F VBAC EPI  LIV     Birth Comments: PPROM     Name: SANCHEZ VICENTE,GIRL Ilhan     Apgar1: 6  Apgar5: 8  3 Term 2010 4048w0d lb (3.175 kg) F VBAC EPI  LIV     Birth Comments: VBAC, GDM  2 Term 2008 40w42w0dlb (3.175 kg) F CS-LTranv   LIV     Birth Comments: wnl except c/s for ftp  1 SAB 2006 12w041w0d  DEC   Last pap smear was  11/2018 and was normal Past Medical History:  Diagnosis Date   DM type 2 (diabetes mellitus, type 2) (HCC) [place]   Resolved in 2021 with 62 lb weight loss.   Fibroid, uterine 2022   Gallstones 2014   Biliary sludge of pregnancy   Gestational diabetes 2010   Prediabetes 2017   Renal disorder 2008   kidney stones   Past Surgical History:  Procedure Laterality Date   CESAREAN SECTION  2008   EYE SURGERY Right 2012   ?removal of chalazion--[place]   Family History  Problem Relation Age of Onset   Diabetes Mother    Diabetes Father    Hypertension Father    Heart disease Father        MI   Eczema Daughter    Asthma Daughter    Social History   Tobacco Use   Smoking status: Never   Smokeless tobacco: Never  Vaping Use   Vaping Use: Never used  Substance Use Topics   Alcohol use: No   Drug use: No   Allergies  Allergen Reactions   Diphenhydramine Hcl Other (See Comments)    Dizziness and  shortness of breath.   Current Outpatient Medications on File Prior to Visit  Medication Sig Dispense Refill   fluticasone (FLONASE) 50 MCG/ACT nasal spray Place 2 sprays into both nostrils daily. 16 g 6   promethazine (PHENERGAN) 25 MG tablet Take 1 tablet (25 mg total) by mouth every 6 (six) hours as needed for nausea or vomiting. 30 tablet 0   No current facility-administered medications on file prior to visit.     Exam   Vitals:   10/25/20 1612  BP: 117/79  Pulse: 78  Weight: 141 lb 3.2 oz (64 kg)   Fetal Heart Rate (bpm): 152  System: General: well-developed, well-nourished female in no acute distress   Skin: normal coloration and turgor, no rashes   Neurologic: oriented, normal, negative, normal mood   Extremities: normal strength, tone, and muscle mass, ROM of all joints is normal   HEENT PERRLA, extraocular movement intact and sclera clear, anicteric   Mouth/Teeth  mucous membranes moist, pharynx normal without lesions and dental hygiene good   Neck supple and no masses   Cardiovascular: regular rate and rhythm   Respiratory:  no respiratory distress, normal breath sounds   Abdomen: soft, non-tender; bowel sounds normal; no masses,  no organomegaly     Assessment:   Pregnancy: [id number] Patient Active Problem List   Diagnosis Date Noted   Supervision of high risk pregnancy, antepartum 10/10/2020   Fibroid, uterine 2022   AMA (advanced maternal age) multigravida 35+ 2022   History of preterm delivery, currently pregnant 2022   Elevated LDL cholesterol level 09/07/2019   Seasonal allergies 11/18/2018   Obesity 03/29/2016   DM type 2 (diabetes mellitus, type 2) ([place]) 12/14/2015   Language barrier, cultural differences 05/08/2012   History of gestational diabetes 03/12/2012   Previous cesarean delivery affecting pregnancy, antepartum 03/12/2012   Renal disorder 2008     Plan:  1. Supervision of high risk pregnancy, antepartum New OB labs - GC/Chlamydia probe  amp ([place])not at [place], OB Urine - CBC/D/Plt+RPR+Rh+ABO+RubIgG...  2. Language barrier, cultural differences Spanish interpreter: [name] used   3. History of preterm delivery, currently pregnant Reviewed and discussed 17 P. After careful consideration and using shared decision making, patient declines.  4. Type 2 diabetes mellitus without complication, without long-term current use of insulin ([place]) Not on diet. Last A1C was WNL following 62 lb weight loss. Start with A1C, if borderline, check 2 hour. If abnormal, treat presumptively. If normal, 28 wk labs. Baseline labs Begin ASA - Hemoglobin A1c - aspirin EC 81 MG tablet; Take 1 tablet (81 mg total) by mouth daily. Swallow whole.  Dispense: 30 tablet; Refill: 11 - Protein / creatinine ratio, urine - TSH - Comprehensive metabolic panel  5. Uterine leiomyoma, unspecified location Not tender here  6. Previous cesarean delivery affecting pregnancy, antepartum Desires RCS with BTL (just to get BTL done)  7. Nausea and vomiting in pregnancy Still with ptyalism Increase [name] to 2 mg tid. - glycopyrrolate ([name]-FORTE) 2 MG tablet; Take 1 tablet (2 mg total) by mouth 3 (three) times daily.  Dispense: 270 tablet; Refill: 2  8. Ptyalism See above   Initial labs drawn. Continue prenatal vitamins. Genetic Screening discussed, NIPS: ordered. Ultrasound discussed; fetal anatomic survey: ordered. Problem list reviewed and updated. The nature of [place] with multiple MDs and other Advanced Practice Providers was explained to patient; also emphasized that residents, students are part of our team. Routine obstetric precautions reviewed. Return in about 4 weeks (around 11/22/2020) for [facility].

## 2020-10-25 NOTE — Addendum Note (Signed)
Addended byMariane Baumgarten on: 10/25/2020 05:16 PM   Modules accepted: Orders

## 2020-10-26 LAB — CBC/D/PLT+RPR+RH+ABO+RUBIGG...
Antibody Screen: NEGATIVE
Basophils Absolute: 0.1 10*3/uL (ref 0.0–0.2)
Basos: 1 %
EOS (ABSOLUTE): 0.1 10*3/uL (ref 0.0–0.4)
Eos: 1 %
HCV Ab: <0.1 s/co ratio (ref 0.0–0.9)
HIV Screen 4th Generation wRfx: NONREACTIVE
Hematocrit: 36.6 % (ref 34.0–46.6)
Hemoglobin: 12.7 g/dL (ref 11.1–15.9)
Hepatitis B Surface Ag: NEGATIVE
Immature Grans (Abs): 0.1 10*3/uL (ref 0.0–0.1)
Immature Granulocytes: 1 %
Lymphocytes Absolute: 2.5 10*3/uL (ref 0.7–3.1)
Lymphs: 19 %
MCH: 31.5 pg (ref 26.6–33.0)
MCHC: 34.7 g/dL (ref 31.5–35.7)
MCV: 91 fL (ref 79–97)
Monocytes Absolute: 0.7 10*3/uL (ref 0.1–0.9)
Monocytes: 5 %
Neutrophils Absolute: 9.6 10*3/uL — ABNORMAL HIGH (ref 1.4–7.0)
Neutrophils: 73 %
Platelets: 240 10*3/uL (ref 150–450)
RBC: 4.03 x10E6/uL (ref 3.77–5.28)
RDW: 12.4 % (ref 11.7–15.4)
RPR Ser Ql: NONREACTIVE
Rh Factor: POSITIVE
Rubella Antibodies, IGG: 1.16 index (ref >0.99)
WBC: 13 10*3/uL — ABNORMAL HIGH (ref 3.4–10.8)

## 2020-10-26 LAB — HCV INTERPRETATION

## 2020-10-26 LAB — COMPREHENSIVE METABOLIC PANEL
ALT: 14 IU/L (ref 0–32)
AST: 13 IU/L (ref 0–40)
Albumin/Globulin Ratio: 1.5 (ref 1.2–2.2)
Albumin: 4.1 g/dL (ref 3.8–4.8)
Alkaline Phosphatase: 73 IU/L (ref 44–121)
BUN/Creatinine Ratio: 25 — ABNORMAL HIGH (ref 9–23)
BUN: 14 mg/dL (ref 6–20)
Bilirubin Total: <0.2 mg/dL (ref 0.0–1.2)
CO2: 22 mmol/L (ref 20–29)
Calcium: 9.4 mg/dL (ref 8.7–10.2)
Chloride: 101 mmol/L (ref 96–106)
Creatinine, Ser: 0.55 mg/dL — ABNORMAL LOW (ref 0.57–1.00)
Globulin, Total: 2.8 g/dL (ref 1.5–4.5)
Glucose: 81 mg/dL (ref 65–99)
Potassium: 4.1 mmol/L (ref 3.5–5.2)
Sodium: 135 mmol/L (ref 134–144)
Total Protein: 6.9 g/dL (ref 6.0–8.5)
eGFR: 123 mL/min/{1.73_m2} (ref >59)

## 2020-10-26 LAB — PROTEIN / CREATININE RATIO, URINE
Creatinine, Urine: 127.9 mg/dL
Protein, Ur: 10.3 mg/dL
Protein/Creat Ratio: 81 mg/g creat (ref 0–200)

## 2020-10-26 LAB — HEMOGLOBIN A1C
Est. average glucose Bld gHb Est-mCnc: 105 mg/dL
Hgb A1c MFr Bld: 5.3 % (ref 4.8–5.6)

## 2020-10-26 LAB — TSH: TSH: 1.24 u[IU]/mL (ref 0.450–4.500)

## 2020-10-27 LAB — URINE CULTURE, OB REFLEX

## 2020-10-27 LAB — CULTURE, OB URINE

## 2020-10-31 ENCOUNTER — Telehealth: Payer: No Typology Code available for payment source

## 2020-11-02 ENCOUNTER — Encounter: Payer: Self-pay | Admitting: General Practice

## 2020-11-06 ENCOUNTER — Other Ambulatory Visit: Payer: Self-pay | Admitting: Family Medicine

## 2020-11-06 DIAGNOSIS — O219 Vomiting of pregnancy, unspecified: Secondary | ICD-10-CM

## 2020-11-06 DIAGNOSIS — O0990 Supervision of high risk pregnancy, unspecified, unspecified trimester: Secondary | ICD-10-CM

## 2020-11-06 DIAGNOSIS — K117 Disturbances of salivary secretion: Secondary | ICD-10-CM

## 2020-11-22 ENCOUNTER — Encounter: Payer: No Typology Code available for payment source | Admitting: Obstetrics and Gynecology

## 2020-11-22 ENCOUNTER — Ambulatory Visit (INDEPENDENT_AMBULATORY_CARE_PROVIDER_SITE_OTHER): Payer: Self-pay | Admitting: Obstetrics and Gynecology

## 2020-11-22 ENCOUNTER — Other Ambulatory Visit (HOSPITAL_COMMUNITY)
Admission: RE | Admit: 2020-11-22 | Discharge: 2020-11-22 | Disposition: A | Discharge status: Home / Self Care | Payer: No Typology Code available for payment source | Source: Ambulatory Visit | Attending: Obstetrics and Gynecology | Admitting: Obstetrics and Gynecology

## 2020-11-22 ENCOUNTER — Other Ambulatory Visit: Payer: Self-pay

## 2020-11-22 VITALS — BP 105/57 | HR 71 | Wt 147.0 lb

## 2020-11-22 DIAGNOSIS — Z3A16 16 weeks gestation of pregnancy: Secondary | ICD-10-CM

## 2020-11-22 DIAGNOSIS — Z789 Other specified health status: Secondary | ICD-10-CM

## 2020-11-22 DIAGNOSIS — O09899 Supervision of other high risk pregnancies, unspecified trimester: Secondary | ICD-10-CM

## 2020-11-22 DIAGNOSIS — O0990 Supervision of high risk pregnancy, unspecified, unspecified trimester: Secondary | ICD-10-CM

## 2020-11-22 DIAGNOSIS — K219 Gastro-esophageal reflux disease without esophagitis: Secondary | ICD-10-CM

## 2020-11-22 DIAGNOSIS — Z6836 Body mass index (BMI) 36.0-36.9, adult: Secondary | ICD-10-CM

## 2020-11-22 DIAGNOSIS — Z98891 History of uterine scar from previous surgery: Secondary | ICD-10-CM | POA: Insufficient documentation

## 2020-11-22 DIAGNOSIS — O09522 Supervision of elderly multigravida, second trimester: Secondary | ICD-10-CM

## 2020-11-22 MED ORDER — FAMOTIDINE 20 MG PO TABS: 20.0000 mg | ORAL_TABLET | Freq: Two times a day (BID) | ORAL | 3 refills | Status: DC

## 2020-11-22 NOTE — Progress Notes (Signed)
   PRENATAL VISIT NOTE  Subjective:  Vanessa Henson is a 35 y.o. [phone] at [redacted]w[redacted]d being seen today for ongoing prenatal care.  She is currently monitored for the following issues for this high-risk pregnancy and has History of gestational diabetes; History of VBAC; Language barrier, cultural differences; Obesity; Seasonal allergies; Elevated LDL cholesterol level; Supervision of high risk pregnancy, antepartum; Fibroid, uterine; AMA (advanced maternal age) multigravida 35+; History of preterm delivery, currently pregnant; and Language barrier on their problem list.  Patient reports no complaints.  Contractions: Not present. Vag. Bleeding: None.  Movement: Present. Denies leaking of fluid.   The following portions of the patient's history were reviewed and updated as appropriate: allergies, current medications, past family history, past medical history, past social history, past surgical history and problem list.   Objective:   Vitals:   11/22/20 1432  BP: (!) 105/57  Pulse: 71  Weight: 147 lb (66.7 kg)    Fetal Status: Fetal Heart Rate (bpm): 143   Movement: Present     General:  Alert, oriented and cooperative. Patient is in no acute distress.  Skin: Skin is warm and dry. No rash noted.   Cardiovascular: Normal heart rate noted  Respiratory: Normal respiratory effort, no problems with respiration noted  Abdomen: Soft, gravid, appropriate for gestational age.        Pelvic: Cervical exam deferred        Extremities: Normal range of motion.  Edema: None  Mental Status: Normal mood and affect. Normal behavior. Normal judgment and thought content.   Assessment and Plan:  Pregnancy: [id number] at [redacted]w[redacted]d 1. History of VBAC F/u about future visits re: delivery mode.  - AFP, Serum, Open Spina Bifida - Cervicovaginal ancillary only( [place])  2. Gastroesophageal reflux disease, unspecified whether esophagitis present Pepcid sent in to see if that can help with ptyalism better  than tums; pt also already on robinul  3. [redacted] weeks gestation of pregnancy  4. Supervision of high risk pregnancy, antepartum Has anatomy u/s on 11/1 D/w them re: labs, all normal. Gender reveal Continue asa Watch weight  5. History of preterm delivery, currently pregnant D3eclined 17p  6. Multigravida of advanced maternal age in second trimester  7. Language barrier Interpreter used  Preterm labor symptoms and general obstetric precautions including but not limited to vaginal bleeding, contractions, leaking of fluid and fetal movement were reviewed in detail with the patient. Please refer to After Visit Summary for other counseling recommendations.   Return in 20 days (on 12/12/2020) for low risk ob, md or app, in person.  Future Appointments  Date Time Provider [place]  12/08/2020  3:00 PM Mack Hook, MD [facility] None  12/12/2020  8:00 AM [facility] NURSE [facility]  12/12/2020  8:15 AM [facility] US[facility]  12/14/2020  3:15 PM Woodroe Mode, MD [facility]    Aletha Halim, MD

## 2020-11-23 LAB — CERVICOVAGINAL ANCILLARY ONLY
Chlamydia: NEGATIVE
Comment: NEGATIVE
Comment: NORMAL
Neisseria Gonorrhea: NEGATIVE

## 2020-11-24 LAB — AFP, SERUM, OPEN SPINA BIFIDA
AFP MoM: 1.03
AFP Value: 37.1 ng/mL
Gest. Age on Collection Date: 16.1 weeks
Maternal Age At EDD: 35.3 yr
OSBR Risk 1 IN: 10000
Test Results:: NEGATIVE
Weight: 148 [lb_av]

## 2020-12-08 ENCOUNTER — Encounter: Payer: Self-pay | Admitting: Internal Medicine

## 2020-12-12 ENCOUNTER — Ambulatory Visit (HOSPITAL_BASED_OUTPATIENT_CLINIC_OR_DEPARTMENT_OTHER): Payer: No Typology Code available for payment source | Admitting: Maternal & Fetal Medicine

## 2020-12-12 ENCOUNTER — Other Ambulatory Visit: Payer: Self-pay | Admitting: Maternal & Fetal Medicine

## 2020-12-12 ENCOUNTER — Ambulatory Visit: Payer: No Typology Code available for payment source | Admitting: *Deleted

## 2020-12-12 ENCOUNTER — Ambulatory Visit: Payer: Self-pay | Attending: Family Medicine

## 2020-12-12 ENCOUNTER — Other Ambulatory Visit: Payer: Self-pay

## 2020-12-12 ENCOUNTER — Other Ambulatory Visit: Payer: Self-pay | Admitting: *Deleted

## 2020-12-12 ENCOUNTER — Encounter: Payer: Self-pay | Admitting: *Deleted

## 2020-12-12 VITALS — BP 112/55 | HR 68

## 2020-12-12 DIAGNOSIS — Z8751 Personal history of pre-term labor: Secondary | ICD-10-CM

## 2020-12-12 DIAGNOSIS — O09522 Supervision of elderly multigravida, second trimester: Secondary | ICD-10-CM

## 2020-12-12 DIAGNOSIS — Z3A19 19 weeks gestation of pregnancy: Secondary | ICD-10-CM

## 2020-12-12 DIAGNOSIS — O09899 Supervision of other high risk pregnancies, unspecified trimester: Secondary | ICD-10-CM

## 2020-12-12 DIAGNOSIS — O99340 Other mental disorders complicating pregnancy, unspecified trimester: Secondary | ICD-10-CM | POA: Insufficient documentation

## 2020-12-12 DIAGNOSIS — Z3689 Encounter for other specified antenatal screening: Secondary | ICD-10-CM

## 2020-12-12 DIAGNOSIS — Z603 Acculturation difficulty: Secondary | ICD-10-CM

## 2020-12-12 DIAGNOSIS — D259 Leiomyoma of uterus, unspecified: Secondary | ICD-10-CM

## 2020-12-12 DIAGNOSIS — O34219 Maternal care for unspecified type scar from previous cesarean delivery: Secondary | ICD-10-CM

## 2020-12-12 DIAGNOSIS — O09292 Supervision of pregnancy with other poor reproductive or obstetric history, second trimester: Secondary | ICD-10-CM

## 2020-12-12 DIAGNOSIS — O09212 Supervision of pregnancy with history of pre-term labor, second trimester: Secondary | ICD-10-CM

## 2020-12-12 DIAGNOSIS — O09529 Supervision of elderly multigravida, unspecified trimester: Secondary | ICD-10-CM

## 2020-12-12 DIAGNOSIS — O219 Vomiting of pregnancy, unspecified: Secondary | ICD-10-CM

## 2020-12-12 DIAGNOSIS — O0990 Supervision of high risk pregnancy, unspecified, unspecified trimester: Secondary | ICD-10-CM

## 2020-12-12 DIAGNOSIS — F32A Depression, unspecified: Secondary | ICD-10-CM

## 2020-12-12 DIAGNOSIS — K117 Disturbances of salivary secretion: Secondary | ICD-10-CM

## 2020-12-12 DIAGNOSIS — Z8639 Personal history of other endocrine, nutritional and metabolic disease: Secondary | ICD-10-CM | POA: Insufficient documentation

## 2020-12-12 MED ORDER — PROGESTERONE 200 MG VA SUPP: 200.0000 mg | Freq: Every day | VAGINAL | Status: DC

## 2020-12-12 NOTE — Progress Notes (Signed)
MFM Consult Note  Vanessa Henson is a 35 yo G5P3 at 70 w 0d who is seen today at the request of Dr. Aletha Halim.  She is dated by a 7 w 3d  ultrasound performed on 10/10/20 with an EDD of 05/12/21.  She is seen today for a detailed ultrasound given that she is Advanced maternal age.  Her pregnancy history is complicated by the following:  1) AMA.  She had a low risk NIPS, neg AFP and horizon. She declines additional testing.   2) Prior preterm delivery: She had a two prior term deliveries in 2008 and 2010. She had a cesarean delivery in 2008 and subsequent VBAC's. In 2014 she presented with new onset PPROM and delivered without complications. However, she notes that her child had ongoing chronic respiratory issues. She was counseled per her providers notes on progesterone therapy but understood the counseling to initiate therapy if there was change. She notes that she feels "tingling down there" as she did in her prior pregnancy.  3) History of T2 DM.  She had a loss of 62 lbs that resolved her insulin insensitivity. She had a HgbA1c on 09/14 that was 5.3.   She had a normal TSH, CMP, CBC and UPC.  Vitals with BMI 12/12/2020 11/22/2020 10/25/2020  Height - - -  Weight - 147 lbs 141 lbs 3 oz  BMI - - 20.94  Systolic [phone]  Diastolic 55 57 79  Pulse 68 71 78   CBC Latest Ref Rng & Units 10/25/2020 09/26/2020 12/08/2019  WBC 3.4 - 10.8 x10E3/uL 13.0(H) 10.0 9.9  Hemoglobin 11.1 - 15.9 g/dL 12.7 12.1 12.3  Hematocrit 34.0 - 46.6 % 36.6 36.0 36.4  Platelets 150 - 450 x10E3/uL 240 217 235   CMP Latest Ref Rng & Units 10/25/2020 12/08/2019 02/09/2019  Glucose 65 - 99 mg/dL 81 91 -  BUN 6 - 20 mg/dL 14 16 -  Creatinine 0.57 - 1.00 mg/dL 0.55(L) 0.65 -  Sodium 134 - 144 mmol/L 135 138 -  Potassium 3.5 - 5.2 mmol/L 4.1 3.8 -  Chloride 96 - 106 mmol/L 101 103 -  CO2 20 - 29 mmol/L 22 24 -  Calcium 8.7 - 10.2 mg/dL 9.4 8.9 -  Total Protein 6.0 - 8.5 g/dL 6.9 7.5 7.6  Total  Bilirubin 0.0 - 1.2 mg/dL <0.2 0.2 0.5  Alkaline Phos 44 - 121 IU/L 73 111 127(H)  AST 0 - 40 IU/L 13 11 24   ALT 0 - 32 IU/L 14 10 35(H)   Past Medical History:  Diagnosis Date   DM type 2 (diabetes mellitus, type 2) ([place]) 12/14/2015   Resolved in 2021 with 62 lb weight loss.   Fibroid, uterine 2022   Gallstones 2014   Biliary sludge of pregnancy   Gestational diabetes 2010   Prediabetes 2017   Renal disorder 2008   kidney stones   Past Surgical History:  Procedure Laterality Date   CESAREAN SECTION  2008   EYE SURGERY Right 2012   ?removal of [place]alazion--[place]   OB History  Gravida Para Term Preterm AB Living  5 3 2 1 1 3   SAB IAB Ectopic Multiple Live Births  1 0 0 0 3    # Outcome Date GA Lbr Len/2nd Weight Sex Delivery Anes PTL Lv  5 Current           4 Preterm 07/07/12 [redacted]w[redacted]d 02:32 / 00:35 5 lb 7.8 oz (2.489 kg) F VBAC EPI  LIV  Birth Comments: PPROM     Name: SANCHEZ VICENTE,GIRL Kyrsten     Apgar1: 6  Apgar5: 8  3 Term 2010 [redacted]w[redacted]d  7 lb (3.175 kg) F VBAC EPI  LIV     Birth Comments: VBAC, GDM  2 Term 2008 [redacted]w[redacted]d  7 lb (3.175 kg) F CS-LTranv   LIV     Birth Comments: wnl except c/s for ftp  1 SAB 2006 [redacted]w[redacted]d       DEC        Current Outpatient Medications (Respiratory):    fluticasone (FLONASE) 50 MCG/ACT nasal spray, Place 2 sprays into both nostrils daily. (Patient not taking: Reported on 11/22/2020)   promethazine (PHENERGAN) 25 MG tablet, Take 1 tablet (25 mg total) by mouth every 6 (six) hours as needed for nausea or vomiting. (Patient not taking: Reported on 11/22/2020)   Current Outpatient Medications (Analgesics):    aspirin EC 81 MG tablet, Take 1 tablet (81 mg total) by mouth daily. Swallow whole. (Patient not taking: Reported on 12/12/2020)   Current Outpatient Medications (Hematological):    FOLIC ACID PO, Take by mouth.   Current Outpatient Medications (Other):    famotidine (PEPCID) 20 MG tablet, Take 1 tablet (20 mg total) by mouth 2  (two) times daily.   glycopyrrolate ([name]-FORTE) 2 MG tablet, Take 1 tablet (2 mg total) by mouth 3 (three) times daily.  Current Facility-Administered Medications (Other):    progesterone suppository 200 mg Allergies  Allergen Reactions   Diphenhydramine Hcl Other (See Comments)    Dizziness and shortness of breath.   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  Tobacco Use   Smoking status: Never   Smokeless tobacco: Never  Vaping Use   Vaping Use: Never used  Substance and Sexual Activity   Alcohol use: No   Drug use: No   Sexual activity: Yes    Birth control/protection: Condom  Other Topics Concern   Not on file  Social History Narrative   Lives on [address] in [place] with husband and 3 daughters   Girls go to [employer]   Social Determinants of Health   Financial Resource Strain: Not on file  Food Insecurity: No Food Insecurity   Worried About Charity fundraiser in the Last Year: Never true   Arboriculturist in the Last Year: Never true  Transportation Needs: No Transportation Needs   Lack of Transportation (Medical): No   Lack of Transportation (Non-Medical): No  Physical Activity: Not on file  Stress: Not on file  Social Connections: Not on file  Intimate Partner Violence: Not on file   Imaging today:  Single intrauterine pregnancy with measurements consistent with dates.  EDD 05/09/21 41% normal amniotic fluid and movement. No markers of aneuploidy.  Impression/Counseling:  I discussed with Vanessa Henson the normal nature of today's ultrasound.  We reviewed her genetic screening and carrier screening. She desired no additional testing. We discussed the increased risk for gestational diabetes, preeclampsia and fetal growth  restriction.   I discussed her prior history of preterm birth. We discussed that the recurrence risk is 12-30%. However, given her prior term pregnancies it is likely reduced as compared to those who had a sPTB and no term pregnancies.  I reviewed the recommendation  of progesterone therapy in the form of weekly IM shots vs nightly progesterone as a risk reduction strategy for recurrent PTB.  She opted for nightly progesterone therapy, this was sent to her pharmacy today.  In addition, we recommend screening her cervical length every 2 weeks until 24 weeks.  Lastly, she has a history of T2DM but appears to have resolved. We recommend early GTT and again at 28 weeks.  Given the above risk profile we recommend daily low dose ASA and serial growth exam every 4-6 weeks.   All questions answered.  This counseling was conducted via interpreter.  I spent 60 minutes with > 50% in face to face consultation.  Vikki Ports, MD.

## 2020-12-13 ENCOUNTER — Telehealth: Payer: Self-pay | Admitting: *Deleted

## 2020-12-13 MED ORDER — PROGESTERONE 200 MG PO CAPS: 200.0000 mg | ORAL_CAPSULE | Freq: Every day | ORAL | 3 refills | Status: DC

## 2020-12-13 NOTE — Telephone Encounter (Signed)
I called patient with Interpreter Paulino Door and informed her of progesterone RX being sent in . She states she remembers discussion with MFM. I reviewed instructions. She voices understanding. Solimar Maiden,RN

## 2020-12-13 NOTE — Telephone Encounter (Signed)
-----   Message from Donnamae Jude, MD sent at 12/13/2020  8:10 AM EDT ----- Please call and tell pt. We have sent in the nightly progesterone that she agreed to with MFM due to prior preterm birth.

## 2020-12-14 ENCOUNTER — Encounter: Payer: No Typology Code available for payment source | Admitting: Obstetrics & Gynecology

## 2020-12-18 ENCOUNTER — Ambulatory Visit (INDEPENDENT_AMBULATORY_CARE_PROVIDER_SITE_OTHER): Payer: Self-pay | Admitting: Nurse Practitioner

## 2020-12-18 ENCOUNTER — Other Ambulatory Visit: Payer: Self-pay

## 2020-12-18 VITALS — BP 100/58 | HR 71 | Ht 60.24 in | Wt 157.6 lb

## 2020-12-18 DIAGNOSIS — Z789 Other specified health status: Secondary | ICD-10-CM

## 2020-12-18 DIAGNOSIS — Z3A19 19 weeks gestation of pregnancy: Secondary | ICD-10-CM

## 2020-12-18 DIAGNOSIS — Z98891 History of uterine scar from previous surgery: Secondary | ICD-10-CM

## 2020-12-18 DIAGNOSIS — Z8751 Personal history of pre-term labor: Secondary | ICD-10-CM

## 2020-12-18 DIAGNOSIS — O0990 Supervision of high risk pregnancy, unspecified, unspecified trimester: Secondary | ICD-10-CM

## 2020-12-18 NOTE — Progress Notes (Signed)
    Subjective:  Vanessa Henson is a 35 y.o. [phone] at [redacted]w[redacted]d being seen today for ongoing prenatal care.  She is currently monitored for the following issues for this high-risk pregnancy and has History of gestational diabetes; History of VBAC; Language barrier, cultural differences; Obesity; Seasonal allergies; Elevated LDL cholesterol level; Supervision of high risk pregnancy, antepartum; Fibroid, uterine; AMA (advanced maternal age) multigravida 35+; History of preterm delivery, currently pregnant; and Language barrier on their problem list.  Patient reports no complaints.  Contractions: Not present. Vag. Bleeding: None.  Movement: Present. Denies leaking of fluid.   The following portions of the patient's history were reviewed and updated as appropriate: allergies, current medications, past family history, past medical history, past social history, past surgical history and problem list. Problem list updated.  Objective:   Vitals:   12/18/20 1409 12/18/20 1415  BP: (!) 100/58   Pulse: 71   Weight: 157 lb 9.6 oz (71.5 kg)   Height:  5' 0.24" (1.53 m)    Fetal Status: Fetal Heart Rate (bpm): 147   Movement: Present     General:  Alert, oriented and cooperative. Patient is in no acute distress.  Skin: Skin is warm and dry. No rash noted.   Cardiovascular: Normal heart rate noted  Respiratory: Normal respiratory effort, no problems with respiration noted  Abdomen: Soft, gravid, appropriate for gestational age. Pain/Pressure: Absent     Pelvic:  Cervical exam deferred        Extremities: Normal range of motion.  Edema: None  Mental Status: Normal mood and affect. Normal behavior. Normal judgment and thought content.   Urinalysis:      Assessment and Plan:  Pregnancy: [id number] at [redacted]w[redacted]d  1. Supervision of high risk pregnancy, antepartum Reviewed MFM recommendations Patient is using progesterone suppositories nightly Will get early glucola this week and glucola at 28 weeks  also  2. History of preterm delivery Having some tightening of uterus Encouraged to drink 64 ounces of water daily  3. History of VBAC   4. Language barrier Interpreter present for the entire visit  5. [redacted] weeks gestation of pregnancy   Preterm labor symptoms and general obstetric precautions including but not limited to vaginal bleeding, contractions, leaking of fluid and fetal movement were reviewed in detail with the patient. Please refer to After Visit Summary for other counseling recommendations.  Return in about 4 weeks (around 01/15/2021) for in person ROB.  Earlie Server, RN, MSN, NP-BC Nurse Practitioner, [facility] for [employer], [facility] 12/18/2020 2:20 PM

## 2020-12-20 ENCOUNTER — Other Ambulatory Visit: Payer: Self-pay

## 2020-12-20 ENCOUNTER — Other Ambulatory Visit: Payer: No Typology Code available for payment source

## 2020-12-20 DIAGNOSIS — O0990 Supervision of high risk pregnancy, unspecified, unspecified trimester: Secondary | ICD-10-CM

## 2020-12-21 ENCOUNTER — Telehealth: Payer: Self-pay

## 2020-12-21 LAB — GLUCOSE TOLERANCE, 2 HOURS W/ 1HR
Glucose, 1 hour: 172 mg/dL (ref 70–179)
Glucose, 2 hour: 99 mg/dL (ref 70–152)
Glucose, Fasting: 67 mg/dL — ABNORMAL LOW (ref 70–91)

## 2020-12-21 NOTE — Telephone Encounter (Signed)
-----   Message from Virginia Rochester, NP sent at 12/21/2020  9:58 AM EST ----- Speaks Spanish.  Please call and let her know her glucola was normal now, but one value was close to being abnormal.  Her next glucola will be at 28 weeks and the results may be abnormal at that time.  We would not be surprised if that happens due to the metabolic changes in pregnancy.

## 2020-12-21 NOTE — Telephone Encounter (Signed)
Call placed to pt with interpreter [name]. Spoke with pt. Pt given results and recommendations per Karna Christmas, NP. Pt verbalized understanding and agreeable to plan of care. Colletta [place], RN

## 2020-12-26 ENCOUNTER — Other Ambulatory Visit: Payer: Self-pay

## 2020-12-26 ENCOUNTER — Ambulatory Visit: Payer: Self-pay | Admitting: *Deleted

## 2020-12-26 ENCOUNTER — Encounter: Payer: Self-pay | Admitting: *Deleted

## 2020-12-26 ENCOUNTER — Ambulatory Visit: Payer: Self-pay | Attending: Maternal & Fetal Medicine

## 2020-12-26 ENCOUNTER — Other Ambulatory Visit: Payer: Self-pay | Admitting: Maternal & Fetal Medicine

## 2020-12-26 VITALS — BP 104/60 | HR 74

## 2020-12-26 DIAGNOSIS — O09899 Supervision of other high risk pregnancies, unspecified trimester: Secondary | ICD-10-CM

## 2020-12-26 DIAGNOSIS — O0990 Supervision of high risk pregnancy, unspecified, unspecified trimester: Secondary | ICD-10-CM

## 2020-12-26 DIAGNOSIS — Z3A21 21 weeks gestation of pregnancy: Secondary | ICD-10-CM

## 2020-12-26 DIAGNOSIS — O09522 Supervision of elderly multigravida, second trimester: Secondary | ICD-10-CM

## 2020-12-26 DIAGNOSIS — D259 Leiomyoma of uterus, unspecified: Secondary | ICD-10-CM | POA: Insufficient documentation

## 2020-12-26 DIAGNOSIS — O09892 Supervision of other high risk pregnancies, second trimester: Secondary | ICD-10-CM

## 2020-12-26 DIAGNOSIS — O09212 Supervision of pregnancy with history of pre-term labor, second trimester: Secondary | ICD-10-CM

## 2021-01-09 ENCOUNTER — Telehealth: Payer: Self-pay | Admitting: General Practice

## 2021-01-09 ENCOUNTER — Ambulatory Visit: Payer: No Typology Code available for payment source

## 2021-01-09 NOTE — Telephone Encounter (Signed)
Patient called into front office requesting a call back from a nurse. Patient informed front office that she has had a high fever at times and feels like the baby is moving a lot when this occurs.  Called patient with [name] for spanish interpretation. Patient states she started to run a fever yesterday with body aches & a cough. She states she is worried about the baby because the baby moves a lot when she feels the fever is high. Patient does not have a thermometer at home. She states she has been taking tylenol 500mg  every 4-6 hours. Discussed with patient to take 1000mg  every 6 hours and ibuprofen as needed per Noni Saupe. Recommended she drink at least 2 liters of water a day and she could also try taking robitussin. Advised she could go to urgent care for flu testing and possible medication to reduce duration as well. Reassured patient that fetal movement is a good sign. Patient verbalized understanding to all.

## 2021-01-15 ENCOUNTER — Encounter: Payer: Self-pay | Admitting: Obstetrics and Gynecology

## 2021-01-15 ENCOUNTER — Other Ambulatory Visit: Payer: Self-pay

## 2021-01-15 ENCOUNTER — Ambulatory Visit (INDEPENDENT_AMBULATORY_CARE_PROVIDER_SITE_OTHER): Payer: Self-pay | Admitting: Obstetrics and Gynecology

## 2021-01-15 VITALS — BP 103/66 | HR 71 | Wt 162.6 lb

## 2021-01-15 DIAGNOSIS — Z789 Other specified health status: Secondary | ICD-10-CM

## 2021-01-15 DIAGNOSIS — Z8632 Personal history of gestational diabetes: Secondary | ICD-10-CM

## 2021-01-15 DIAGNOSIS — O0990 Supervision of high risk pregnancy, unspecified, unspecified trimester: Secondary | ICD-10-CM

## 2021-01-15 DIAGNOSIS — O09899 Supervision of other high risk pregnancies, unspecified trimester: Secondary | ICD-10-CM

## 2021-01-15 DIAGNOSIS — Z98891 History of uterine scar from previous surgery: Secondary | ICD-10-CM

## 2021-01-15 DIAGNOSIS — O09522 Supervision of elderly multigravida, second trimester: Secondary | ICD-10-CM

## 2021-01-15 NOTE — Patient Instructions (Signed)
Segundo trimestre de [employer] Second Trimester of Pregnancy El segundo trimestre de Media planner va desde la semana 13 hasta la semana 27. Tambin se dice que va desde el mes 4 hasta el mes 6 de [place]. Este suele ser el momento en el que mejor se siente. Durante el segundo trimestre: Las nuseas del embarazo han disminuido o han desaparecido. Usted puede tener ms energa. Usted puede tener hambre con ms frecuencia. En esta poca, el beb en gestacin (feto) crece muy rpido. Hacia el final del sexto mes, el beb en gestacin puede medir aproximadamente 12 pulgadas y pesar alrededor de 1 libras. Es probable que comience a Surveyor, quantity beb se [employer] las 42 y las [place]. Cambios en el cuerpo durante el segundo trimestre Su organismo contina atravesando por muchos cambios durante este perodo. Los cambios varan y generalmente vuelven a la normalidad despus del nacimiento del beb. Cambios fsicos Aumentar ms peso. Podrn aparecer las primeras estras en las caderas, el vientre (abdomen) y las [place]. Las [employer] crecern y Tourist information centre manager. Pueden aparecer zonas oscuras o manchas en el rostro. Es posible que se forme una lnea oscura desde el ombligo hasta la zona del pubis (linea nigra). Tal vez haya cambios en el cabello. Cambios en la salud Es posible que tenga dolores de [place]. Es posible que tenga acidez estomacal. Es posible que tenga dificultades para defecar (estreimiento). Es posible que tenga hemorroides o venas abultadas e hinchadas (venas varicosas). Las encas pueden sangrarle. Es posible que haga pis (orine) con mayor frecuencia. Puede sentir dolor en la espalda. Siga estas instrucciones en su casa: Medicamentos Use los medicamentos de venta libre y los recetados solamente como se lo haya indicado el mdico. Algunos medicamentos no son seguros Solicitor. Tome vitaminas prenatales que contengan por lo menos 600 microgramos (mcg) de cido  flico. Comida y bebida Consuma comidas saludables que incluyan lo siguiente: Lambert Mody y verduras frescas. Cereales integrales. Buenas fuentes de protenas, como carne, huevos y tofu. Productos lcteos con bajo contenido de [place]. Evite la carne cruda y el [place], la [place] y el queso sin Radio producer. Es posible que deba tomar medidas para prevenir o tratar los problemas para defecar: Electronics engineer suficiente lquido para Contractor pis (orina) de color amarillo plido. Come alimentos ricos en fibra. Entre ellos, frijoles, cereales integrales y frutas y verduras frescas. Limitar los alimentos con alto contenido de grasa y Location manager. Estos incluyen alimentos fritos o dulces. Actividad Haga ejercicios solamente como se lo haya indicado el mdico. La mayora de las personas pueden realizar su actividad fsica habitual durante el [place]. Intente realizar como mnimo 30 minutos de actividad fsica por lo menos 5 das a la [place]. Deje de hacer ejercicio si tiene dolor o clicos en el vientre o en la zona lumbar. No haga ejercicio si hace demasiado calor, hay demasiada humedad o se encuentra en un lugar de mucha altura (altitud elevada). Evite levantar pesos [employer]. Si lo desea, puede continuar teniendo [employer], a menos que el mdico le indique lo contrario. Alivio del dolor y del Tree surgeon Use un sostn que le brinde buen soporte si le duelen las [place]. Dese baos de asiento con agua tibia para Best boy o las molestias causadas por las hemorroides. Use una crema para las hemorroides si el mdico la autoriza. Descanse con las piernas levantadas (elevadas) si tiene calambres en las piernas o dolor en la parte baja de la espalda. Si desarrolla venas abultadas en las piernas: Use  medias de compresin segn las indicaciones de su mdico. Levante los pies durante 15 minutos, 3 o 4 veces por Training and development officer. Limite la sal en sus alimentos. Seguridad Use el cinturn de seguridad en todo momento mientras  vaya en auto. Hable con el mdico si alguien le est haciendo dao o gritando [place]. Estilo de vida No se d baos de inmersin en agua caliente, baos turcos ni saunas. No se haga duchas vaginales. No use tampones ni toallas higinicas perfumadas. Evite el contacto con las bandejas sanitarias de los gatos y la tierra que estos animales usan. Estos contienen grmenes que pueden daar al beb y causar la prdida del beb ya sea aborto espontneo o muerte fetal. No consuma medicamentos a base de hierbas, drogas ilegales, ni medicamentos que el mdico no haya autorizado. No beba alcohol. No fume ni consuma ningn producto que contenga nicotina o tabaco. Si necesita ayuda para dejar de fumar, consulte al mdico. Instrucciones generales Cumpla con todas las visitas de seguimiento. Esto es importante. Consulte a su mdico acerca de dnde se dictan clases prenatales cerca de donde vive. Consulte a su mdico sobre los [employer] debe comer o pdale que la ayude a Pension scheme manager a Editor, commissioning. Dnde buscar ms informacin American Pregnancy Association (Asociacin Americana del Embarazo): americanpregnancy.org [employer] of Obstetricians and Gynecologists (Colegio Estadounidense de Obstetras y Gineclogos): www.acog.org Office on [employer] ([place]): KeywordPortfolios.com.br Comunquese con un mdico si: Tiene un dolor de cabeza que no desaparece despus de Teacher, adult education. Nota cambios en la visin o ve manchas delante de los ojos. Tiene clicos o siente presin o dolor leves en la parte baja del vientre. Sigue sintiendo como si fuera a vomitar (nuseas), vomita o hace deposiciones acuosas (diarrea). Advierte lquido con mal olor que proviene de la vagina. Siente dolor al orinar o hace orina con mal olor. Tiene una gran hinchazn en la cara, las manos, las piernas, los tobillos o los pies. Tiene fiebre. Solicite ayuda de inmediato si: Tiene una prdida de  lquido por la vagina. Tiene sangrado o pequeas prdidas vaginales. Tiene clicos o dolor muy intensos en el vientre. Tiene dificultad para respirar. Sientes dolor en el pecho. Se desmaya. No ha sentido que el beb se moviera durante el perodo de tiempo que le dijo el mdico. Scientist, clinical (histocompatibility and immunogenetics), hinchazn o enrojecimiento nuevos en un brazo o una pierna o se produce un aumento de alguno de estos sntomas. Resumen El segundo trimestre de embarazo va desde la semana 13 hasta la 27 (desde el mes 4 hasta el 6). Consuma comidas saludables. Haga ejercicios tal como le indic el mdico. La mayora de las personas pueden realizar su actividad fsica habitual durante el [place]. No consuma medicamentos a base de hierbas, drogas ilegales, ni medicamentos que el mdico no haya autorizado. No beba alcohol. Llame al mdico si se enferma o si nota algo inusual acerca de su embarazo. Esta informacin no tiene Marine scientist el consejo del mdico. Asegrese de hacerle al mdico cualquier pregunta que tenga. Document Revised: 08/13/2019 Document Reviewed: 08/13/2019 Elsevier Patient Education  [place].

## 2021-01-15 NOTE — Progress Notes (Signed)
Subjective:  Vanessa Henson is a 35 y.o. [phone] at [redacted]w[redacted]d being seen today for ongoing prenatal care.  She is currently monitored for the following issues for this high-risk pregnancy and has History of gestational diabetes; History of VBAC; Language barrier, cultural differences; Obesity; Seasonal allergies; Elevated LDL cholesterol level; Supervision of high risk pregnancy, antepartum; Fibroid, uterine; AMA (advanced maternal age) multigravida 35+; History of preterm delivery, currently pregnant; and Language barrier on their problem list.  Patient reports no complaints.  Contractions: Not present. Vag. Bleeding: None.  Movement: Present. Denies leaking of fluid.   The following portions of the patient's history were reviewed and updated as appropriate: allergies, current medications, past family history, past medical history, past social history, past surgical history and problem list. Problem list updated.  Objective:   Vitals:   01/15/21 1339  BP: 103/66  Pulse: 71  Weight: 162 lb 9.6 oz (73.8 kg)    Fetal Status: Fetal Heart Rate (bpm): 148   Movement: Present     General:  Alert, oriented and cooperative. Patient is in no acute distress.  Skin: Skin is warm and dry. No rash noted.   Cardiovascular: Normal heart rate noted  Respiratory: Normal respiratory effort, no problems with respiration noted  Abdomen: Soft, gravid, appropriate for gestational age. Pain/Pressure: Absent     Pelvic:  Cervical exam deferred        Extremities: Normal range of motion.  Edema: None  Mental Status: Normal mood and affect. Normal behavior. Normal judgment and thought content.   Urinalysis:      Assessment and Plan:  Pregnancy: [id number] at [redacted]w[redacted]d  1. Supervision of high risk pregnancy, antepartum Stable Glucola next visit  2. Multigravida of advanced maternal age in second trimester Genetic testing negative  3. History of preterm delivery, currently pregnant No S/Sx at present for F/U  CL later this week  4. History of VBAC Successful x 2 Sign papers at later OB visit  5. Language barrier Live interrupter used during today's visit  6. History of gestational diabetes Normal A1c Glucola next visit  Preterm labor symptoms and general obstetric precautions including but not limited to vaginal bleeding, contractions, leaking of fluid and fetal movement were reviewed in detail with the patient. Please refer to After Visit Summary for other counseling recommendations.  Return in about 4 weeks (around 02/12/2021) for OB visit, face to face, any provider, fasting for Glucola.   Chancy Milroy, MD

## 2021-01-19 ENCOUNTER — Ambulatory Visit: Payer: Self-pay | Admitting: *Deleted

## 2021-01-19 ENCOUNTER — Ambulatory Visit: Payer: Self-pay

## 2021-01-19 ENCOUNTER — Other Ambulatory Visit: Payer: Self-pay

## 2021-01-19 VITALS — BP 96/50 | HR 73

## 2021-02-11 NOTE — L&D Delivery Note (Addendum)
Operative Delivery Note ?At 2:21 AM a viable female was delivered via VBAC, Vacuum Assisted.  Presentation: vertex; Position: Left,, Occiput,, Anterior; Station: +3. ? ?2 hours of maternal effort, becoming exhausted ?I made assessment and felt positive regarding a vacuum assistance ?Pt agreed ?7 contractions were required for safe steady progress ? ?As anticipated there was a shoulder dystocia ?The right arm was posterior and was behind the back ?No head traction was used ?I rotated the thorax to the left to create a little more room  ?Because the right arm was behind the back I could not safely deliver it ?I did mini rotations (mini [place] screw)back and forth to create some room and hopefully mini displacement. Full Woods screw was not possible ?The right arm then had a small bend at the elbow which I used to bring the right arm from behind the back to the front of the chest, underneath the right chest ?I then delivered the posterior arm without difficulty and did quick cord clamping and handed off to Dr Barbaraann Rondo ? ?The shoulder dystocia was recorded as 90 seconds  ? ?Verbal consent: obtained from patient.  Risks and benefits discussed in detail.  Risks include, but are not limited to the risks of anesthesia, bleeding, infection, damage to maternal tissues, fetal cephalhematoma.  There is also the risk of inability to effect vaginal delivery of the head, or shoulder dystocia that cannot be resolved by established maneuvers, leading to the need for emergency cesarean section. ? ?APGAR: 3, 7; weight  .  pending ?Placenta status: , .intact delivered   ?Cord:  with the following complications: .  Cord pH: 7.23 arterial ? ?Anesthesia:   ?Instruments: Mity vac bell ?Episiotomy: None ?Lacerations: 3rd degree(3A) ?Suture Repair: 3.0 monocryl ?Est. Blood Loss (mL): 250 ? ?Mom to postpartum.  Baby to Couplet care / Skin to Skin. ? ?Vanessa Henson ?04/29/2021, 3:05 AM ? ? ? ?

## 2021-02-13 ENCOUNTER — Other Ambulatory Visit: Payer: Self-pay

## 2021-02-13 ENCOUNTER — Ambulatory Visit: Payer: No Typology Code available for payment source | Admitting: *Deleted

## 2021-02-13 ENCOUNTER — Other Ambulatory Visit: Payer: Self-pay | Admitting: *Deleted

## 2021-02-13 ENCOUNTER — Ambulatory Visit: Payer: Self-pay | Attending: Maternal & Fetal Medicine

## 2021-02-13 ENCOUNTER — Encounter: Payer: Self-pay | Admitting: *Deleted

## 2021-02-13 VITALS — BP 106/58 | HR 78

## 2021-02-13 DIAGNOSIS — O09522 Supervision of elderly multigravida, second trimester: Secondary | ICD-10-CM | POA: Insufficient documentation

## 2021-02-13 DIAGNOSIS — O09899 Supervision of other high risk pregnancies, unspecified trimester: Secondary | ICD-10-CM

## 2021-02-13 DIAGNOSIS — O0990 Supervision of high risk pregnancy, unspecified, unspecified trimester: Secondary | ICD-10-CM

## 2021-02-13 DIAGNOSIS — D259 Leiomyoma of uterus, unspecified: Secondary | ICD-10-CM

## 2021-02-13 DIAGNOSIS — O34219 Maternal care for unspecified type scar from previous cesarean delivery: Secondary | ICD-10-CM

## 2021-02-13 DIAGNOSIS — O09523 Supervision of elderly multigravida, third trimester: Secondary | ICD-10-CM

## 2021-02-13 DIAGNOSIS — Z3A28 28 weeks gestation of pregnancy: Secondary | ICD-10-CM

## 2021-02-13 DIAGNOSIS — O09893 Supervision of other high risk pregnancies, third trimester: Secondary | ICD-10-CM

## 2021-02-13 DIAGNOSIS — O09293 Supervision of pregnancy with other poor reproductive or obstetric history, third trimester: Secondary | ICD-10-CM

## 2021-02-14 ENCOUNTER — Encounter: Payer: Self-pay | Admitting: Family Medicine

## 2021-02-14 ENCOUNTER — Other Ambulatory Visit: Payer: No Typology Code available for payment source

## 2021-02-14 ENCOUNTER — Ambulatory Visit (INDEPENDENT_AMBULATORY_CARE_PROVIDER_SITE_OTHER): Payer: Self-pay | Admitting: Family Medicine

## 2021-02-14 VITALS — BP 91/54 | HR 77 | Wt 167.9 lb

## 2021-02-14 DIAGNOSIS — Z98891 History of uterine scar from previous surgery: Secondary | ICD-10-CM

## 2021-02-14 DIAGNOSIS — Z789 Other specified health status: Secondary | ICD-10-CM

## 2021-02-14 DIAGNOSIS — O0990 Supervision of high risk pregnancy, unspecified, unspecified trimester: Secondary | ICD-10-CM

## 2021-02-14 DIAGNOSIS — O219 Vomiting of pregnancy, unspecified: Secondary | ICD-10-CM

## 2021-02-14 DIAGNOSIS — O09523 Supervision of elderly multigravida, third trimester: Secondary | ICD-10-CM

## 2021-02-14 DIAGNOSIS — O09899 Supervision of other high risk pregnancies, unspecified trimester: Secondary | ICD-10-CM

## 2021-02-14 MED ORDER — GLYCOPYRROLATE 2 MG PO TABS: 2.0000 mg | ORAL_TABLET | Freq: Three times a day (TID) | ORAL | 2 refills | Status: DC

## 2021-02-14 NOTE — Patient Instructions (Signed)
Tercer trimestre de Media planner Third Trimester of Pregnancy El tercer trimestre de embarazo va desde la semana 28 hasta la semana 40. Esto corresponde a los meses 7 a 9. El tercer trimestre es un perodo en el que el beb en gestacin (feto) crece rpidamente. Hacia el final del noveno mes, el feto mide alrededor de 20pulgadas (45cm) de largo y pesa entre 6 y 73 libras (2.7 y 4.5kg). Cambios en el cuerpo durante el tercer trimestre Durante el tercer trimestre, su cuerpo contina experimentando numerosos cambios. Los cambios varan y generalmente vuelven a la normalidad despus del nacimiento del beb. Cambios fsicos Seguir [employer]. Es de esperar que aumente entre 25 y 35libras (63 y 16kg) Optometrist final del embarazo si inicia el Media planner con un peso normal. Si tiene bajo [place], es de esperar que aumente entre 28 y 40 libras (13 y18 kg), y si tiene sobrepeso, es de esperar que aumente entre 45 y 25 libras (7 y 72kg). Podrn aparecer las primeras Apache Corporation caderas, el abdomen y las [place]. [place] [employer] Corporation seguirn creciendo y Tourist information centre manager. Un lquido amarillo Public affairs consultant) puede salir de sus pechos. Esta es la primera leche que usted produce para su beb. Tal vez haya cambios en el cabello. Esto cambios pueden incluir su engrosamiento, crecimiento rpido y Harley[employer]Davidson textura. A algunas personas tambin se les cae el cabello durante o despus del [place], o tienen el cabello seco o fino. El ombligo puede salir hacia afuera. Puede observar que se le Micron Technology, el rostro o los tobillos. Cambios en la salud Es posible que tenga acidez estomacal. Puede sufrir estreimiento. Puede desarrollar hemorroides. Puede desarrollar venas hinchadas y abultadas (venas varicosas) en las piernas. Puede presentar ms dolor en la pelvis, la espalda o los muslos. Esto se debe al Southern Company de peso y al aumento de las hormonas que relajan las articulaciones. Puede presentar un aumento del hormigueo o  entumecimiento en las manos, brazos y piernas. La piel de su abdomen tambin puede sentirse entumecida. Puede sentir que le falta el aire debido a que se expande el tero. Otros cambios Puede tener necesidad de Garment/textile technologist con ms frecuencia porque el feto baja hacia la pelvis y ejerce presin sobre la vejiga. Puede tener ms problemas para dormir. Esto puede deberse al tamao de su abdomen, una mayor necesidad de orinar y un aumento en el metabolismo de su cuerpo. Puede notar que el feto baja o lo siente ms bajo, en el abdomen (aligeramiento). Puede tener un aumento de la secrecin vaginal. Puede notar que tiene dolor alrededor del hueso plvico a medida que el tero se distiende. Siga estas instrucciones en su casa: [place] instrucciones del mdico en relacin con el uso de medicamentos. Durante el embarazo, hay medicamentos que pueden tomarse y 17 que no. No tome ningn medicamento a menos que lo haya autorizado el mdico. Tome vitaminas prenatales que contengan por lo menos [id number] (mcg) de cido flico. Comida y bebida Lleve una dieta saludable que incluya frutas y verduras frescas, cereales integrales, buenas fuentes de protenas como carnes [place], huevos o tofu, y productos lcteos descremados. Evite la carne cruda y el [place], la [place] y el queso sin Radio producer. Estos portan grmenes que pueden provocar dao tanto a usted como al beb. Tome 4 o 5 comidas pequeas en lugar de 3 comidas abundantes al da. Es posible que tenga que tomar estas medidas para prevenir o tratar el estreimiento: Electronics engineer suficiente lquido como para Consulting civil engineer Bennie Hind  de color amarillo plido. Consumir alimentos ricos en fibra, como frijoles, cereales integrales, y frutas y verduras frescas. Limitar el consumo de alimentos ricos en grasa y azcares procesados, como los alimentos fritos o dulces. Actividad Haga ejercicio solamente como se lo haya indicado el mdico. La mayora de las personas  pueden continuar su actividad fsica habitual durante el [place]. Intente realizar como mnimo 38minutos de actividad fsica por lo menos 5das a la semana. Deje de hacer ejercicio si experimenta contracciones en el tero. Deje de hacer ejercicio si le aparecen dolor o clicos en la parte baja del vientre o de la espalda. Evite levantar pesos EMCOR. No haga ejercicio si hace mucho calor o humedad, o si se encuentra a una altitud elevada. Si lo desea, puede seguir teniendo [employer], salvo que el mdico le indique lo contrario. Alivio del dolor y del [place] pausas frecuentes y descanse con las piernas levantadas (elevadas) si tiene calambres en las piernas o dolor en la parte baja de la espalda. Dese baos de asiento con agua tibia para Best boy o las molestias causadas por las hemorroides. Use una crema para las hemorroides si el mdico la autoriza. Use un sujetador que le brinde buen soporte para prevenir las molestias causadas por la sensibilidad en las [place]. Si tiene venas varicosas: Use medias de compresin como se lo haya indicado el mdico. Eleve los pies durante 77minutos, 3 o 4veces por Training and development officer. Limite el consumo de sal en su dieta. Seguridad Hable con su mdico antes de viajar distancias largas. No se d baos de inmersin en agua caliente, baos turcos ni saunas. Use el cinturn de seguridad en todo momento mientras conduce o va en auto. Hable con el mdico si es vctima de Terex Corporation o fsico. Preparacin para el nacimiento Para prepararse para la llegada de su beb: Tome clases prenatales para entender, Psychologist, prison and probation services, y hacer preguntas sobre el [place] de parto y [place]. Visite el hospital y recorra el rea de maternidad. Compre un asiento de seguridad [employer], y asegrese de saber cmo instalarlo en su automvil. Prepare la habitacin o el lugar donde dormir el beb. Asegrese de quitar todas las almohadas y [place] de peluche de  la cuna del beb para evitar la asfixia. Indicaciones generales Evite el contacto con las bandejas sanitarias de los gatos y la tierra que estos animales usan. Estos alimentos contienen bacterias que pueden causar defectos congnitos en el beb. Si tiene Research scientist (physical sciences), pdale a alguien que limpie la caja de arena por usted. No se haga lavados vaginales ni use tampones. No use toallas higinicas perfumadas. No consuma ningn producto que contenga nicotina o tabaco, como cigarrillos, cigarrillos electrnicos y tabaco de Higher education careers adviser. Si necesita ayuda para dejar de consumir estos productos, consulte al mdico. No use ningn remedio a base de hierbas, drogas ilegales o medicamentos que no le hayan sido recetados. Las sustancias qumicas de estos productos pueden daar al beb. No beba alcohol. Le realizarn exmenes prenatales ms frecuentes durante el tercer trimestre. Durante una visita prenatal de rutina, el mdico le har un examen fsico, Teacher, early years/pre pruebas y Electrical engineer con usted de su salud general. Cumpla con todas las visitas de seguimiento. Esto es importante. Dnde buscar ms informacin American Pregnancy Association (Asociacin Estadounidense del Embarazo): americanpregnancy.org SPX Corporation of Obstetricians and Gynecologists (Colegio Estadounidense de Obstetras y [place]): [email or web address]? Office on Home Depot ([place]): [email or web address] Comunquese con un mdico si tiene: Systems analyst. Clicos leves en la  pelvis, presin en la pelvis o dolor persistente en la zona abdominal o la parte baja de la espalda. Vmitos o diarrea. Secrecin vaginal con mal olor u orina con mal olor. Dolor al [place]. Un dolor de cabeza que no desaparece despus de Teacher, adult education. Cambios en la visin o ve manchas delante de los ojos. Solicite ayuda de inmediato si: Rompe la bolsa. Tiene contracciones regulares separadas por menos de 4minutos. Tiene sangrado o  pequeas prdidas vaginales. Siente un dolor abdominal intenso. Tiene dificultad para respirar. Siente dolor en el pecho. Sufre episodios de Kimber[employer]. No ha sentido a su beb moverse durante el perodo de Agilent Technologies indic el mdico. Tiene dolor, hinchazn o enrojecimiento nuevos en un brazo o una pierna o se produce un aumento de alguno de estos sntomas. Resumen El tercer trimestre del Media planner comprende desde la [id number] hasta la semana 61 (desde el mes7 hasta el mes9). Puede tener ms problemas para dormir. Esto puede deberse al tamao de su abdomen, una mayor necesidad de orinar y un aumento en el metabolismo de su cuerpo. Le realizarn exmenes prenatales ms frecuentes durante el tercer trimestre. Cumpla con todas las visitas de seguimiento. Esto es importante. Esta informacin no tiene Marine scientist el consejo del mdico. Asegrese de hacerle al mdico cualquier pregunta que tenga. Document Revised: 08/06/2019 Document Reviewed: 08/06/2019 Elsevier Patient Education  2022 [place] vaginal despus de un parto por cesrea Vaginal Birth After Cesarean Delivery Un parto vaginal despus de un parto por cesrea (PVDC) significa dar a luz por la vagina despus de haber dado a luz anteriormente por medio de una intervencin quirrgica llamada cesrea. Es posible que el PVDC sea una alternativa segura para usted, segn su salud y [place]ar[place] manager. Hable con su mdico acerca del PVDC desde comienzos del embarazo, de modo que pueda [employer] K[employer] Sons, beneficios y opciones. Hablar sobre estos temas desde el comienzo le permitir tener tiempo para Development worker, international aid. Qu aumenta las probabilidades de un PVDC satisfactorio? Estos factores aumentan sus probabilidades de tener un PVDC satisfactorio: Ha tenido una sola cesrea anterior. Tuvo una incisin transversal baja para la cesrea. Ha tenido un parto vaginal satisfactorio. Su trabajo de parto comienza de forma  natural en su fecha de parto o antes. Usted y el beb han tenido un embarazo saludable. El beb est ubicado con la cabeza [place]. Sander Nephew ocurrir cuando llegue al centro de [place] o al hospital? Dollene [place] que se inicie el [place] de parto y haya sido admitida en el hospital o centro de [place] equipo de atencin mdica podr hacer lo siguiente: Revisar sus antecedentes de Media planner y atender cualquier inquietud que usted pueda tener. Hablar con usted sobre su plan de nacimiento y Physiological scientist las opciones para Financial controller. Controlar su presin arterial, frecuencia respiratoria y frecuencia cardaca. Verificar el latido cardaco del beb. Monitorear el tero para TEFL teacher. Verificar si la bolsa de agua (saco amnitico) se ha roto (ruptura). Colocarle una va intravenosa en una de las venas. Puede recibir lquidos y [employer]ne[employer] a travs de la va intravenosa. Monitoreo y exmenes Su equipo de atencin mdica puede Chief Technology Officer sus contracciones (monitoreo uterino) y Scientist, forensic cardaca del beb (monitoreo fetal). Es posible que la deban monitorear durante largos perodos de tiempo (de forma continua) con un dispositivo de monitoreo. Su mdico tambin puede Human resources officer fsicos frecuentes. Estos pueden Radio producer cmo y dnde est ubicado el beb en el tero y controlar el cuello uterino para  determinar si se est abriendo (dilatando) o afinando (borrando). Ali Molina parto y [employer]? El [place] de parto y el parto normales se dividen en tres etapas: Etapa 1 Esta es la etapa ms larga del trabajo de [place]. Durante esta etapa, sentir contracciones. En general, las contracciones son leves, infrecuentes e irregulares al principio. Se hacen ms fuertes, ms frecuentes (aproximadamente cada 2 o 3 minutos) y ms regulares a medida que avanza en esta etapa. La primera etapa finaliza cuando el cuello uterino est completamente dilatado hasta 4  pulgadas (10cm) y borrado. Etapa 2 Esta etapa comienza una vez que el cuello uterino est completamente dilatado y borrado, y dura hasta el nacimiento del beb. En esta etapa, va a sentir ganas de pujar al beb fuera de la vagina. Puede sentir un dolor urente y por estiramiento, especialmente cuando la parte ms ancha de la cabeza del beb pasa a travs de la abertura vaginal (coronacin). Una vez que el beb nace, el cordn umbilical se pinzar y se cortar. Colocarn al beb sobre su pecho desnudo y [place] con Josefine Class abrigada. Si tiene Microbiologist, observe al beb para detectar seales de hambre, como el reflejo de bsqueda o succin. Etapa 3 Esta etapa comienza inmediatamente despus de que el beb nace y finaliza despus de la expulsin de la placenta. Esta etapa puede durar de 5 a 30 minutos. Qu puedo esperar despus del [place] de parto y [place]? Una vez que termine el trabajo de [place], se los controlar a usted y al beb para Systems analyst seguridad de que ambos estn sanos, y se los supervisar hasta que estn listos para irse a casa. Es posible que le recomienden que permanezca en la misma habitacin con su beb para fomentar un vnculo temprano y Transport planner. Se le controlar y Community education officer el tero con regularidad (masaje fndico). Puede seguir recibiendo lquidos o medicamentos por va intravenosa. Tendr algo de inflamacin y dolor en el abdomen, la vagina y la zona de la piel entre la abertura vaginal y el ano (perineo). Si se le realiz una incisin cerca de la vagina (episiotoma) o si ha tenido Biomedical scientist vaginal durante el parto, pueden usarse compresas fras para reducir Conservation officer, historic buildings y la hinchazn. Siga estas instrucciones en su casa: Cuidado de la incisin Si tiene una incisin (episiotoma), siga las instrucciones del mdico acerca del cuidado de la incisin. Controle la zona de la incisin todos los das para detectar signos de infeccin. Est atenta a  los siguientes signos: Enrojecimiento, hinchazn o dolor. Ms lquido Delorise Shiner. Calor. Pus o mal olor. Si se lo indican, aplique hielo en la zona de la episiotoma. Para hacer esto: Ponga el hielo en una bolsa plstica. Coloque una toalla entre la piel y Therapist, nutritional. Aplique el hielo durante 12minutos, 2 o 3veces por da. Retire el hielo si la piel se pone de color rojo brillante. Esto es PepsiCo. Si no puede sentir dolor, calor o fro, tiene un mayor riesgo de que se dae la zona. Actividad Retome sus actividades normales como se lo haya indicado el mdico. Pregntele al mdico qu actividades son seguras para usted. Evite estar sentado durante largos perodos sin moverse. Levntese y camine un poco cada 1 a 2 horas. Instrucciones generales Use los medicamentos de venta libre y los recetados solamente como se lo haya indicado el mdico. No tome baos de inmersin, no nade ni use el jacuzzi hasta que el mdico lo autorice. Pregunte  si puede ducharse. Thurston Pounds solo le permitan darse baos de [place]. Es normal tener hemorragia vaginal despus del [place]. Use un apsito sanitario para el sangrado vaginal y secrecin. Concurra a [place]. Esto es importante. Dnde buscar ms informacin American Pregnancy Association (Asociacin Americana del Embarazo): americanpregnancy.org SPX Corporation of Obstetricians and Gynecologists (Colegio Estadounidense de Obstetras y Youth worker): acog.org Resumen Un parto vaginal despus de un parto por cesrea (PVDC) significa dar a luz por la vagina despus de haber dado a luz anteriormente por medio de una intervencin quirrgica llamada cesrea. Una vez que se inicie el [place] de parto y haya sido admitida en el hospital o centro de [place], su equipo de atencin mdica podr revisar sus antecedentes de Media planner y atender cualquier inquietud que usted tenga. Aunque la State Farm de las mujeres pueden tener un PVDC satisfactorio, a veces  es necesario tener otra cesrea. Concurra a [place]. Esto es importante. Esta informacin no tiene Marine scientist el consejo del mdico. Asegrese de hacerle al mdico cualquier pregunta que tenga. Document Revised: 02/23/2020 Document Reviewed: 02/23/2020 Elsevier Patient Education  2022 [employer].

## 2021-02-14 NOTE — Progress Notes (Signed)
° °  Subjective:  Vanessa Henson is a 36 y.o. [phone] at 110w1d being seen today for ongoing prenatal care.  She is currently monitored for the following issues for this high-risk pregnancy and has History of gestational diabetes; History of VBAC; Language barrier, cultural differences; Obesity; Seasonal allergies; Elevated LDL cholesterol level; Supervision of high risk pregnancy, antepartum; Fibroid, uterine; AMA (advanced maternal age) multigravida 35+; History of preterm delivery, currently pregnant; and Language barrier on their problem list.  Patient reports no complaints.  Contractions: Not present. Vag. Bleeding: None.  Movement: Present. Denies leaking of fluid.   The following portions of the patient's history were reviewed and updated as appropriate: allergies, current medications, past family history, past medical history, past social history, past surgical history and problem list. Problem list updated.  Objective:   Vitals:   02/14/21 0826  BP: (!) 91/54  Pulse: 77  Weight: 167 lb 14.4 oz (76.2 kg)    Fetal Status: Fetal Heart Rate (bpm): 145   Movement: Present     General:  Alert, oriented and cooperative. Patient is in no acute distress.  Skin: Skin is warm and dry. No rash noted.   Cardiovascular: Normal heart rate noted  Respiratory: Normal respiratory effort, no problems with respiration noted  Abdomen: Soft, gravid, appropriate for gestational age. Pain/Pressure: Absent     Pelvic: Vag. Bleeding: None     Cervical exam deferred        Extremities: Normal range of motion.  Edema: None  Mental Status: Normal mood and affect. Normal behavior. Normal judgment and thought content.   Urinalysis:      Assessment and Plan:  Pregnancy: [id number] at [redacted]w[redacted]d  1. Nausea and vomiting in pregnancy Refill per patient request for robinul - glycopyrrolate ([name]-FORTE) 2 MG tablet; Take 1 tablet (2 mg total) by mouth 3 (three) times daily.  Dispense: 270 tablet; Refill:  2  2. Supervision of high risk pregnancy, antepartum BP and FHR normal Third trimester labs today Declines TDaP  3. History of VBAC X2 Discussed TOLAC, consent signed  4. Multigravida of advanced maternal age in third trimester   5. History of preterm delivery, currently pregnant Taking nightly vaginal progesterone  6. Language barrier Spanish  Preterm labor symptoms and general obstetric precautions including but not limited to vaginal bleeding, contractions, leaking of fluid and fetal movement were reviewed in detail with the patient. Please refer to After Visit Summary for other counseling recommendations.  Return in 2 weeks (on 02/28/2021) for [facility], ob visit.   Clarnce Flock, MD

## 2021-02-14 NOTE — Progress Notes (Signed)
Rx refill for Robinol

## 2021-02-15 ENCOUNTER — Telehealth: Payer: Self-pay | Admitting: General Practice

## 2021-02-15 ENCOUNTER — Encounter: Payer: Self-pay | Admitting: Family Medicine

## 2021-02-15 DIAGNOSIS — O24419 Gestational diabetes mellitus in pregnancy, unspecified control: Secondary | ICD-10-CM | POA: Insufficient documentation

## 2021-02-15 LAB — CBC
Hematocrit: 33.6 % — ABNORMAL LOW (ref 34.0–46.6)
Hemoglobin: 11.1 g/dL (ref 11.1–15.9)
MCH: 29.4 pg (ref 26.6–33.0)
MCHC: 33 g/dL (ref 31.5–35.7)
MCV: 89 fL (ref 79–97)
Platelets: 255 10*3/uL (ref 150–450)
RBC: 3.77 x10E6/uL (ref 3.77–5.28)
RDW: 11.6 % — ABNORMAL LOW (ref 11.7–15.4)
WBC: 11.7 10*3/uL — ABNORMAL HIGH (ref 3.4–10.8)

## 2021-02-15 LAB — HIV ANTIBODY (ROUTINE TESTING W REFLEX): HIV Screen 4th Generation wRfx: NONREACTIVE

## 2021-02-15 LAB — RPR: RPR Ser Ql: NONREACTIVE

## 2021-02-15 LAB — GLUCOSE TOLERANCE, 2 HOURS W/ 1HR
Glucose, 1 hour: 247 mg/dL — ABNORMAL HIGH (ref 70–179)
Glucose, 2 hour: 213 mg/dL — ABNORMAL HIGH (ref 70–152)
Glucose, Fasting: 111 mg/dL — ABNORMAL HIGH (ref 70–91)

## 2021-02-15 LAB — ANTIBODY SCREEN: Antibody Screen: NEGATIVE

## 2021-02-15 NOTE — Telephone Encounter (Signed)
-----   Message from Clarnce Flock, MD sent at 02/15/2021  7:49 AM EST ----- Abnormal GTT, 3rd tri labs otherwise normal Patient notified by MyChart Clinical staff please place referral to DM educator and send testing supplies

## 2021-02-15 NOTE — Telephone Encounter (Signed)
Called patient with [name] assisting with spanish interpretation. Informed patient of results and scheduled diabetes education appt for 1/19 @ 315. Patient verbalized understanding to all.

## 2021-02-15 NOTE — Telephone Encounter (Signed)
Called patient with [name] assisted with spanish interpretation, no answer- left message to call us back for results.

## 2021-02-19 ENCOUNTER — Other Ambulatory Visit: Payer: Self-pay | Admitting: *Deleted

## 2021-02-19 DIAGNOSIS — O0990 Supervision of high risk pregnancy, unspecified, unspecified trimester: Secondary | ICD-10-CM

## 2021-02-19 DIAGNOSIS — O24419 Gestational diabetes mellitus in pregnancy, unspecified control: Secondary | ICD-10-CM

## 2021-03-01 ENCOUNTER — Ambulatory Visit (INDEPENDENT_AMBULATORY_CARE_PROVIDER_SITE_OTHER): Payer: Self-pay | Admitting: Obstetrics and Gynecology

## 2021-03-01 ENCOUNTER — Encounter: Payer: Self-pay | Admitting: Obstetrics and Gynecology

## 2021-03-01 ENCOUNTER — Ambulatory Visit (INDEPENDENT_AMBULATORY_CARE_PROVIDER_SITE_OTHER): Payer: Self-pay | Admitting: Registered"

## 2021-03-01 ENCOUNTER — Other Ambulatory Visit: Payer: Self-pay

## 2021-03-01 ENCOUNTER — Encounter: Payer: No Typology Code available for payment source | Attending: Family Medicine | Admitting: Registered"

## 2021-03-01 VITALS — BP 99/64 | HR 80 | Wt 174.4 lb

## 2021-03-01 DIAGNOSIS — Z603 Acculturation difficulty: Secondary | ICD-10-CM

## 2021-03-01 DIAGNOSIS — O24410 Gestational diabetes mellitus in pregnancy, diet controlled: Secondary | ICD-10-CM

## 2021-03-01 DIAGNOSIS — Z98891 History of uterine scar from previous surgery: Secondary | ICD-10-CM

## 2021-03-01 DIAGNOSIS — O09529 Supervision of elderly multigravida, unspecified trimester: Secondary | ICD-10-CM | POA: Insufficient documentation

## 2021-03-01 DIAGNOSIS — Z789 Other specified health status: Secondary | ICD-10-CM

## 2021-03-01 DIAGNOSIS — O24419 Gestational diabetes mellitus in pregnancy, unspecified control: Secondary | ICD-10-CM | POA: Insufficient documentation

## 2021-03-01 DIAGNOSIS — O0990 Supervision of high risk pregnancy, unspecified, unspecified trimester: Secondary | ICD-10-CM

## 2021-03-01 DIAGNOSIS — O09899 Supervision of other high risk pregnancies, unspecified trimester: Secondary | ICD-10-CM

## 2021-03-01 DIAGNOSIS — O09523 Supervision of elderly multigravida, third trimester: Secondary | ICD-10-CM

## 2021-03-01 DIAGNOSIS — Z3A00 Weeks of gestation of pregnancy not specified: Secondary | ICD-10-CM | POA: Insufficient documentation

## 2021-03-01 DIAGNOSIS — D259 Leiomyoma of uterus, unspecified: Secondary | ICD-10-CM

## 2021-03-01 NOTE — Progress Notes (Signed)
Subjective:  Vanessa Henson is a 36 y.o. [phone] at [redacted]w[redacted]d being seen today for ongoing prenatal care.  She is currently monitored for the following issues for this low-risk pregnancy and has History of gestational diabetes; History of VBAC; Language barrier, cultural differences; Obesity; Seasonal allergies; Elevated LDL cholesterol level; Supervision of high risk pregnancy, antepartum; Fibroid, uterine; AMA (advanced maternal age) multigravida 35+; History of preterm delivery, currently pregnant; Language barrier; and GDM (gestational diabetes mellitus) on their problem list.  Patient reports no complaints.  Contractions: Not present. Vag. Bleeding: None.  Movement: Present. Denies leaking of fluid.   The following portions of the patient's history were reviewed and updated as appropriate: allergies, current medications, past family history, past medical history, past social history, past surgical history and problem list. Problem list updated.  Objective:   Vitals:   03/01/21 0854  BP: 99/64  Pulse: 80  Weight: 174 lb 6.4 oz (79.1 kg)    Fetal Status: Fetal Heart Rate (bpm): 145   Movement: Present     General:  Alert, oriented and cooperative. Patient is in no acute distress.  Skin: Skin is warm and dry. No rash noted.   Cardiovascular: Normal heart rate noted  Respiratory: Normal respiratory effort, no problems with respiration noted  Abdomen: Soft, gravid, appropriate for gestational age. Pain/Pressure: Absent     Pelvic:  Cervical exam deferred        Extremities: Normal range of motion.  Edema: None  Mental Status: Normal mood and affect. Normal behavior. Normal judgment and thought content.   Urinalysis:      Assessment and Plan:  Pregnancy: [id number] at [redacted]w[redacted]d  1. Supervision of high risk pregnancy, antepartum Stable  2. History of VBAC Consented  3. Diet controlled gestational diabetes mellitus (GDM) in third trimester Sees DM educator today GDM and pregnancy  reviewed with pt Reduction of risks with glycemic control reviewed  4. Multigravida of advanced maternal age in third trimester Stable  5. Language barrier   6. History of preterm delivery, currently pregnant Stable On progesterone  7. Uterine leiomyoma, unspecified location Stable  Preterm labor symptoms and general obstetric precautions including but not limited to vaginal bleeding, contractions, leaking of fluid and fetal movement were reviewed in detail with the patient. Please refer to After Visit Summary for other counseling recommendations.  No follow-ups on file.   Chancy Milroy, MD

## 2021-03-01 NOTE — Progress Notes (Signed)
[facility] Video Spanish interpreter Effie Shy [phone]  Patient was seen for Gestational Diabetes self-management on 03/01/21  Start time 3:15 pm and End time 4:13 pm   Estimated due date: 05/08/21; [redacted]w[redacted]d  Clinical: Medications: reviewed Medical History: reviewed Labs: OGTT [id number], A1c n/a%   Dietary and Lifestyle History: Pt states she had GDM 8 years ago managed with diet.  Pt reports for last 3 years (before pregnancy) was doing a lot of exercise and lost a lot of weight. Pt states she was eating vegetables, chicken but since was making her nauseas. Pt states she was having trouble drinking water for first 4 months due to nausea and soda was settling easier. Pt states she is eating heavier meals now and gaining weight.  Pt reports since pregnancy has has a lot of nausea and has not been able to tolerate water and instead drinking coffee, juice or soda with meals, also states she started eating a lot of tortillas and more sweets.  Physical Activity: ADL Stress: not assessed Sleep: not assessed  24 hr Recall:  First Meal: 2 eggs, white bread, coffee with 1 1/2 tsp sugar and a little milk Snack: apple, banana, or granola bar Second meal: steak with 1 c rice, 1 c beans OR rice, beans chicken breast OR chicken soup, potatoes, carrots, chayote, green beans, Snack: Third meal: tortilla, rice, beans chicken  Snack: Beverages: water, coffee, milk, juice, soda  NUTRITION INTERVENTION  Nutrition education (E-1) on the following topics:   Initial Follow-up  []  []  Definition of Gestational Diabetes []  []  Why dietary management is important in controlling blood glucose [x]  []  Effects each nutrient has on blood glucose levels []  []  Simple carbohydrates vs complex carbohydrates []  []  Fluid intake [x]  []  Creating a balanced meal plan [x]  []  Carbohydrate counting  [x]  []  When to check blood glucose levels [x]  []  Proper blood glucose monitoring techniques [x]  []  Effect of stress and stress  reduction techniques  [x]  []  Exercise effect on blood glucose levels, appropriate exercise during pregnancy []  []  Importance of limiting caffeine and abstaining from alcohol and smoking []  []  Medications used for blood sugar control during pregnancy []  []  Hypoglycemia and rule of 15 [x]  []  Postpartum self care  Blood glucose monitor given: Prodigy Lot # [id number] CBG: 155 mg/dL 3 hr after egg and beans, small cup of juice  Patient instructed to monitor glucose levels: FBS: 60 - ? 95 mg/dL (some clinics use 90 for cutoff) 1 hour: ? 140 mg/dL 2 hour: ? 120 mg/dL  Patient received handouts: Nutrition Diabetes and Pregnancy Carbohydrate Counting List  Patient will be seen for follow-up as needed.

## 2021-03-05 ENCOUNTER — Other Ambulatory Visit: Payer: Self-pay

## 2021-03-20 ENCOUNTER — Other Ambulatory Visit: Payer: Self-pay | Admitting: *Deleted

## 2021-03-20 ENCOUNTER — Ambulatory Visit: Payer: No Typology Code available for payment source | Admitting: *Deleted

## 2021-03-20 ENCOUNTER — Other Ambulatory Visit: Payer: Self-pay

## 2021-03-20 ENCOUNTER — Ambulatory Visit: Payer: Self-pay | Attending: Obstetrics

## 2021-03-20 ENCOUNTER — Ambulatory Visit (HOSPITAL_BASED_OUTPATIENT_CLINIC_OR_DEPARTMENT_OTHER): Payer: Self-pay | Admitting: Obstetrics

## 2021-03-20 ENCOUNTER — Encounter: Payer: Self-pay | Admitting: *Deleted

## 2021-03-20 VITALS — BP 105/68 | HR 73

## 2021-03-20 DIAGNOSIS — O0990 Supervision of high risk pregnancy, unspecified, unspecified trimester: Secondary | ICD-10-CM | POA: Insufficient documentation

## 2021-03-20 DIAGNOSIS — Z3A33 33 weeks gestation of pregnancy: Secondary | ICD-10-CM | POA: Insufficient documentation

## 2021-03-20 DIAGNOSIS — O09523 Supervision of elderly multigravida, third trimester: Secondary | ICD-10-CM

## 2021-03-20 DIAGNOSIS — O09899 Supervision of other high risk pregnancies, unspecified trimester: Secondary | ICD-10-CM

## 2021-03-20 DIAGNOSIS — O09893 Supervision of other high risk pregnancies, third trimester: Secondary | ICD-10-CM | POA: Insufficient documentation

## 2021-03-20 DIAGNOSIS — O24410 Gestational diabetes mellitus in pregnancy, diet controlled: Secondary | ICD-10-CM | POA: Insufficient documentation

## 2021-03-20 DIAGNOSIS — O34219 Maternal care for unspecified type scar from previous cesarean delivery: Secondary | ICD-10-CM

## 2021-03-20 NOTE — Progress Notes (Signed)
MFM Note  Vanessa Henson was seen for a follow up growth scan due to recently diagnosed diet-controlled gestational diabetes.  She is performing daily fingersticks and reports that some of her fingerstick values are elevated.  She was informed that the fetal growth measures large for her gestational age (33 percentile).  Borderline polyhydramnios is noted with a total AFI of 23.5 cm.  The following were discussed during our consultation today:  Gestational diabetes and pregnancy  The implications and management of diabetes in pregnancy was discussed in detail with the patient.    She was advised to continue to monitor her fingersticks 4 times daily (fasting and 2 hours after each meal).    She was advised that our goals for her fingerstick values are fasting values of 90-95 or less and two-hour postprandial values of 120 or less.    Should the majority of her fingerstick results be above these values, she may have to be started on metformin or insulin to help her achieve better glycemic control. The patient was advised that getting her fingerstick values as close to these goals as possible would provide her with the most optimal obstetrical outcome.  The patient was advised that delivery for well-controlled diabetes in pregnancy is usually recommended at around 39 weeks.  Delivery at 37 weeks may be considered should her glycemic control be poor.  Due to the large for gestational age fetus and borderline polyhydramnios in addition to gestational diabetes, we will continue to follow her with weekly fetal testing until delivery.    A BPP was scheduled in 1 week.  The patient stated that all of her questions have been answered to her satisfaction.    A total of 20 minutes was spent counseling and coordinating the care for this patient.  Greater than 50% of the time was spent in direct face-to-face contact.   All conversations were held with the patient today with the help of a Spanish  interpreter.

## 2021-03-22 ENCOUNTER — Other Ambulatory Visit: Payer: Self-pay

## 2021-03-22 ENCOUNTER — Encounter: Payer: Self-pay | Admitting: Obstetrics and Gynecology

## 2021-03-22 ENCOUNTER — Ambulatory Visit (INDEPENDENT_AMBULATORY_CARE_PROVIDER_SITE_OTHER): Payer: Self-pay | Admitting: Obstetrics and Gynecology

## 2021-03-22 VITALS — BP 97/63 | HR 92 | Wt 178.7 lb

## 2021-03-22 DIAGNOSIS — D259 Leiomyoma of uterus, unspecified: Secondary | ICD-10-CM

## 2021-03-22 DIAGNOSIS — Z789 Other specified health status: Secondary | ICD-10-CM

## 2021-03-22 DIAGNOSIS — Z98891 History of uterine scar from previous surgery: Secondary | ICD-10-CM

## 2021-03-22 DIAGNOSIS — O09899 Supervision of other high risk pregnancies, unspecified trimester: Secondary | ICD-10-CM

## 2021-03-22 DIAGNOSIS — O09523 Supervision of elderly multigravida, third trimester: Secondary | ICD-10-CM

## 2021-03-22 DIAGNOSIS — O0990 Supervision of high risk pregnancy, unspecified, unspecified trimester: Secondary | ICD-10-CM

## 2021-03-22 DIAGNOSIS — O24415 Gestational diabetes mellitus in pregnancy, controlled by oral hypoglycemic drugs: Secondary | ICD-10-CM

## 2021-03-22 MED ORDER — METFORMIN HCL 500 MG PO TABS: 500.0000 mg | ORAL_TABLET | Freq: Two times a day (BID) | ORAL | 5 refills | Status: DC

## 2021-03-22 NOTE — Patient Instructions (Signed)
Tercer trimestre de Media planner Third Trimester of Pregnancy El tercer trimestre de embarazo va desde la semana 28 hasta la semana 40. Tambin se dice que va desde el mes 7 hasta el mes 9. En este trimestre, el beb en gestacin (feto) crece muy rpidamente. Hacia el final del noveno mes, el beb en gestacin mide alrededor de 20 pulgadas (45 cm) de largo. Pesa entre 6 y 37 libras (2,70 y 4,50 kg). Cambios en el cuerpo durante el tercer trimestre Su organismo contina atravesando por muchos cambios durante este perodo. Los cambios varan y generalmente vuelven a la normalidad despus del nacimiento del beb. Cambios fsicos Seguir [employer]. Puede ser que aumente entre 25 y 41 libras (11 y 16 kg) hacia el final del Media planner. Si tiene [employer], puede aumentar entre 28 y 40 lb (unos 13 a 18 kg). Si tiene sobrepeso, puede aumentar entre 15 y 25 libras (unos 7 a 11 kg). Podrn aparecer las primeras estras en las caderas, el vientre (abdomen) y las [place]. [place] [employer] seguirn creciendo y Tourist information centre manager. Un lquido amarillo Public affairs consultant) puede salir de sus pechos. Esta es la primera leche que usted produce para el beb. Tal vez haya cambios en el cabello. El ombligo puede salir hacia afuera. Puede observar que se le hinchan ms las manos, la cara o los tobillos. Cambios en la salud Es posible que tenga acidez estomacal. Es posible que tenga dificultades para defecar (estreimiento). Pueden aparecerle hemorroides. Estas son venas hinchadas en el ano que pueden picar o doler. Puede comenzar a tener venas hinchadas (vrices) en las piernas. Puede presentar ms dolor en la pelvis, la espalda o los muslos. Puede presentar ms hormigueo o entumecimiento en las manos, los brazos y las piernas. La piel de su vientre tambin puede sentirse entumecida. Es posible que sienta falta de aire a medida que el tero se [place]. Otros cambios Es posible que haga pis (orine) con mayor frecuencia. Puede tener ms  problemas para dormir. Puede notar que el beb en gestacin baja o se mueve ms hacia bajo, en el vientre. Puede notar ms secrecin proveniente de la vagina. Puede sentir las articulaciones flojas y Agricultural consultant alrededor del hueso plvico. Siga estas instrucciones en su casa: Medicamentos Use los medicamentos de venta libre y los recetados solamente como se lo haya indicado el mdico. Algunos medicamentos no son seguros Solicitor. Tome vitaminas prenatales que contengan por lo menos 600 microgramos (mcg) de cido flico. Comida y bebida Consuma comidas saludables que incluyan lo siguiente: Lambert Mody y verduras frescas. Cereales integrales. Buenas fuentes de protenas, como carne, huevos y tofu. Productos lcteos con bajo contenido de [place]. Evite la carne cruda y el Dailey, la Mildred y el queso sin Radio producer. Estos portan grmenes que pueden provocar dao tanto a usted como al beb. Tome 4 o 5 comidas pequeas en lugar de 3 comidas abundantes al da. Es posible que deba tomar medidas para prevenir o tratar los problemas para defecar: Electronics engineer suficiente lquido para Contractor pis (orina) de color amarillo plido. Come alimentos ricos en fibra. Entre ellos, frijoles, cereales integrales y frutas y verduras frescas. Limitar los alimentos con alto contenido de grasa y Location manager. Estos incluyen alimentos fritos o dulces. Actividad Haga ejercicios solamente como se lo haya indicado el mdico. Interrumpa la actividad fsica si comienza a tener clicos en el tero. Evite levantar pesos [employer]. No haga ejercicio si hace demasiado calor, hay demasiada humedad o se encuentra en un lugar de Sales executive (  altitud elevada). Si lo desea, puede continuar teniendo [employer], a menos que el mdico le indique lo contrario. Alivio del dolor y del malestar Haga pausas con frecuencia y descanse con las piernas levantadas (elevadas) si tiene calambres en las piernas o dolor en la parte  baja de la espalda. Dese baos de asiento con agua tibia para Best boy o las molestias causadas por las hemorroides. Use una crema para las hemorroides si el mdico la autoriza. Use un sostn que le brinde buen soporte si sus mamas estn sensibles. Si desarrolla venas hinchadas y abultadas en las piernas: Use medias de compresin segn las indicaciones de su mdico. Levante los pies durante 15 minutos, 3 o 4 veces por Training and development officer. Limite la sal en sus alimentos. Seguridad Hable con el mdico antes de Control and instrumentation engineer. No se d baos de inmersin en agua caliente, baos turcos ni saunas. Use el cinturn de seguridad en todo momento mientras vaya en auto. Hable con el mdico si alguien le est haciendo dao o gritando Startup. Preparacin para la llegada del beb Para prepararse para la llegada de su beb: Tome clases prenatales. Visite el hospital y recorra el rea de maternidad. Compre un asiento de seguridad [employer] atrs para llevar al beb en el automvil. Aprenda cmo instalarlo en el auto. Prepare la habitacin del beb. Saque todas las almohadas y los animales de peluche de la cuna del beb. Instrucciones generales Evite el contacto con las bandejas sanitarias de los gatos y la tierra que estos animales usan. Estos contienen grmenes que pueden daar al beb y causar la prdida del beb ya sea aborto espontneo o muerte fetal. No se haga duchas vaginales ni use tampones. No use tampones ni toallas higinicas perfumadas. No fume ni consuma ningn producto que contenga nicotina o tabaco. Si necesita ayuda para dejar de fumar, consulte al mdico. No beba alcohol. No use medicamentos a base de hierbas, drogas ilegales, ni medicamentos que el mdico no haya autorizado. Las sustancias qumicas de estos productos pueden afectar al beb. Cumpla con todas las visitas de seguimiento. Esto es importante. Dnde buscar ms informacin American Pregnancy Association (Asociacin  Americana del Embarazo): americanpregnancy.org [employer] of Obstetricians and Gynecologists (Colegio Estadounidense de Obstetras y Gineclogos): www.acog.org Office on [employer] ([place]): KeywordPortfolios.com.br Comunquese con un mdico si: Tiene fiebre. Tiene clicos leves o siente presin en la parte baja del vientre. Sufre un dolor persistente en el abdomen. Vomita o hace deposiciones acuosas (diarrea). Advierte lquido con mal olor que proviene de la vagina. Siente dolor al orinar o hace orina con mal olor. Tiene un dolor de cabeza que no desaparece despus de Teacher, adult education. Nota cambios en la visin o ve manchas delante de los ojos. Solicite ayuda de inmediato si: Rompe la bolsa. Tiene contracciones regulares separadas por menos de 5 minutos. Tiene sangrado o pequeas prdidas vaginales. Tiene clicos o dolor muy intensos en el vientre. Tiene dificultad para respirar. Sientes dolor en el pecho. Se desmaya. No ha sentido al beb moverse durante el tiempo que le indic el mdico. Tiene dolor, hinchazn o enrojecimiento nuevos en un brazo o una pierna o se produce un aumento de alguno de estos sntomas. Resumen El tercer trimestre comprende desde la semana 28 hasta la semana 40 (desde el mes 7 hasta el mes 9). Esta es la poca en que el beb en gestacin crece muy rpidamente. Durante este perodo, las molestias pueden aumentar a medida que usted sube  de peso y el beb crece. Preprese para la llegada del beb: asista a las clases prenatales, compre un asiento de seguridad orientado hacia atrs para llevar al beb en auto y prepare la habitacin del beb. Solicite ayuda de inmediato si tiene sangrado por la vagina, siente dolor en el pecho y tiene dificultad para respirar, o si no ha sentido al beb moverse durante el tiempo que le indic el mdico. Esta informacin no tiene Marine scientist el consejo del mdico. Asegrese de hacerle al  mdico cualquier pregunta que tenga. Document Revised: 08/11/2019 Document Reviewed: 08/11/2019 Elsevier Patient Education  [place].

## 2021-03-22 NOTE — Progress Notes (Signed)
Subjective:  Vanessa Henson is a 36 y.o. [phone] at [redacted]w[redacted]d being seen today for ongoing prenatal care.  She is currently monitored for the following issues for this high-risk pregnancy and has History of gestational diabetes; History of VBAC; Language barrier, cultural differences; Obesity; Seasonal allergies; Elevated LDL cholesterol level; Supervision of high risk pregnancy, antepartum; Fibroid, uterine; AMA (advanced maternal age) multigravida 35+; History of preterm delivery, currently pregnant; Language barrier; and GDM (gestational diabetes mellitus) on their problem list.  Patient reports general discomforts of pregnancy.  Contractions: Irritability. Vag. Bleeding: None.  Movement: Present. Denies leaking of fluid.   The following portions of the patient's history were reviewed and updated as appropriate: allergies, current medications, past family history, past medical history, past social history, past surgical history and problem list. Problem list updated.  Objective:   Vitals:   03/22/21 1054  BP: 97/63  Pulse: 92  Weight: 178 lb 11.2 oz (81.1 kg)    Fetal Status: Fetal Heart Rate (bpm): 152   Movement: Present     General:  Alert, oriented and cooperative. Patient is in no acute distress.  Skin: Skin is warm and dry. No rash noted.   Cardiovascular: Normal heart rate noted  Respiratory: Normal respiratory effort, no problems with respiration noted  Abdomen: Soft, gravid, appropriate for gestational age. Pain/Pressure: Absent     Pelvic:  Cervical exam deferred        Extremities: Normal range of motion.  Edema: Trace  Mental Status: Normal mood and affect. Normal behavior. Normal judgment and thought content.   Urinalysis:      Assessment and Plan:  Pregnancy: [id number] at [redacted]w[redacted]d  1. Supervision of high risk pregnancy, antepartum Stable  2. Uterine leiomyoma, unspecified location Stable  3. Multigravida of advanced maternal age in third trimester Stable  4.  History of preterm delivery, currently pregnant Stable Continue with Prometrium Growth scan next month  5. Gestational diabetes mellitus (GDM) in third trimester controlled on oral hypoglycemic drug CBG's not in goal range Will start Metformin Indications reviewed Start weekly antenatal testing  6. Language barrier Live interrupter used during today's visit  7. History of VBAC For TOLAC, consented  Preterm labor symptoms and general obstetric precautions including but not limited to vaginal bleeding, contractions, leaking of fluid and fetal movement were reviewed in detail with the patient. Please refer to After Visit Summary for other counseling recommendations.  Return in about 1 week (around 03/29/2021) for OB visit, face to face, MD only.   Chancy Milroy, MD

## 2021-03-29 ENCOUNTER — Other Ambulatory Visit: Payer: Self-pay

## 2021-03-29 ENCOUNTER — Ambulatory Visit (INDEPENDENT_AMBULATORY_CARE_PROVIDER_SITE_OTHER): Payer: No Typology Code available for payment source | Admitting: General Practice

## 2021-03-29 ENCOUNTER — Ambulatory Visit (INDEPENDENT_AMBULATORY_CARE_PROVIDER_SITE_OTHER): Payer: Self-pay

## 2021-03-29 VITALS — BP 86/40 | HR 73

## 2021-03-29 DIAGNOSIS — O24415 Gestational diabetes mellitus in pregnancy, controlled by oral hypoglycemic drugs: Secondary | ICD-10-CM

## 2021-03-29 NOTE — Progress Notes (Signed)
Pt informed that the ultrasound is considered a limited OB ultrasound and is not intended to be a complete ultrasound exam.  Patient also informed that the ultrasound is not being completed with the intent of assessing for fetal or placental anomalies or any pelvic abnormalities.  Explained that the purpose of todays ultrasound is to assess for  BPP, presentation, and AFI.  Patient acknowledges the purpose of the exam and the limitations of the study.     Koren Bound RN BSN 03/29/21

## 2021-03-30 NOTE — Progress Notes (Signed)
Attestation of Attending Supervision of clinical support staff: I agree with the care provided to this patient and was available for any consultation.  I have reviewed the RN's note and chart. I was available for consult and to see the patient if needed.   [place] Souter MD MPH Attending Physician Faculty [facility]- [facility]  

## 2021-04-03 ENCOUNTER — Encounter: Payer: Self-pay | Admitting: *Deleted

## 2021-04-05 ENCOUNTER — Other Ambulatory Visit: Payer: Self-pay

## 2021-04-05 ENCOUNTER — Ambulatory Visit (INDEPENDENT_AMBULATORY_CARE_PROVIDER_SITE_OTHER): Payer: Self-pay

## 2021-04-05 ENCOUNTER — Encounter: Payer: Self-pay | Admitting: Obstetrics and Gynecology

## 2021-04-05 ENCOUNTER — Ambulatory Visit (INDEPENDENT_AMBULATORY_CARE_PROVIDER_SITE_OTHER): Payer: Self-pay | Admitting: Obstetrics and Gynecology

## 2021-04-05 ENCOUNTER — Ambulatory Visit: Payer: No Typology Code available for payment source | Admitting: General Practice

## 2021-04-05 VITALS — BP 100/65 | HR 80 | Wt 177.7 lb

## 2021-04-05 DIAGNOSIS — O24414 Gestational diabetes mellitus in pregnancy, insulin controlled: Secondary | ICD-10-CM

## 2021-04-05 DIAGNOSIS — O0990 Supervision of high risk pregnancy, unspecified, unspecified trimester: Secondary | ICD-10-CM

## 2021-04-05 DIAGNOSIS — Z789 Other specified health status: Secondary | ICD-10-CM

## 2021-04-05 DIAGNOSIS — O09899 Supervision of other high risk pregnancies, unspecified trimester: Secondary | ICD-10-CM

## 2021-04-05 DIAGNOSIS — O09523 Supervision of elderly multigravida, third trimester: Secondary | ICD-10-CM

## 2021-04-05 DIAGNOSIS — D259 Leiomyoma of uterus, unspecified: Secondary | ICD-10-CM

## 2021-04-05 DIAGNOSIS — Z98891 History of uterine scar from previous surgery: Secondary | ICD-10-CM

## 2021-04-05 MED ORDER — "INSULIN SYRINGE 31G X 5/16"" 0.5 ML MISC": 1.0000 | Freq: Every day | 3 refills | Status: AC

## 2021-04-05 MED ORDER — INSULIN NPH (HUMAN) (ISOPHANE) 100 UNIT/ML ~~LOC~~ SUSP: 10.0000 [IU] | Freq: Every day | SUBCUTANEOUS | 3 refills | Status: DC

## 2021-04-05 NOTE — Progress Notes (Signed)
Subjective:  Vanessa Henson is a 36 y.o. [phone] at [redacted]w[redacted]d being seen today for ongoing prenatal care.  She is currently monitored for the following issues for this high-risk pregnancy and has History of gestational diabetes; History of VBAC; Language barrier, cultural differences; Obesity; Seasonal allergies; Elevated LDL cholesterol level; Supervision of high risk pregnancy, antepartum; Fibroid, uterine; AMA (advanced maternal age) multigravida 35+; History of preterm delivery, currently pregnant; Language barrier; and GDM (gestational diabetes mellitus) on their problem list.  Patient reports no complaints.  Contractions: Irritability. Vag. Bleeding: None.  Movement: Present. Denies leaking of fluid.   The following portions of the patient's history were reviewed and updated as appropriate: allergies, current medications, past family history, past medical history, past social history, past surgical history and problem list. Problem list updated.  Objective:   Vitals:   04/05/21 1317  BP: 100/65  Pulse: 80  Weight: 177 lb 11.2 oz (80.6 kg)    Fetal Status: Fetal Heart Rate (bpm): 151   Movement: Present     General:  Alert, oriented and cooperative. Patient is in no acute distress.  Skin: Skin is warm and dry. No rash noted.   Cardiovascular: Normal heart rate noted  Respiratory: Normal respiratory effort, no problems with respiration noted  Abdomen: Soft, gravid, appropriate for gestational age. Pain/Pressure: Present     Pelvic:  Cervical exam deferred        Extremities: Normal range of motion.  Edema: None  Mental Status: Normal mood and affect. Normal behavior. Normal judgment and thought content.   Urinalysis:      Assessment and Plan:  Pregnancy: [id number] at [redacted]w[redacted]d  1. Supervision of high risk pregnancy, antepartum Stable GBS and vaginal cultures   2. Insulin controlled gestational diabetes mellitus (GDM) in third trimester CBG's not in goal range. Taking  Metformin Will continue with Metformin Add NPH 10 units qhs Continue with serial growth scan and antenatal testing.  3. History of preterm delivery, currently pregnant Stable  4. Multigravida of advanced maternal age in third trimester Stable  5. Uterine leiomyoma, unspecified location Stable  6. Language barrier Live interpreter used during today's visit  7. History of VBAC Desires TOLAC  Preterm labor symptoms and general obstetric precautions including but not limited to vaginal bleeding, contractions, leaking of fluid and fetal movement were reviewed in detail with the patient. Please refer to After Visit Summary for other counseling recommendations.  Return in about 1 week (around 04/12/2021) for OB visit, face to face, MD only.   Chancy Milroy, MD

## 2021-04-05 NOTE — Patient Instructions (Signed)
Tercer trimestre de Media planner Third Trimester of Pregnancy El tercer trimestre de embarazo va desde la semana 28 hasta la semana 40. Tambin se dice que va desde el mes 7 hasta el mes 9. En este trimestre, el beb en gestacin (feto) crece muy rpidamente. Hacia el final del noveno mes, el beb en gestacin mide alrededor de 20 pulgadas (45 cm) de largo. Pesa entre 6 y 12 libras (2,70 y 4,50 kg). Cambios en el cuerpo durante el tercer trimestre Su organismo contina atravesando por muchos cambios durante este perodo. Los cambios varan y generalmente vuelven a la normalidad despus del nacimiento del beb. Cambios fsicos Seguir [employer]. Puede ser que aumente entre 25 y 62 libras (11 y 16 kg) hacia el final del Media planner. Si tiene [employer], puede aumentar entre 28 y 40 lb (unos 13 a 18 kg). Si tiene sobrepeso, puede aumentar entre 15 y 25 libras (unos 7 a 11 kg). Podrn aparecer las primeras estras en las caderas, el vientre (abdomen) y las [place]. [employer] seguirn creciendo y Tourist information centre manager. Un lquido [place] Public affairs consultant) puede salir de sus pechos. Esta es la primera leche que usted produce para el beb. Tal vez haya cambios en el cabello. El ombligo puede salir hacia afuera. Puede observar que se le hinchan ms las manos, la cara o los tobillos. Cambios en la salud Es posible que tenga acidez estomacal. Es posible que tenga dificultades para defecar (estreimiento). Pueden aparecerle hemorroides. Estas son venas hinchadas en el ano que pueden picar o doler. Puede comenzar a tener venas hinchadas (vrices) en las piernas. Puede presentar ms dolor en la pelvis, la espalda o los muslos. Puede presentar ms hormigueo o entumecimiento en las manos, los brazos y las piernas. La piel de su vientre tambin puede sentirse entumecida. Es posible que sienta falta de aire a medida que el tero se [place]. Otros cambios Es posible que haga pis (orine) con mayor frecuencia. Puede tener ms  problemas para dormir. Puede notar que el beb en gestacin baja o se mueve ms hacia bajo, en el vientre. Puede notar ms secrecin proveniente de la vagina. Puede sentir las articulaciones flojas y Agricultural consultant alrededor del hueso plvico. Siga estas instrucciones en su casa: Medicamentos Use los medicamentos de venta libre y los recetados solamente como se lo haya indicado el mdico. Algunos medicamentos no son seguros Solicitor. Tome vitaminas prenatales que contengan por lo menos 600 microgramos (mcg) de cido flico. Comida y bebida Consuma comidas saludables que incluyan lo siguiente: Lambert Mody y verduras frescas. Cereales integrales. Buenas fuentes de protenas, como carne, huevos y tofu. Productos lcteos con bajo contenido de [place]. Evite la carne cruda y el [place], la [place] y el queso sin Radio producer. Estos portan grmenes que pueden provocar dao tanto a usted como al beb. Tome 4 o 5 comidas pequeas en lugar de 3 comidas abundantes al da. Es posible que deba tomar medidas para prevenir o tratar los problemas para defecar: Electronics engineer suficiente lquido para Contractor pis (orina) de color amarillo plido. Come alimentos ricos en fibra. Entre ellos, frijoles, cereales integrales y frutas y verduras frescas. Limitar los alimentos con alto contenido de grasa y Location manager. Estos incluyen alimentos fritos o dulces. Actividad Haga ejercicios solamente como se lo haya indicado el mdico. Interrumpa la actividad fsica si comienza a tener clicos en el tero. Evite levantar pesos [employer]. No haga ejercicio si hace demasiado calor, hay demasiada humedad o se encuentra en un lugar de Sales executive (  altitud elevada). Si lo desea, puede continuar teniendo [employer], a menos que el mdico le indique lo contrario. Alivio del dolor y del malestar Haga pausas con frecuencia y descanse con las piernas levantadas (elevadas) si tiene calambres en las piernas o dolor en la parte  baja de la espalda. Dese baos de asiento con agua tibia para Best boy o las molestias causadas por las hemorroides. Use una crema para las hemorroides si el mdico la autoriza. Use un sostn que le brinde buen soporte si sus mamas estn sensibles. Si desarrolla venas hinchadas y abultadas en las piernas: Use medias de compresin segn las indicaciones de su mdico. Levante los pies durante 15 minutos, 3 o 4 veces por Training and development officer. Limite la sal en sus alimentos. Seguridad Hable con el mdico antes de Control and instrumentation engineer. No se d baos de inmersin en agua caliente, baos turcos ni saunas. Use el cinturn de seguridad en todo momento mientras vaya en auto. Hable con el mdico si alguien le est haciendo dao o gritando [place]. Preparacin para la llegada del beb Para prepararse para la llegada de su beb: Tome clases prenatales. Visite el hospital y recorra el rea de maternidad. Compre un asiento de seguridad [employer] atrs para llevar al beb en el automvil. Aprenda cmo instalarlo en el auto. Prepare la habitacin del beb. Saque todas las almohadas y los animales de peluche de la cuna del beb. Instrucciones generales Evite el contacto con las bandejas sanitarias de los gatos y la tierra que estos animales usan. Estos contienen grmenes que pueden daar al beb y causar la prdida del beb ya sea aborto espontneo o muerte fetal. No se haga duchas vaginales ni use tampones. No use tampones ni toallas higinicas perfumadas. No fume ni consuma ningn producto que contenga nicotina o tabaco. Si necesita ayuda para dejar de fumar, consulte al mdico. No beba alcohol. No use medicamentos a base de hierbas, drogas ilegales, ni medicamentos que el mdico no haya autorizado. Las sustancias qumicas de estos productos pueden afectar al beb. Cumpla con todas las visitas de seguimiento. Esto es importante. Dnde buscar ms informacin American Pregnancy Association (Asociacin  Americana del Embarazo): americanpregnancy.org [employer] of Obstetricians and Gynecologists (Colegio Estadounidense de Obstetras y Gineclogos): www.acog.org Office on [employer] ([place]): KeywordPortfolios.com.br Comunquese con un mdico si: Tiene fiebre. Tiene clicos leves o siente presin en la parte baja del vientre. Sufre un dolor persistente en el abdomen. Vomita o hace deposiciones acuosas (diarrea). Advierte lquido con mal olor que proviene de la vagina. Siente dolor al orinar o hace orina con mal olor. Tiene un dolor de cabeza que no desaparece despus de Teacher, adult education. Nota cambios en la visin o ve manchas delante de los ojos. Solicite ayuda de inmediato si: Rompe la bolsa. Tiene contracciones regulares separadas por menos de 5 minutos. Tiene sangrado o pequeas prdidas vaginales. Tiene clicos o dolor muy intensos en el vientre. Tiene dificultad para respirar. Sientes dolor en el pecho. Se desmaya. No ha sentido al beb moverse durante el tiempo que le indic el mdico. Tiene dolor, hinchazn o enrojecimiento nuevos en un brazo o una pierna o se produce un aumento de alguno de estos sntomas. Resumen El tercer trimestre comprende desde la semana 28 hasta la semana 40 (desde el mes 7 hasta el mes 9). Esta es la poca en que el beb en gestacin crece muy rpidamente. Durante este perodo, las molestias pueden aumentar a medida que usted sube  de peso y el beb crece. Preprese para la llegada del beb: asista a las clases prenatales, compre un asiento de seguridad orientado hacia atrs para llevar al beb en auto y prepare la habitacin del beb. Solicite ayuda de inmediato si tiene sangrado por la vagina, siente dolor en el pecho y tiene dificultad para respirar, o si no ha sentido al beb moverse durante el tiempo que le indic el mdico. Esta informacin no tiene Marine scientist el consejo del mdico. Asegrese de hacerle al  mdico cualquier pregunta que tenga. Document Revised: 08/11/2019 Document Reviewed: 08/11/2019 Elsevier Patient Education  [place].

## 2021-04-05 NOTE — Addendum Note (Signed)
Addended by: Annabell Howells on: 04/05/2021 03:37 PM   Modules accepted: Orders

## 2021-04-05 NOTE — Progress Notes (Signed)
Pt informed that the ultrasound is considered a limited OB ultrasound and is not intended to be a complete ultrasound exam.  Patient also informed that the ultrasound is not being completed with the intent of assessing for fetal or placental anomalies or any pelvic abnormalities.  Explained that the purpose of todays ultrasound is to assess for  BPP, presentation, and AFI.  Patient acknowledges the purpose of the exam and the limitations of the study.     Koren Bound RN BSN 04/05/21

## 2021-04-12 ENCOUNTER — Ambulatory Visit (INDEPENDENT_AMBULATORY_CARE_PROVIDER_SITE_OTHER): Payer: Self-pay | Admitting: Obstetrics and Gynecology

## 2021-04-12 ENCOUNTER — Encounter: Payer: No Typology Code available for payment source | Admitting: Advanced Practice Midwife

## 2021-04-12 ENCOUNTER — Other Ambulatory Visit: Payer: Self-pay

## 2021-04-12 ENCOUNTER — Other Ambulatory Visit (HOSPITAL_COMMUNITY)
Admission: RE | Admit: 2021-04-12 | Discharge: 2021-04-12 | Disposition: A | Discharge status: Home / Self Care | Payer: No Typology Code available for payment source | Source: Ambulatory Visit | Attending: Advanced Practice Midwife | Admitting: Advanced Practice Midwife

## 2021-04-12 ENCOUNTER — Ambulatory Visit (INDEPENDENT_AMBULATORY_CARE_PROVIDER_SITE_OTHER): Payer: Self-pay | Admitting: General Practice

## 2021-04-12 ENCOUNTER — Encounter: Payer: Self-pay | Admitting: Obstetrics and Gynecology

## 2021-04-12 ENCOUNTER — Ambulatory Visit (INDEPENDENT_AMBULATORY_CARE_PROVIDER_SITE_OTHER): Payer: Self-pay

## 2021-04-12 VITALS — BP 107/68 | HR 76 | Wt 179.7 lb

## 2021-04-12 DIAGNOSIS — O24415 Gestational diabetes mellitus in pregnancy, controlled by oral hypoglycemic drugs: Secondary | ICD-10-CM

## 2021-04-12 DIAGNOSIS — Z98891 History of uterine scar from previous surgery: Secondary | ICD-10-CM

## 2021-04-12 DIAGNOSIS — O09899 Supervision of other high risk pregnancies, unspecified trimester: Secondary | ICD-10-CM

## 2021-04-12 DIAGNOSIS — O09523 Supervision of elderly multigravida, third trimester: Secondary | ICD-10-CM

## 2021-04-12 DIAGNOSIS — O24414 Gestational diabetes mellitus in pregnancy, insulin controlled: Secondary | ICD-10-CM

## 2021-04-12 DIAGNOSIS — O0990 Supervision of high risk pregnancy, unspecified, unspecified trimester: Secondary | ICD-10-CM | POA: Insufficient documentation

## 2021-04-12 LAB — OB RESULTS CONSOLE GC/CHLAMYDIA: Gonorrhea: NEGATIVE

## 2021-04-12 NOTE — Progress Notes (Signed)
Subjective:  ?Vanessa Henson is a 36 y.o. [phone] at [redacted]w[redacted]d being seen today for ongoing prenatal care.  She is currently monitored for the following issues for this high-risk pregnancy and has History of gestational diabetes; History of VBAC; Language barrier, cultural differences; Obesity; Seasonal allergies; Elevated LDL cholesterol level; Supervision of high risk pregnancy, antepartum; Fibroid, uterine; AMA (advanced maternal age) multigravida 35+; History of preterm delivery, currently pregnant; Language barrier; and GDM (gestational diabetes mellitus) on their problem list. ? ?Patient reports general discomforts of pregnancy.  Contractions: Irritability. Vag. Bleeding: None.  Movement: Present. Denies leaking of fluid.  ? ?The following portions of the patient's history were reviewed and updated as appropriate: allergies, current medications, past family history, past medical history, past social history, past surgical history and problem list. Problem list updated. ? ?Objective:  ? ?Vitals:  ? 04/12/21 1043  ?BP: 107/68  ?Pulse: 76  ?Weight: 179 lb 11.2 oz (81.5 kg)  ? ? ?Fetal Status: Fetal Heart Rate (bpm): 150   Movement: Present    ? ?General:  Alert, oriented and cooperative. Patient is in no acute distress.  ?Skin: Skin is warm and dry. No rash noted.   ?Cardiovascular: Normal heart rate noted  ?Respiratory: Normal respiratory effort, no problems with respiration noted  ?Abdomen: Soft, gravid, appropriate for gestational age. Pain/Pressure: Present     ?Pelvic:  Cervical exam performed        ?Extremities: Normal range of motion.  Edema: None  ?Mental Status: Normal mood and affect. Normal behavior. Normal judgment and thought content.  ? ?Urinalysis:     ? ?Assessment and Plan:  ?Pregnancy: [id number] at [redacted]w[redacted]d ? ?1. Supervision of high risk pregnancy, antepartum ?Labor precautions ?- GC/Chlamydia probe amp ([place])not at [facility] ?- Culture, beta strep (group b only) ? ?2. Insulin controlled  gestational diabetes mellitus (GDM) in third trimester ?Did not bring in CBG log but reports CBG's are in goal range ?Did not pick up insulin. ?Has been walking 1 hr a day and drinking more water ?Will hold on insulin for now ?Continue with weekly antenatal testing and serial growth scans as per protocol ? ?3. Multigravida of advanced maternal age in third trimester ?Stable ? ? ?4. History of preterm delivery, currently pregnant ?Will discontinue progesterone ? ? ?5. History of VBAC ?Desires TOLAC ?Consented ? ?Preterm labor symptoms and general obstetric precautions including but not limited to vaginal bleeding, contractions, leaking of fluid and fetal movement were reviewed in detail with the patient. ?Please refer to After Visit Summary for other counseling recommendations.  ?Return in about 1 week (around 04/19/2021) for OB visit, face to face, MD only. ? ? ?Chancy Milroy, MD ?

## 2021-04-12 NOTE — Progress Notes (Signed)
Pt informed that the ultrasound is considered a limited OB ultrasound and is not intended to be a complete ultrasound exam.  Patient also informed that the ultrasound is not being completed with the intent of assessing for fetal or placental anomalies or any pelvic abnormalities.  Explained that the purpose of today?s ultrasound is to assess for  BPP, presentation, and AFI.  Patient acknowledges the purpose of the exam and the limitations of the study.    ? ?Koren Bound RN BSN ?04/12/21 ? ?

## 2021-04-12 NOTE — Progress Notes (Signed)
Requesting refill on insulin, states it was to be sent in but pharmacy has not received ? ? ?Fasting sugars 90's ?2 hour 110-120's ?Dinner 110-120's ? ?Altha Harm, CMA ? ?

## 2021-04-12 NOTE — Patient Instructions (Signed)
Parto vaginal ?Vaginal Delivery ?Parto vaginal significa que usted da a luz empujando al beb? fuera del canal del parto (vagina). Su equipo de atenci?n m?dica la ayudar? antes, durante y despu?s del parto vaginal. ?Las experiencias de los nacimientos son ?nicas para todas las [place], y Engineer, technical sales y las experiencias de nacimiento var?an seg?n d?nde elija dar a Actuary. ??Cu?les son los riesgos y beneficios? ?En general, esto es seguro. Sin embargo, pueden ocurrir complicaciones, por ejemplo: ?Sangrado. ?Infecci?n. ?Da?o a otras estructuras, como desgarro vaginal. ?Reacciones al?rgicas a los medicamentos. ?A pesar de los riesgos, los beneficios del parto vaginal incluyen un menor riesgo de sangrado e infecci?n y un menor tiempo de recuperaci?n en comparaci?n con el parto por ces?rea. El parto por ces?rea es el parto del beb? por medios quir?rgicos. ??Qu? ocurrir? cuando llegue al centro de parto o al hospital? ?Una vez que se inicie el [place] de parto y haya sido admitida en el hospital o centro de parto, su equipo de atenci?n m?dica podr? hacer lo siguiente: ?Revisar sus antecedentes de Media planner y cualquier inquietud que usted pueda tener. ?Hablar con usted sobre su plan de nacimiento y [place] opciones para Financial controller. ?Controlarle la presi?n arterial, la respiraci?n y los latidos card?acos. ?Evaluar los latidos card?acos del beb?Marland Kitchen ?Monitorear el ?tero para TEFL teacher. ?Verificar si la bolsa de agua (saco amni?tico) se ha roto (ruptura). ?Colocarle una v?a intravenosa en una de las venas. Esto se podr? usar para administrarle l?quidos y medicamentos. ?Monitoreo ?Su equipo de atenci?n m?dica puede evaluar las contracciones (monitoreo uterino) y la frecuencia card?aca del beb? (monitoreo fetal). Es posible que el monitoreo se necesite realizar: ?Con frecuencia, pero no continuamente (intermitentemente). ?Todo el tiempo o durante largos per?odos a la vez (continuamente). El monitoreo continuo  puede ser necesario si: ?Est? recibiendo determinados medicamentos, tales como medicamentos para Best boy o para hacer que las contracciones sean m?s fuertes. ?Tiene complicaciones durante el embarazo o el [place] de [place]. ?El monitoreo se puede realizar: ?Al colocar un estetoscopio especial o un dispositivo manual de monitoreo en el abdomen o verificar los latidos card?acos del beb? y Programmer, multimedia las contracciones. ?Al colocar monitores en el abdomen (monitores externos) para Museum/gallery curator los latidos card?acos del beb? y la frecuencia y [place] contracciones. ?Al colocar monitores dentro del ?tero a trav?s de la vagina (monitores internos) para Museum/gallery curator los latidos card?acos del beb? y Scientist, forensic, duraci?n y fuerza de sus contracciones. Seg?n el tipo de monitor, International aid/development worker en el ?tero o en la cabeza del beb? hasta el nacimiento. ?Telemetr?a. Se trata de un tipo de monitoreo continuo que se puede Optometrist con monitores externos o internos. En lugar de Passenger transport manager en la cama, usted puede desplazarse. ?Examen f?sico ?Su equipo de atenci?n m?dica puede realizar ex?menes f?sicos frecuentes. Esto puede incluir: ?Verificar c?mo y d?nde el beb? est? ubicado en el ?tero. ?Verificar el cuello uterino para determinar: ?Si se est? afinando o estirando (borrando). ?Si se est? abriendo (dilatando). ??Qu? [place] y [place]? ?El [place] de parto y el parto normales se dividen en tres etapas: ?Etapa 1 ?Esta es la etapa m?s larga del [place] de [place]. ?Durante esta etapa, sentir? contracciones. En general, las contracciones son leves, infrecuentes e irregulares al principio. Se hacen m?s fuertes, m?s frecuentes y m?s regulares a medida que avanza en esta etapa. Puede tener contracciones cada 2 o 3 minutos. ?Esta etapa finaliza cuando el cuello uterino est? completamente dilatado hasta  4 pulgadas (10 cm) y completamente borrado. ?Etapa 2 ?Esta etapa comienza una vez que el  cuello uterino est? totalmente borrado y dilatado, y dura hasta el nacimiento del beb?Marland Kitchen ?Esta es la etapa en la que va a sentir ganas de pujar al beb? fuera de la vagina. ?Puede sentir un dolor urente y por estiramiento, especialmente cuando la parte m?s ancha de la cabeza del beb? pasa a trav?s de la abertura vaginal (coronaci?n). ?Una vez que el beb? nace, el cord?n umbilical se pinzar? y se cortar?. El momento de cortar el cord?n depender? de sus deseos, de la salud del beb? y de los m?todos del m?dico. ?Colocar?n al beb? sobre su pecho desnudo (contacto piel con piel) en una posici?n erguida y [place] con Josefine Class abrigada. Si optar? por amamantar, observe al beb? para detectar se?ales de hambre, como el reflejo de b?squeda o succi?n, y ac?rquelo al pecho para su primera alimentaci?n. ?Etapa 3 ?Esta etapa comienza inmediatamente despu?s del nacimiento del beb? y finaliza despu?s de la expulsi?n de la placenta. ?Esta etapa puede durar de 5 a 30 minutos. ?Despu?s del nacimiento del beb?, puede sentir contracciones cuando el cuerpo expulsa la placenta. Estas contracciones tambi?n ayudan a que el ?tero reduzca su tama?o y a Engineer, maintenance. ??Qu? puedo esperar despu?s del [place] de parto y el parto? ?Una vez que termina el [place] de [place], se los evaluar? a usted y al beb? atentamente hasta que est?n listos para ir a casa. Su equipo de atenci?n m?dica le ense?ar? c?mo cuidarse y cuidar a su beb?Marland Kitchen ?Le recomendar?n que usted y el beb? permanezcan en la misma sala (cohabitaci?n) durante su estad?a en el hospital. Esto ayudar? a fomentar un v?nculo temprano y Transport planner. ?Se le controlar? y Community education officer? el ?tero con regularidad (masaje f?ndico). ?Puede seguir recibiendo l?quidos o medicamentos por v?a intravenosa. ?Tendr? algo de inflamaci?n y Social research officer, government en el abdomen, la vagina y la zona de la piel entre la abertura vaginal y el ano (perineo). ?Si se le realiz? una incisi?n cerca de la vagina (episiotom?a)  o si ha tenido alg?n desgarro vaginal durante el parto, podr?an indicarle que se coloque compresas fr?as sobre la episiotom?a o Psychiatrist. Esto ayuda a Best boy y la hinchaz?n. ?Es normal tener hemorragia vaginal despu?s del [place]. Use un ap?sito sanitario para el sangrado vaginal y secreci?n. ?Resumen ?Parto vaginal significa que usted dar? a luz empujando al beb? fuera del canal del parto (vagina). ?Su equipo de atenci?n m?dica lo controlar? a usted y al beb? durante las etapas del [place]. ?Despu?s del parto, su equipo de atenci?n m?dica continuar? evalu?ndolos a usted y al beb? para asegurarse de que ambos se est?n recuperando seg?n lo esperado despu?s del parto. ?Esta informaci?n no tiene Marine scientist el consejo del m?dico. Aseg?rese de hacerle al m?dico cualquier pregunta que tenga. ?Document Revised: 01/04/2020 Document Reviewed: 01/04/2020 ?Elsevier Patient Education ? [place]. ? ?

## 2021-04-13 LAB — GC/CHLAMYDIA PROBE AMP (~~LOC~~) NOT AT ARMC
Chlamydia: NEGATIVE
Comment: NEGATIVE
Comment: NORMAL
Neisseria Gonorrhea: NEGATIVE

## 2021-04-16 LAB — CULTURE, BETA STREP (GROUP B ONLY): Strep Gp B Culture: NEGATIVE

## 2021-04-17 ENCOUNTER — Encounter: Payer: No Typology Code available for payment source | Attending: Family Medicine | Admitting: Registered"

## 2021-04-17 ENCOUNTER — Other Ambulatory Visit: Payer: Self-pay

## 2021-04-17 ENCOUNTER — Other Ambulatory Visit: Payer: Self-pay | Admitting: Family Medicine

## 2021-04-17 ENCOUNTER — Ambulatory Visit (INDEPENDENT_AMBULATORY_CARE_PROVIDER_SITE_OTHER): Payer: Self-pay | Admitting: Registered"

## 2021-04-17 DIAGNOSIS — O24419 Gestational diabetes mellitus in pregnancy, unspecified control: Secondary | ICD-10-CM | POA: Insufficient documentation

## 2021-04-17 DIAGNOSIS — Z3A00 Weeks of gestation of pregnancy not specified: Secondary | ICD-10-CM | POA: Insufficient documentation

## 2021-04-17 DIAGNOSIS — O09529 Supervision of elderly multigravida, unspecified trimester: Secondary | ICD-10-CM | POA: Insufficient documentation

## 2021-04-17 DIAGNOSIS — O24415 Gestational diabetes mellitus in pregnancy, controlled by oral hypoglycemic drugs: Secondary | ICD-10-CM

## 2021-04-17 MED ORDER — METFORMIN HCL 1000 MG PO TABS
1000.0000 mg | ORAL_TABLET | Freq: Two times a day (BID) | ORAL | 5 refills | Status: DC
Start: 2021-04-17 — End: 2021-05-01

## 2021-04-17 NOTE — Progress Notes (Signed)
Increase metformin dose ?

## 2021-04-17 NOTE — Progress Notes (Signed)
In-person interpreter [name] from [facility] ? ?Patient was seen for Gestational Diabetes self-management on 04/17/2021 ?Start time 1425 pm and End time 1450 pm  ? ?Estimated due date: 05/08/21; [redacted]w[redacted]d? ?Clinical: ?Medications: Metformin 500 mg bid ?Medical History: reviewed ?Labs: OGTT [id number], A1c n/a%  ? ? ? ?Dietary and Lifestyle History: ?Pt states she has reduced her blood sugar with some dietary changes and additional exercise. ? ?Physical Activity: 7x/week 60 min 2x/day ?Stress: not assessed ?Sleep: 10p-6 am, but disturbed a lot by having to urinate during the night.  ? ?24 hr Recall:  ?B: 1 slice of bread, avocado, 1 egg, coffee with 2% milk 1/2 tsp sugar ?S: granola bar or fruit ?L: chicken fajita (no tortilla), 1/2 c rice, water ?S: fruit ?D: vegetables, small amount of rice, (last night had sweetened hibiscus tea - 139 cbg) ?S: none ?Bev: water, coffee, milk ? ?NUTRITION INTERVENTION  ?Nutrition education (E-1) on the following topics:  ? ?Initial Follow-up ? ?'[]'$  '[]'$  Definition of Gestational Diabetes ?'[]'$  '[]'$  Why dietary management is important in controlling blood glucose ?'[x]'$  '[]'$  Effects each nutrient has on blood glucose levels ?'[]'$  '[]'$  Simple carbohydrates vs complex carbohydrates ?'[]'$  '[]'$  Fluid intake ?'[x]'$  '[x]'$  Creating a balanced meal plan (and snacks) ?'[x]'$  '[]'$  Carbohydrate counting  ?'[x]'$  '[]'$  When to check blood glucose levels ?'[x]'$  '[]'$  Proper blood glucose monitoring techniques ?'[x]'$  '[]'$  Effect of stress and stress reduction techniques  ?'[x]'$  '[x]'$  Exercise effect on blood glucose levels, appropriate exercise during pregnancy ?'[]'$  '[]'$  Importance of limiting caffeine and abstaining from alcohol and smoking ?'[]'$  '[x]'$  Medications used for blood sugar control during pregnancy ?'[]'$  '[]'$  Hypoglycemia and rule of 15 ?'[x]'$  '[]'$  Postpartum self care ? ?Patient instructed to monitor glucose levels: ?FBS: 60 - ? 95 mg/dL (some clinics use 90 for cutoff) ?1 hour: ? 140 mg/dL ?2 hour: ? 120 mg/dL ? ?Patient received  handouts:none ? ?Plan: ?Per Dr. EDione Ploverincrease metformin to 1000 mg/bid ? ?Patient agrees with plan and no further questions ? ?Patient will be seen for follow-up as needed.  ?

## 2021-04-19 ENCOUNTER — Other Ambulatory Visit: Payer: Self-pay

## 2021-04-19 ENCOUNTER — Encounter: Payer: Self-pay | Admitting: *Deleted

## 2021-04-19 ENCOUNTER — Ambulatory Visit (INDEPENDENT_AMBULATORY_CARE_PROVIDER_SITE_OTHER): Payer: Self-pay | Admitting: Obstetrics and Gynecology

## 2021-04-19 ENCOUNTER — Ambulatory Visit: Payer: No Typology Code available for payment source | Admitting: *Deleted

## 2021-04-19 ENCOUNTER — Other Ambulatory Visit: Payer: No Typology Code available for payment source

## 2021-04-19 ENCOUNTER — Ambulatory Visit: Payer: Self-pay | Attending: Obstetrics

## 2021-04-19 ENCOUNTER — Encounter: Payer: Self-pay | Admitting: Obstetrics and Gynecology

## 2021-04-19 VITALS — BP 100/45 | HR 70

## 2021-04-19 VITALS — BP 120/70 | HR 67 | Wt 187.0 lb

## 2021-04-19 DIAGNOSIS — Z789 Other specified health status: Secondary | ICD-10-CM

## 2021-04-19 DIAGNOSIS — O09899 Supervision of other high risk pregnancies, unspecified trimester: Secondary | ICD-10-CM

## 2021-04-19 DIAGNOSIS — D259 Leiomyoma of uterus, unspecified: Secondary | ICD-10-CM | POA: Insufficient documentation

## 2021-04-19 DIAGNOSIS — O09523 Supervision of elderly multigravida, third trimester: Secondary | ICD-10-CM | POA: Insufficient documentation

## 2021-04-19 DIAGNOSIS — O34219 Maternal care for unspecified type scar from previous cesarean delivery: Secondary | ICD-10-CM | POA: Insufficient documentation

## 2021-04-19 DIAGNOSIS — O09213 Supervision of pregnancy with history of pre-term labor, third trimester: Secondary | ICD-10-CM

## 2021-04-19 DIAGNOSIS — O24410 Gestational diabetes mellitus in pregnancy, diet controlled: Secondary | ICD-10-CM | POA: Insufficient documentation

## 2021-04-19 DIAGNOSIS — O09293 Supervision of pregnancy with other poor reproductive or obstetric history, third trimester: Secondary | ICD-10-CM

## 2021-04-19 DIAGNOSIS — O0990 Supervision of high risk pregnancy, unspecified, unspecified trimester: Secondary | ICD-10-CM

## 2021-04-19 DIAGNOSIS — Z98891 History of uterine scar from previous surgery: Secondary | ICD-10-CM

## 2021-04-19 DIAGNOSIS — Z3A37 37 weeks gestation of pregnancy: Secondary | ICD-10-CM

## 2021-04-19 DIAGNOSIS — Z3009 Encounter for other general counseling and advice on contraception: Secondary | ICD-10-CM

## 2021-04-19 DIAGNOSIS — Z603 Acculturation difficulty: Secondary | ICD-10-CM

## 2021-04-19 DIAGNOSIS — O24415 Gestational diabetes mellitus in pregnancy, controlled by oral hypoglycemic drugs: Secondary | ICD-10-CM

## 2021-04-19 NOTE — Patient Instructions (Signed)
Vaginal Delivery ?Vaginal delivery means that you give birth by pushing your baby out of your birth canal (vagina). Your health care team will help you before, during, and after vaginal delivery. ?Birth experiences are unique for every woman and every pregnancy, and birth experiences vary depending on where you choose to give birth. ?What are the risks and benefits? ?Generally, this is safe. However, problems may occur, including: ?Bleeding. ?Infection. ?Damage to other structures such as vaginal tearing. ?Allergic reactions to medicines. ?Despite the risks, benefits of vaginal delivery include less risk of bleeding and infection and a shorter recovery time compared to a Cesarean delivery. Cesarean delivery, or C-section, is the surgical delivery of a baby. ?What happens when I arrive at the birth center or hospital? ?Once you are in labor and have been admitted into the hospital or birth center, your health care team may: ?Review your pregnancy history and any concerns that you have. ?Talk with you about your birth plan and discuss pain control options. ?Check your blood pressure, breathing, and heartbeat. ?Assess your baby's heartbeat. ?Monitor your uterus for contractions. ?Check whether your bag of water (amniotic sac) has broken (ruptured). ?Insert an IV into one of your veins. This may be used to give you fluids and medicines. ?Monitoring ?Your health care team may assess your contractions (uterine monitoring) and your baby's heart rate (fetal monitoring). You may need to be monitored: ?Often, but not continuously (intermittently). ?All the time or for long periods at a time (continuously). Continuous monitoring may be needed if: ?You are taking certain medicines, such as medicine to relieve pain or make your contractions stronger. ?You have pregnancy or labor complications. ?Monitoring may be done by: ?Placing a special stethoscope or a handheld monitoring device on your abdomen to check your baby's heartbeat  and to check for contractions. ?Placing monitors on your abdomen (external monitors) to record your baby's heartbeat and the frequency and length of contractions. ?Placing monitors inside your uterus through your vagina (internal monitors) to record your baby's heartbeat and the frequency, length, and strength of your contractions. Depending on the type of monitor, it may remain in your uterus or on your baby's head until birth. ?Telemetry. This is a type of continuous monitoring that can be done with external or internal monitors. Instead of having to stay in bed, you are able to move around. ?Physical exam ?Your health care team may perform frequent physical exams. This may include: ?Checking how and where your baby is positioned in your uterus. ?Checking your cervix to determine: ?Whether it is thinning out (effacing). ?Whether it is opening up (dilating). ?What happens during labor and delivery? ?Normal labor and delivery is divided into the following three stages: ?Stage 1 ?This is the longest stage of labor. ?Throughout this stage, you will feel contractions. Contractions generally feel mild, infrequent, and irregular at first. They get stronger, more frequent, and more regular as you move through this stage. You may have contractions about every 2-3 minutes. ?This stage ends when your cervix is completely dilated to 4 inches (10 cm) and completely effaced. ?Stage 2 ?This stage starts once your cervix is completely effaced and dilated and lasts until the delivery of your baby. ?This is the stage where you will feel an urge to push your baby out of your vagina. ?You may feel stretching and burning pain, especially when the widest part of your baby's head passes through the vaginal opening (crowning). ?Once your baby is delivered, the umbilical cord will be clamped and  cut. Timing of cutting the cord will depend on your wishes, your baby's health, and your health care provider's practices. ?Your baby will be  placed on your bare chest (skin-to-skin contact) in an upright position and covered with a warm blanket. If you are choosing to breastfeed, watch your baby for feeding cues, like rooting or sucking, and help the baby to your breast for his or her first feeding. ?Stage 3 ?This stage starts immediately after the birth of your baby and ends after you deliver the placenta. ?This stage may take anywhere from 5 to 30 minutes. ?After your baby has been delivered, you will feel contractions as your body expels the placenta. These contractions also help your uterus get smaller and reduce bleeding. ?What can I expect after labor and delivery? ?After labor is over, you and your baby will be assessed closely until you are ready to go home. Your health care team will teach you how to care for yourself and your baby. ?You and your baby may be encouraged to stay in the same room (rooming in) during your hospital stay. This will help promote early bonding and successful breastfeeding. ?Your uterus will be checked and massaged regularly (fundal massage). ?You may continue to receive fluids and medicines through an IV. ?You will have some soreness and pain in your abdomen, vagina, and the area of skin between your vaginal opening and your anus (perineum). ?If an incision was made near your vagina (episiotomy) or if you had some vaginal tearing during delivery, cold compresses may be placed on your episiotomy or your tear. This helps to reduce pain and swelling. ?It is normal to have vaginal bleeding after delivery. Wear a sanitary pad for vaginal bleeding and discharge. ?Summary ?Vaginal delivery means that you will give birth by pushing your baby out of your birth canal (vagina). ?Your health care team will monitor you and your baby throughout the stages of labor. ?After you deliver your baby, your health care team will continue to assess you and your baby to ensure you are both recovering as expected after delivery. ?This  information is not intended to replace advice given to you by your health care provider. Make sure you discuss any questions you have with your health care provider. ?Document Revised: 12/27/2019 Document Reviewed: 12/27/2019 ?Elsevier Patient Education ? [place]. ? ?

## 2021-04-19 NOTE — Progress Notes (Signed)
Subjective:  ?Vanessa Henson is a 36 y.o. [phone] at 26w2dbeing seen today for ongoing prenatal care.  She is currently monitored for the following issues for this high-risk pregnancy and has History of gestational diabetes; History of VBAC; Language barrier, cultural differences; Obesity; Seasonal allergies; Elevated LDL cholesterol level; Supervision of high risk pregnancy, antepartum; Fibroid, uterine; AMA (advanced maternal age) multigravida 35+; History of preterm delivery, currently pregnant; Language barrier; GDM (gestational diabetes mellitus); and Unwanted fertility on their problem list. ? ?Patient reports general discomforts of pregnancy.  Contractions: Irritability. Vag. Bleeding: None.  Movement: Present. Denies leaking of fluid.  ? ?The following portions of the patient's history were reviewed and updated as appropriate: allergies, current medications, past family history, past medical history, past social history, past surgical history and problem list. Problem list updated. ? ?Objective:  ? ?Vitals:  ? 04/19/21 1006  ?BP: 120/70  ?Pulse: 67  ?Weight: 187 lb (84.8 kg)  ? ? ?Fetal Status: Fetal Heart Rate (bpm): 158   Movement: Present    ? ?General:  Alert, oriented and cooperative. Patient is in no acute distress.  ?Skin: Skin is warm and dry. No rash noted.   ?Cardiovascular: Normal heart rate noted  ?Respiratory: Normal respiratory effort, no problems with respiration noted  ?Abdomen: Soft, gravid, appropriate for gestational age. Pain/Pressure: Present     ?Pelvic:  Cervical exam deferred        ?Extremities: Normal range of motion.  Edema: Trace  ?Mental Status: Normal mood and affect. Normal behavior. Normal judgment and thought content.  ? ?Urinalysis:     ? ?Assessment and Plan:  ?Pregnancy: [id number] 357w2d ?1. Supervision of high risk pregnancy, antepartum ?Stable ?Labor precautions ? ?2. Gestational diabetes mellitus (GDM) in third trimester controlled on oral hypoglycemic  drug ?Reports CBG's in goal range. Continue with current treatment ?Growth scan today. Weekly antenatal testing ?Pt reports MFM recommended IOL at 38 weeks per today's U/S. But final report not available ? ? ?3. Multigravida of advanced maternal age in third trimester ?Stable ? ?4. History of preterm delivery, currently pregnant ?Term now ? ?5. History of VBAC ?For TOLAC ? ?6. Language barrier ?Live interrupter used during today's visit ? ?7. Unwanted fertility ?Self pay. Pt aware to bring money for procedure ? ? ?Term labor symptoms and general obstetric precautions including but not limited to vaginal bleeding, contractions, leaking of fluid and fetal movement were reviewed in detail with the patient. ?Please refer to After Visit Summary for other counseling recommendations.  ?Return in about 1 week (around 04/26/2021) for OB visit, face to face, MD only. ? ? ?ErChancy MilroyMD ?

## 2021-04-25 NOTE — Progress Notes (Signed)
Patient came into front office to speak with someone regarding new symptoms. Spoke with patient with [facility] interpreter [name]. Patient states she was awake most of the night with contractions. States they were 3 minutes apart for a while, but then stopped from 5-7 AM. At 7 AM contractions resumed at 10 minutes apart. Patient denies decreased fetal movement, vaginal bleeding, or leaking fluid. Encouraged pt to monitor contractions and present to MAU if these become 5 minutes apart. Explained patient may be in labor, but it is still okay for her to be at home. If contractions go away, pt should return for appt tomorrow. Encouraged good hydration in meantime.  ?

## 2021-04-26 ENCOUNTER — Ambulatory Visit (INDEPENDENT_AMBULATORY_CARE_PROVIDER_SITE_OTHER): Payer: Self-pay | Admitting: Family Medicine

## 2021-04-26 ENCOUNTER — Other Ambulatory Visit: Payer: Self-pay

## 2021-04-26 ENCOUNTER — Ambulatory Visit: Payer: No Typology Code available for payment source | Admitting: *Deleted

## 2021-04-26 ENCOUNTER — Ambulatory Visit (INDEPENDENT_AMBULATORY_CARE_PROVIDER_SITE_OTHER): Payer: Self-pay

## 2021-04-26 VITALS — BP 113/67 | HR 79 | Wt 187.9 lb

## 2021-04-26 DIAGNOSIS — Z603 Acculturation difficulty: Secondary | ICD-10-CM

## 2021-04-26 DIAGNOSIS — Z98891 History of uterine scar from previous surgery: Secondary | ICD-10-CM

## 2021-04-26 DIAGNOSIS — O09523 Supervision of elderly multigravida, third trimester: Secondary | ICD-10-CM

## 2021-04-26 DIAGNOSIS — O0990 Supervision of high risk pregnancy, unspecified, unspecified trimester: Secondary | ICD-10-CM

## 2021-04-26 DIAGNOSIS — O24415 Gestational diabetes mellitus in pregnancy, controlled by oral hypoglycemic drugs: Secondary | ICD-10-CM

## 2021-04-26 DIAGNOSIS — O09899 Supervision of other high risk pregnancies, unspecified trimester: Secondary | ICD-10-CM

## 2021-04-26 NOTE — Progress Notes (Signed)
? ?  PRENATAL VISIT NOTE ? ?Subjective:  ?Vanessa Henson is a 36 y.o. [phone] at 56w2dbeing seen today for ongoing prenatal care.  She is currently monitored for the following issues for this high-risk pregnancy and has History of gestational diabetes; History of VBAC; Language barrier, cultural differences; Obesity; Seasonal allergies; Elevated LDL cholesterol level; Supervision of high risk pregnancy, antepartum; Fibroid, uterine; AMA (advanced maternal age) multigravida 35+; History of preterm delivery, currently pregnant; GDM (gestational diabetes mellitus); and Unwanted fertility on their problem list. ? ?Patient reports no complaints.  Contractions: Irritability. Vag. Bleeding: None.  Movement: Present. Denies leaking of fluid.  ? ?The following portions of the patient's history were reviewed and updated as appropriate: allergies, current medications, past family history, past medical history, past social history, past surgical history and problem list.  ? ?Objective:  ? ?Vitals:  ? 04/26/21 0834  ?BP: 113/67  ?Pulse: 79  ?Weight: 187 lb 14.4 oz (85.2 kg)  ? ? ?Fetal Status: Fetal Heart Rate (bpm): 144   Movement: Present    ? ?General:  Alert, oriented and cooperative. Patient is in no acute distress.  ?Skin: Skin is warm and dry. No rash noted.   ?Cardiovascular: Normal heart rate noted  ?Respiratory: Normal respiratory effort, no problems with respiration noted  ?Abdomen: Soft, gravid, appropriate for gestational age.  Pain/Pressure: Present     ?Pelvic: Cervical exam deferred        ?Extremities: Normal range of motion.  Edema: Trace  ?Mental Status: Normal mood and affect. Normal behavior. Normal judgment and thought content.  ?NST:  Baseline: 150 bpm, Variability: Good {> 6 bpm), Accelerations: Reactive, and Decelerations: Absent ? ?Assessment and Plan:  ?Pregnancy: [id number] 328w2d1. Gestational diabetes mellitus (GDM) in third trimester controlled on oral hypoglycemic drug ? ? ?See CBGs on  metformin--well controlled but has LGA and poly--for IOL ASAP ?2. Language barrier, cultural differences ?Spanish interpreter: [name] used ? ? ?3. Supervision of high risk pregnancy, antepartum ?GBS negative ? ?4. Multigravida of advanced maternal age in third trimester ?Low risk NIPT ? ?5. History of preterm delivery, currently pregnant ?At term ? ?6. History of VBAC ?For IOL with prior c-section, no cytotec ? ?Term labor symptoms and general obstetric precautions including but not limited to vaginal bleeding, contractions, leaking of fluid and fetal movement were reviewed in detail with the patient. ?Please refer to After Visit Summary for other counseling recommendations.  ? ?Return in 4 weeks (on 05/24/2021) for pp check. ? ?Future Appointments  ?Date Time Provider [place]?04/26/2021 10:15 AM [facility] US1 [facility]  ?04/28/2021  7:00 AM [facility] SCHED ROOM [facility] None  ? ? ?TaDonnamae JudeMD ? ?

## 2021-04-26 NOTE — Patient Instructions (Signed)
Diabetes mellitus gestacional, diagn?stico ?Gestational Diabetes Mellitus, Diagnosis ?La diabetes mellitus gestacional, o diabetes gestacional, es una forma de diabetes que algunas mujeres desarrollan durante el [place]. Para controlar el nivel de az?car en la sangre (glucosa) en el cuerpo, el p?ncreas produce una hormona llamada insulina. Esta hormona permite el ingreso de la glucosa en las c?lulas del cuerpo. Las c?lulas usan la glucosa para obtener energ?a. Sin embargo, en algunas mujeres embarazadas, es posible que el p?ncreas no produzca la cantidad suficiente de insulina o que cuerpo no utilice adecuadamente la insulina disponible. Esto causa diabetes gestacional. ?La diabetes gestacional solo dura un corto tiempo. Generalmente, sucede alrededor de la semana 24?a la 28?de gestaci?n y desaparece despu?s del parto. Sin embargo, las mujeres que sufren diabetes gestacional son m?s propensas a lo siguiente: ?Location manager a desarrollarla nuevamente si quedan embarazadas. ?Desarrollar diabetes tipo?2 en el futuro. ?Si la diabetes gestacional se trata, no es probable que [employer]. Si esta afecci?n no se controla con tratamiento, puede ocasionar problemas durante el [place] de parto y [place]. [place]?ar al beb? y a Therapist, nutritional. ??Cu?les son las causas? ?Esta afecci?n se manifiesta durante el embarazo, cuando el cuerpo de la [place] produce hormonas de crecimiento que ayudan al beb? a desarrollarse. A veces, estas hormonas: ?Hacen que el p?ncreas trabaje m?s de lo normal para producir m?s insulina. A veces, el p?ncreas igualmente no puede producir suficiente insulina. ?Causan resistencia a la insulina. Esto hace que sea dif?cil para las c?lulas utilizar la insulina correctamente. ?La resistencia a la insulina o la falta de insulina hacen que el exceso de glucosa se acumule en la sangre, en lugar de ir a las c?lulas. Esto deriva en glucemia alta (hiperglucemia). ??Qu? incrementa el riesgo? ?Es m?s  probable que esta afecci?n se manifieste en las mujeres embarazadas que: ?Son [place] de 25?a?os durante el embarazo. ?Tienen antecedentes familiares de diabetes. ?Tienen sobrepeso. ?Han tenido diabetes gestacional en el pasado. ?Tienen el s?ndrome del ovario poliqu?stico (SOP). ?Est?n embarazadas de m?s de un beb?Marland Kitchen ??Cu?les son los signos o s?ntomas? ?Los s?ntomas frecuentes de esta afecci?n incluyen los siguientes: ?Aumento de la sed. ?Aumento del apetito. ?Necesidad de Garment/textile technologist con m?s frecuencia. ?La mayor?a de las mujeres no perciben estos s?ntomas porque son similares a otros s?ntomas del Media planner. ??C?mo se diagnostica? ?Esta afecci?n puede diagnosticarse en funci?n del nivel de glucemia. Este puede revisarse: ?Despu?s de ayunar durante 8 horas o m?s tiempo (prueba de glucemia en ayunas). ?En cualquier momento del d?a, sin importar cu?ndo comi? (prueba de glucosa al azar). ?En las semanas 24 a 28 del embarazo, para Neurosurgeon c?mo responde el cuerpo a la glucosa (prueba oral de tolerancia a la glucosa). ?Si tiene factores de riesgo, pueden hacerle pruebas de detecci?n de la diabetes tipo?2 en la primera visita de atenci?n m?dica durante el embarazo. ??C?mo se trata? ?El tratamiento de esta afecci?n depende de la etapa del embarazo y de cualquier otra afecci?n que pudiera tener. El tratamiento puede incluir: ?Consumir una dieta saludable y aumentar la actividad f?sica. Estas son las formas m?s importantes de Theatre manager la diabetes gestacional bajo control. ?Controlarse la glucemia con la frecuencia que le hayan indicado. ?Aplicarse insulina o tomar otros medicamentos para la diabetes todos los d?as. Estos medicamentos se recetar?n solamente si son necesarios. ?Es posible que deba trabajar con un especialista en diabetes, o endocrin?logo, en un plan de tratamiento. El objetivo del tratamiento es que tenga los niveles adecuados de glucemia durante el [place]. Dichos niveles se controlan en  ayunas y despu?s de comer.  El m?dico le indicar? c?mo deben ser esos niveles. ?Siga estas instrucciones en su casa: ?Inf?rmese sobre su enfermedad ?Inf?rmese todo lo que pueda sobre su enfermedad. Preg?ntele al m?dico: ??Con qu? frecuencia debo controlar mi glucemia y d?nde obtengo el equipo? ??Qu? medicamentos para la diabetes necesito y cu?ndo debo tomarlos? ??Debo consultar con un especialista certificado en atenci?n y educaci?n sobre la diabetes? ??A qu? n?mero puedo llamar si tengo preguntas? ??D?nde puedo encontrar un grupo de apoyo para mujeres con diabetes gestacional? ?Indicaciones generales ?Use los medicamentos de venta libre y los recetados solamente como se lo haya indicado el m?dico. ?Controle el aumento de peso durante el [place]. El aumento de peso esperado depende de su [facility] (?ndice de masa corporal) antes del embarazo. ?Beba suficiente l?quido como para mantener la orina de color [place]arillo p?lido. ?Lleve con usted una tarjeta o use un brazalete o una medalla que indiquen que tiene diabetes gestacional. ?Cumpla con todas las visitas de seguimiento. Esto es importante. ?D?nde buscar m?s informaci?n ?American Diabetes Association (ADA) (Asociaci?n Estadounidense de la Diabetes): diabetes.org ?Association of Diabetes Care & Education Specialists (ADCES) (Asociaci?n de Especialistas en Atenci?n y Educaci?n sobre la Diabetes): diabeteseducator.org ?Centers for Disease Control and Prevention (Centros para el Control y la Prevenci?n de [place], CDC): StoreMirror.com.cy ?American Pregnancy Association (Asociaci?n Americana del Embarazo): americanpregnancy.org ?U.S. Department of Holiday representative (MyPlate del Departamento de Agricultura de los EE.?UU.): http://www.wilson-mendoza.org/ ?Comun?quese con un m?dico si: ?Tiene un nivel de glucemia de o por encima de: ?240?mg/dl (13.3?mmol/l). ?200 mg/dl (11.1 mmol/l) y tiene [employer]. Las cetonas son producidas por el h?gado cuando la falta de glucosa obliga al cuerpo a usar la grasa para obtener  energ?a. ?Tiene fiebre. ?Se ha sentido enferma durante 2 o m?s d?as y no mejora. ?Tiene alguno de estos problemas durante m?s de 6?horas: ?V?mitos cada vez que come o bebe. ?Diarrea. ?Solicite ayuda de inmediato si: ?Se siente confundida o no puede pensar con claridad. ?Tiene dificultad para respirar. ?Tiene un nivel moderado o alto de cetonas en la [place]. ?Siente que el beb? se mueve menos de lo habitual. ?Tiene secreciones inusuales o sangrado de la vagina. ?Comienza a tener contracciones antes de tiempo (prematuras). Las contracciones se pueden sentir como un endurecimiento de la parte inferior del abdomen. ?Tiene un dolor de cabeza intenso. ?Estos s?ntomas pueden representar un problema grave que constituye Engineer, maintenance (IT). No espere a ver si los s?ntomas desaparecen. Solicite atenci?n m?dica de inmediato. Comun?quese con el servicio de emergencias de su localidad (911 en los [place]). No conduzca por sus propios medios [employer]. ?Resumen ?La diabetes mellitus gestacional es una forma de diabetes que algunas mujeres desarrollan durante el [place]. Generalmente, sucede alrededor de la semana 24?a la 28?de gestaci?n y desaparece despu?s del parto. ?El tratamiento puede incluir seguir una dieta saludable y aumentar la actividad f?sica. Tambi?n pueden darle insulina u otros medicamentos para la diabetes. ?Comun?quese con un m?dico si sus niveles de glucosa alcanzan los 240?mg/dl (13.3?mmol/l) o m?s. ?Obtenga ayuda de inmediato si se siente confundida o no puede pensar con claridad, o si siente que el beb? se mueve menos que lo habitual. ?Esta informaci?n no tiene como fin reemplazar el consejo del m?dico. Aseg?rese de hacerle al m?dico cualquier pregunta que tenga. ?Document Revised: 08/31/2019 Document Reviewed: 08/31/2019 ?Elsevier Patient Education ? [place]. ? ?

## 2021-04-27 ENCOUNTER — Telehealth (HOSPITAL_COMMUNITY): Payer: Self-pay | Admitting: *Deleted

## 2021-04-27 ENCOUNTER — Encounter (HOSPITAL_COMMUNITY): Payer: Self-pay | Admitting: *Deleted

## 2021-04-27 ENCOUNTER — Encounter (HOSPITAL_COMMUNITY): Payer: Self-pay | Admitting: Family Medicine

## 2021-04-27 NOTE — Telephone Encounter (Signed)
Preadmission screen ?[id number] ?

## 2021-04-28 ENCOUNTER — Inpatient Hospital Stay (HOSPITAL_COMMUNITY): Payer: Medicaid Other | Admitting: Anesthesiology

## 2021-04-28 ENCOUNTER — Inpatient Hospital Stay (HOSPITAL_COMMUNITY): Payer: Medicaid Other

## 2021-04-28 ENCOUNTER — Encounter (HOSPITAL_COMMUNITY): Payer: Self-pay | Admitting: Family Medicine

## 2021-04-28 ENCOUNTER — Other Ambulatory Visit: Payer: Self-pay

## 2021-04-28 ENCOUNTER — Inpatient Hospital Stay (HOSPITAL_COMMUNITY)
Admission: AD | Admit: 2021-04-28 | Discharge: 2021-05-01 | DRG: 768 | Disposition: A | Discharge status: Home / Self Care | Payer: Medicaid Other | Attending: Family Medicine | Admitting: Family Medicine

## 2021-04-28 DIAGNOSIS — Z3A38 38 weeks gestation of pregnancy: Secondary | ICD-10-CM

## 2021-04-28 DIAGNOSIS — Z7982 Long term (current) use of aspirin: Secondary | ICD-10-CM

## 2021-04-28 DIAGNOSIS — O24425 Gestational diabetes mellitus in childbirth, controlled by oral hypoglycemic drugs: Secondary | ICD-10-CM | POA: Diagnosis present

## 2021-04-28 DIAGNOSIS — Z302 Encounter for sterilization: Secondary | ICD-10-CM | POA: Diagnosis not present

## 2021-04-28 DIAGNOSIS — O09523 Supervision of elderly multigravida, third trimester: Secondary | ICD-10-CM

## 2021-04-28 DIAGNOSIS — Z98891 History of uterine scar from previous surgery: Secondary | ICD-10-CM

## 2021-04-28 DIAGNOSIS — O24415 Gestational diabetes mellitus in pregnancy, controlled by oral hypoglycemic drugs: Secondary | ICD-10-CM

## 2021-04-28 DIAGNOSIS — O34219 Maternal care for unspecified type scar from previous cesarean delivery: Secondary | ICD-10-CM | POA: Diagnosis present

## 2021-04-28 DIAGNOSIS — Z603 Acculturation difficulty: Secondary | ICD-10-CM

## 2021-04-28 DIAGNOSIS — O403XX0 Polyhydramnios, third trimester, not applicable or unspecified: Secondary | ICD-10-CM | POA: Diagnosis present

## 2021-04-28 DIAGNOSIS — O24419 Gestational diabetes mellitus in pregnancy, unspecified control: Secondary | ICD-10-CM

## 2021-04-28 DIAGNOSIS — O24420 Gestational diabetes mellitus in childbirth, diet controlled: Secondary | ICD-10-CM | POA: Diagnosis not present

## 2021-04-28 DIAGNOSIS — O09529 Supervision of elderly multigravida, unspecified trimester: Secondary | ICD-10-CM

## 2021-04-28 DIAGNOSIS — O403XX1 Polyhydramnios, third trimester, fetus 1: Secondary | ICD-10-CM | POA: Diagnosis not present

## 2021-04-28 DIAGNOSIS — O09899 Supervision of other high risk pregnancies, unspecified trimester: Secondary | ICD-10-CM

## 2021-04-28 DIAGNOSIS — O0990 Supervision of high risk pregnancy, unspecified, unspecified trimester: Principal | ICD-10-CM

## 2021-04-28 DIAGNOSIS — D259 Leiomyoma of uterus, unspecified: Secondary | ICD-10-CM

## 2021-04-28 DIAGNOSIS — O34211 Maternal care for low transverse scar from previous cesarean delivery: Secondary | ICD-10-CM | POA: Diagnosis not present

## 2021-04-28 DIAGNOSIS — Z9079 Acquired absence of other genital organ(s): Secondary | ICD-10-CM

## 2021-04-28 DIAGNOSIS — Z3009 Encounter for other general counseling and advice on contraception: Secondary | ICD-10-CM | POA: Diagnosis present

## 2021-04-28 HISTORY — DX: Pure hypercholesterolemia, unspecified: E78.00

## 2021-04-28 HISTORY — DX: Leiomyoma of uterus, unspecified: D25.9

## 2021-04-28 LAB — GLUCOSE, CAPILLARY
Glucose-Capillary: 103 mg/dL — ABNORMAL HIGH (ref 70–99)
Glucose-Capillary: 106 mg/dL — ABNORMAL HIGH (ref 70–99)
Glucose-Capillary: 112 mg/dL — ABNORMAL HIGH (ref 70–99)
Glucose-Capillary: 112 mg/dL — ABNORMAL HIGH (ref 70–99)
Glucose-Capillary: 91 mg/dL (ref 70–99)
Glucose-Capillary: 95 mg/dL (ref 70–99)

## 2021-04-28 LAB — CBC
HCT: 35.7 % — ABNORMAL LOW (ref 36.0–46.0)
Hemoglobin: 11.4 g/dL — ABNORMAL LOW (ref 12.0–15.0)
MCH: 27 pg (ref 26.0–34.0)
MCHC: 31.9 g/dL (ref 30.0–36.0)
MCV: 84.4 fL (ref 80.0–100.0)
Platelets: 234 10*3/uL (ref 150–400)
RBC: 4.23 MIL/uL (ref 3.87–5.11)
RDW: 13.5 % (ref 11.5–15.5)
WBC: 8.5 10*3/uL (ref 4.0–10.5)
nRBC: 0 % (ref 0.0–0.2)

## 2021-04-28 LAB — TYPE AND SCREEN
ABO/RH(D): O POS
Antibody Screen: NEGATIVE

## 2021-04-28 MED ORDER — OXYTOCIN BOLUS FROM INFUSION
333.0000 mL | Freq: Once | INTRAVENOUS | Status: AC
  Administered 2021-04-29: 333 mL via INTRAVENOUS

## 2021-04-28 MED ORDER — FENTANYL-BUPIVACAINE-NACL 0.5-0.125-0.9 MG/250ML-% EP SOLN
12.0000 mL/h | EPIDURAL | Status: DC | PRN
  Administered 2021-04-28: 12 mL/h via EPIDURAL
  Filled 2021-04-28: qty 250

## 2021-04-28 MED ORDER — ACETAMINOPHEN 325 MG PO TABS
650.0000 mg | ORAL_TABLET | ORAL | Status: DC | PRN
Start: 2021-04-28 — End: 2021-04-29

## 2021-04-28 MED ORDER — DIPHENHYDRAMINE HCL 50 MG/ML IJ SOLN: 12.5000 mg | INTRAMUSCULAR | Status: DC | PRN

## 2021-04-28 MED ORDER — EPHEDRINE 5 MG/ML INJ: 10.0000 mg | INTRAVENOUS | Status: DC | PRN

## 2021-04-28 MED ORDER — LACTATED RINGERS IV SOLN: INTRAVENOUS | Status: DC

## 2021-04-28 MED ORDER — FLEET ENEMA 7-19 GM/118ML RE ENEM: 1.0000 | ENEMA | RECTAL | Status: DC | PRN

## 2021-04-28 MED ORDER — OXYTOCIN-SODIUM CHLORIDE 30-0.9 UT/500ML-% IV SOLN
1.0000 m[IU]/min | INTRAVENOUS | Status: DC
  Administered 2021-04-28: 2 m[IU]/min via INTRAVENOUS
  Filled 2021-04-28: qty 500

## 2021-04-28 MED ORDER — OXYCODONE-ACETAMINOPHEN 5-325 MG PO TABS: 1.0000 | ORAL_TABLET | ORAL | Status: DC | PRN

## 2021-04-28 MED ORDER — TERBUTALINE SULFATE 1 MG/ML IJ SOLN: 0.2500 mg | Freq: Once | INTRAMUSCULAR | Status: DC | PRN

## 2021-04-28 MED ORDER — ONDANSETRON HCL 4 MG/2ML IJ SOLN: 4.0000 mg | Freq: Four times a day (QID) | INTRAMUSCULAR | Status: DC | PRN

## 2021-04-28 MED ORDER — OXYTOCIN-SODIUM CHLORIDE 30-0.9 UT/500ML-% IV SOLN: 2.5000 [IU]/h | INTRAVENOUS | Status: DC

## 2021-04-28 MED ORDER — PHENYLEPHRINE 40 MCG/ML (10ML) SYRINGE FOR IV PUSH (FOR BLOOD PRESSURE SUPPORT): 80.0000 ug | PREFILLED_SYRINGE | INTRAVENOUS | Status: DC | PRN

## 2021-04-28 MED ORDER — LIDOCAINE HCL (PF) 1 % IJ SOLN
30.0000 mL | INTRAMUSCULAR | Status: AC | PRN
  Administered 2021-04-29: 30 mL via SUBCUTANEOUS
  Filled 2021-04-28: qty 30

## 2021-04-28 MED ORDER — PHENYLEPHRINE 40 MCG/ML (10ML) SYRINGE FOR IV PUSH (FOR BLOOD PRESSURE SUPPORT)
80.0000 ug | PREFILLED_SYRINGE | INTRAVENOUS | Status: DC | PRN
  Filled 2021-04-28: qty 10

## 2021-04-28 MED ORDER — SOD CITRATE-CITRIC ACID 500-334 MG/5ML PO SOLN: 30.0000 mL | ORAL | Status: DC | PRN

## 2021-04-28 MED ORDER — OXYTOCIN-SODIUM CHLORIDE 30-0.9 UT/500ML-% IV SOLN: 1.0000 m[IU]/min | INTRAVENOUS | Status: DC

## 2021-04-28 MED ORDER — LACTATED RINGERS IV SOLN: 500.0000 mL | Freq: Once | INTRAVENOUS | Status: DC

## 2021-04-28 MED ORDER — OXYCODONE-ACETAMINOPHEN 5-325 MG PO TABS: 2.0000 | ORAL_TABLET | ORAL | Status: DC | PRN

## 2021-04-28 MED ORDER — LACTATED RINGERS IV SOLN
500.0000 mL | INTRAVENOUS | Status: DC | PRN
  Administered 2021-04-28: 1000 mL via INTRAVENOUS

## 2021-04-28 MED ORDER — LIDOCAINE HCL (PF) 1 % IJ SOLN
INTRAMUSCULAR | Status: DC | PRN
  Administered 2021-04-28: 7 mL via EPIDURAL
  Administered 2021-04-28: 3 mL via EPIDURAL

## 2021-04-28 NOTE — Anesthesia Preprocedure Evaluation (Signed)
Anesthesia Evaluation  ?Patient identified by MRN, date of birth, ID band ?Patient awake ? ? ? ?Reviewed: ?Allergy & Precautions, H&P , NPO status , Patient's Chart, lab work & pertinent test results ? ?History of Anesthesia Complications ?Negative for: history of anesthetic complications ? ?Airway ?Mallampati: II ? ?TM Distance: >3 FB ? ? ? ? Dental ?  ?Pulmonary ?neg pulmonary ROS,  ?  ?Pulmonary exam normal ? ? ? ? ? ? ? Cardiovascular ?negative cardio ROS ? ? ?Rhythm:regular Rate:Normal ? ? ?  ?Neuro/Psych ?negative neurological ROS ? negative psych ROS  ? GI/Hepatic ?negative GI ROS, Neg liver ROS,   ?Endo/Other  ?diabetes, Gestational, Oral Hypoglycemic Agents ? Renal/GU ?negative Renal ROS  ?negative genitourinary ?  ?Musculoskeletal ? ? Abdominal ?  ?Peds ? Hematology ?negative hematology ROS ?(+)   ?Anesthesia Other Findings ? ? Reproductive/Obstetrics ?(+) Pregnancy (TOLAC, 2 prior VBAC) ? ?  ? ? ? ? ? ? ? ? ? ? ? ? ? ?  ?  ? ? ? ? ? ? ? ? ?Anesthesia Physical ?Anesthesia Plan ? ?ASA: 2 ? ?Anesthesia Plan: Epidural  ? ?Post-op Pain Management:   ? ?Induction:  ? ?PONV Risk Score and Plan:  ? ?Airway Management Planned:  ? ?Additional Equipment:  ? ?Intra-op Plan:  ? ?Post-operative Plan:  ? ?Informed Consent: I have reviewed the patients History and Physical, chart, labs and discussed the procedure including the risks, benefits and alternatives for the proposed anesthesia with the patient or authorized representative who has indicated his/her understanding and acceptance.  ? ? ? ? ? ?Plan Discussed with:  ? ?Anesthesia Plan Comments:   ? ? ? ? ? ? ?Anesthesia Quick Evaluation ? ?

## 2021-04-28 NOTE — Progress Notes (Signed)
LABOR PROGRESS NOTE ? ?Subjective: Patient is comfortable at bs with FOB present for support.  ? ?Objective: ?Blood pressure (!) 119/59, pulse 69, temperature 98.1 ?F (36.7 ?C), temperature source Oral, resp. rate 16, height '5\' 3"'$  (1.6 m), weight 86.8 kg, last menstrual period 08/01/2020, SpO2 100 %. ?Temp (24hrs), Avg:98.1 ?F (36.7 ?C), Min:98.1 ?F (36.7 ?C), Max:98.2 ?F (36.8 ?C) ? ? ?Fetal Heart Tones ?Baseline: 145 ?Variability: moderate ?Accelerations: present, 15x15 ?Decelerations: absent ?Overall assessment: cat 1 ? ?Contractions: q1-2 min ? ?Cervical Exam: 9.5/90/0 per RN ? ?ASSESSMENT AND PLAN:   ? ?Normally progressing IUP at 49w4din active labor stage. ? ?Continue current management  ? ?Anticipate SVD ? ?Electronically Signed By: ?OGreggory Keen SNM ?04/28/21 9:32 PM   ?

## 2021-04-28 NOTE — Progress Notes (Signed)
Patient ID: Vanessa Henson, female   DOB: [birth year], 36 y.o.   MRN: [id number] ? ?Pt comfortable at bs with epidural. FHTs cat 1. Pt continued pushing with good maternal efforts without significant fetal descent. Plan to stop pushing at this time and allow laboring down in high fowler's position. Will restart pushing efforts with further descent.  ?

## 2021-04-28 NOTE — Progress Notes (Signed)
Patient ID: Vanessa Henson, female   DOB: 1986-01-08, 37 y.o.   MRN: [id number] ?LABOR PROGRESS NOTE ? ?Subjective: Patient reports some increase in discomfort on RLQ. Does not reports other abnormal s/s. Some pressure noted with CTXs ? ?Objective: ?Blood pressure 136/66, pulse 70, temperature 98.9 ?F (37.2 ?C), temperature source Oral, resp. rate 18, height '5\' 3"'$  (1.6 m), weight 86.8 kg, last menstrual period 08/01/2020, SpO2 100 %. ?Temp (24hrs), Avg:98.3 ?F (36.8 ?C), Min:98.1 ?F (36.7 ?C), Max:98.9 ?F (37.2 ?C) ? ? ?Fetal Heart Tones ?Baseline: 155-160 ?Variability: moderate ?Accelerations: present, 15x15 ?Decelerations: absent ?Overall assessment: overall cat 1 ? ?Contractions: q1-3 ? ?Cervical Exam: 10/100%/+1  ? ?ASSESSMENT AND PLAN:   ? ?Actively laboring IUP at [redacted]w[redacted]d? ?Labor down in hands/knees position to encourage descent for 20-30 min as tolerated. Pt verbalized understanding and agreement with use of interpreter.  ? ?Anticipate SVD ? ?Electronically Signed By: ?OGreggory Keen SNM ?04/28/21 11:39 PM   ?

## 2021-04-28 NOTE — Progress Notes (Signed)
Patient ID: Vanessa Henson, female   DOB: Sep 17, 1985, 36 y.o.   MRN: [id number] ? ?LABOR PROGRESS NOTE ? ?Subjective: Patient reports mild pressure with CTXs, decreased discomfort in RLQ with epidural bolus. Does not report abnormal s/s at this time. ? ?Objective: ?Blood pressure 136/66, pulse 70, temperature 98.9 ?F (37.2 ?C), temperature source Oral, resp. rate 18, height '5\' 3"'$  (1.6 m), weight 86.8 kg, last menstrual period 08/01/2020, SpO2 100 %. ?Temp (24hrs), Avg:98.3 ?F (36.8 ?C), Min:98.1 ?F (36.7 ?C), Max:98.9 ?F (37.2 ?C) ? ? ?Fetal Heart Tones ?Baseline: 155 ?Variability: moderate ?Accelerations: present; 15x15 ?Decelerations: absent ?Overall assessment: cat 1 ? ?Contractions: q1.5-2.5 min ? ?Cervical Exam: 10/100%/0  ? ?ASSESSMENT AND PLAN:   ? ?Actively laboring IUP at [redacted]w[redacted]d Pushing initiated at 2155. Good maternal pushing efforts noted with significant fetal movement. Plan to continue pushing efforts. ? ?Anticipate SVD ? ?Electronically Signed By: ?OGreggory Keen SNM ?04/28/21 11:32 PM   ?

## 2021-04-28 NOTE — Anesthesia Procedure Notes (Signed)
Epidural ?Patient location during procedure: OB ? ?Staffing ?Anesthesiologist: Lidia Collum, MD ?Performed: anesthesiologist  ? ?Preanesthetic Checklist ?Completed: patient identified, IV checked, risks and benefits discussed, monitors and equipment checked, pre-op evaluation and timeout performed ? ?Epidural ?Patient position: sitting ?Prep: DuraPrep ?Patient monitoring: heart rate, continuous pulse ox and blood pressure ?Approach: midline ?Location: L3-L4 ?Injection technique: LOR air ? ?Needle:  ?Needle type: Tuohy  ?Needle gauge: 17 G ?Needle length: 9 cm ?Needle insertion depth: 6 cm ?Catheter type: closed end flexible ?Catheter size: 19 Gauge ?Catheter at skin depth: 11 cm ?Test dose: negative ? ?Assessment ?Events: blood not aspirated, injection not painful, no injection resistance, no paresthesia and negative IV test ? ?Additional Notes ?Reason for block:procedure for pain ? ? ? ?

## 2021-04-28 NOTE — H&P (Signed)
OBSTETRIC ADMISSION HISTORY AND PHYSICAL ? ?Vanessa Henson is a 36 y.o. female [phone] with IUP at 31w4dby LMP/9wk [place]ore[place]resenting for IOL in s/o GDMA2, polyhydramnios, AC>99%ile. She reports +FMs, No LOF, no VB, no blurry vision, headaches or peripheral edema, and RUQ pain.  She plans on breast feeding. She request BTS for birth control. ? ?[facility] ? ?Dating: By LMP and 9 wk [place]--->  Estimated Date of Delivery: 05/08/21 ? ?Sono:   ? ?04/19/21: '@[redacted]w[redacted]d'$ , CWD, normal anatomy, vertex presentation, 3556g, 88% EFW, polyhydramnios 31 ? ? ?Prenatal History/Complications:  ?GDMA2 - She is on Metformin. Tested to 3175g vaginally.  ?H/o CD - 2 subsequent VBAC, desires TOLAC. Desires BTS which was her only reason for interest in CD in the beginning of pregnancy. CD due to "failure to progress" in 2008.  ?AMA: NIPS wnl, anatomy wnl ? ?Past Medical History: ?Past Medical History:  ?Diagnosis Date  ? DM type 2 (diabetes [place] 2[place]  ? Resolved in 2021 with 62 lb weight loss.  ? Elevated LDL cholesterol level   ? Fibroid, uterine 2022  ? Gallstones 2014  ? Biliary sludge of pregnancy  ? Gestational diabetes 2010  ? Prediabetes 2017  ? Renal disorder 2008  ? kidney stones  ? Uterine leiomyoma   ? ? ?Past Surgical History: ?Past Surgical History:  ?Procedure Laterality Date  ? CESAREAN SECTION  2008  ? EYE SURGERY Right 2012  ? ?removal of chalazion--[place]  ? ? ?Obstetrical History: ?OB History   ? ? Gravida  ?5  ? Para  ?3  ? Term  ?2  ? Preterm  ?1  ? AB  ?1  ? Living  ?3  ?  ? ? SAB  ?1  ? IAB  ?   ? Ectopic  ?   ? Multiple  ?   ? Live Births  ?3  ?   ?  ?  ? ? ?Social History ?Social History  ? ?Socioeconomic History  ? Marital status: Married  ?  Spouse name: JRosalio Loud ? Number of children: 3  ? Years of education: 155 ? Highest education level: Not on file  ?Occupational History  ? 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 Smoking  status: Never  ? Smokeless tobacco: Never  ?Vaping Use  ? Vaping Use: Never used  ?Substance and Sexual Activity  ? Alcohol use: No  ? Drug use: No  ? Sexual activity: Yes  ?  Birth control/protection: Condom  ?Other Topics Concern  ? Not on file  ?Social History Narrative  ? [place]  ? Girls go to [employer] ? ?Social Determinants of Health  ? ?Financial Resource Strain: Not on file  ?Food Insecurity: No Food Insecurity  ? Worried About RCharity fundraiserin the Last Year: Never true  ? Ran Out of Food in the Last Year: Never true  ?Transportation Needs: No Transportation Needs  ? Lack of Transportation (Medical): No  ? Lack of Transportation (Non-Medical): No  ?Physical Activity: Not on file  ?Stress: Not on file  ?Social Connections: Not on file  ? ? ?Family History: ?Family History  ?Problem Relation Age of Onset  ? Diabetes Mother   ? Diabetes Father   ? Hypertension Father   ? Heart disease Father   ?     MI  ? Eczema Daughter   ?  Asthma Daughter   ? ? ?Allergies: ?Allergies  ?Allergen Reactions  ? Diphenhydramine Hcl Other (See Comments)  ?  Dizziness and shortness of breath.  ? ? ?Medications Prior to Admission  ?Medication Sig Dispense Refill Last Dose  ? aspirin EC 81 MG tablet Take 1 tablet (81 mg total) by mouth daily. Swallow whole. 30 tablet 11 04/27/2021  ? famotidine (PEPCID) 20 MG tablet Take 1 tablet (20 mg total) by mouth 2 (two) times daily. 60 tablet 3 Past Week  ? FOLIC ACID PO Take by mouth.   04/28/2021  ? glycopyrrolate ([name]-FORTE) 2 MG tablet Take 1 tablet (2 mg total) by mouth 3 (three) times daily. 270 tablet 2 04/28/2021  ? metFORMIN (GLUCOPHAGE) 1000 MG tablet Take 1 tablet (1,000 mg total) by mouth 2 (two) times daily with a meal. 60 tablet 5 04/28/2021  ? Prenatal Vit-Fe Fumarate-FA (MULTIVITAMIN-PRENATAL) 27-0.8 MG TABS tablet Take 1 tablet by mouth daily at 12 noon.   04/28/2021  ? Insulin Syringe-Needle U-100 (INSULIN  SYRINGE .5CC/31GX5/16") 31G X 5/16" 0.5 ML MISC 1 Syringe by Does not apply route at bedtime. 30 each 3 Unknown  ? ? ? ?Review of Systems  ? ?All systems reviewed and negative except as stated in HPI ? ?Blood pressure 123/85, pulse 75, resp. rate 17, height '5\' 3"'$  (1.6 m), weight 86.8 kg, last menstrual period 08/01/2020. ?General appearance: alert, cooperative, and no distress ?Lungs: clear to auscultation bilaterally ?Heart: regular rate and rhythm ?Abdomen: soft, non-tender; bowel sounds normal ?Pelvic: Adequate ?Extremities: Homans sign is negative, no sign of DVT ?DTR's - not assessed ?Presentation: cephalic ?Fetal monitoringBaseline: 140 bpm, Variability: Good {> 6 bpm), Accelerations: Reactive, and Decelerations: Absent ?Uterine activity: irregular ?Dilation: 4 ?Effacement (%): 80 ?Station: -3 ?Exam by:: Dr. Damita Dunnings ? ? ?Prenatal labs: ?ABO, Rh: --/--/PENDING (03/18 1119) ?Antibody: PENDING (03/18 1119) ?Rubella: 1.16 (09/14 1640) ?RPR: Non Reactive (01/04 0853)  ?HBsAg: Negative (09/14 1640)  ?HIV: Non Reactive (01/04 0853)  ?GBS: Negative/-- (03/02 1127)  ?2 hr Glucola abnormal ?Genetic screening  LR female, NIPS wnl ?Anatomy US complete/wnl ? ?Prenatal Transfer Tool  ?Maternal Diabetes: Yes:  Diabetes Type:  Insulin/Medication controlled ?Genetic Screening: Normal ?Maternal Ultrasounds/Referrals: Normal ?Fetal Ultrasounds or other Referrals:  None ?Maternal Substance Abuse:  No ?Significant Maternal Medications:  None ?Significant Maternal Lab Results: Group B Strep negative ? ?Results for orders placed or performed during the hospital encounter of 04/28/21 (from the past 24 hour(s))  ?CBC  ? Collection Time: 04/28/21 11:19 AM  ?Result Value Ref Range  ? WBC 8.5 4.0 - 10.5 K/uL  ? RBC 4.23 3.87 - 5.11 MIL/uL  ? Hemoglobin 11.4 (L) 12.0 - 15.0 g/dL  ? HCT 35.7 (L) 36.0 - 46.0 %  ? MCV 84.4 80.0 - 100.0 fL  ? MCH 27.0 26.0 - 34.0 pg  ? MCHC 31.9 30.0 - 36.0 g/dL  ? RDW 13.5 11.5 - 15.5 %  ? Platelets 234 150 -  400 K/uL  ? nRBC 0.0 0.0 - 0.2 %  ?Type and screen  ? Collection Time: 04/28/21 11:19 AM  ?Result Value Ref Range  ? ABO/RH(D) PENDING   ? Antibody Screen PENDING   ? Sample Expiration    ?  05/01/2021,2359 ?Performed at [facility], [place] [address], [place], [place] [address] ?  ? ? ?Patient Active Problem List  ? Diagnosis Date Noted  ? Gestational diabetes mellitus (GDM) 04/28/2021  ? Unwanted fertility 04/19/2021  ? GDM (gestational diabetes mellitus) 02/15/2021  ?  Supervision of high risk pregnancy, antepartum 10/10/2020  ? Fibroid, uterine 2022  ? AMA (advanced maternal age) multigravida 35+ 2022  ? History of preterm delivery, currently pregnant 2022  ? Elevated LDL cholesterol level 09/07/2019  ? Seasonal allergies 11/18/2018  ? Obesity 03/29/2016  ? Language barrier, cultural differences 05/08/2012  ? History of gestational diabetes 03/12/2012  ? History of VBAC 03/12/2012  ? ? ?Assessment/Plan:  ?Vanessa Henson is a 36 y.o. [phone] at 36w4dhere for IOL ? ?#Labor: AROM done with pt consent. Will start pit 2x2.  ?#Pain: Epidural if/when desired.  ?#FWB:  NST reactive/category 1 ?#ID:  GBS neg ?#MOF: Breast ?#MOC: Hoping for BTS. Has brought the designated amount of money with them per report. Will have primary team coordinate ?#Circ:  Desires ? ?#GDM: Accuchecks Q2 hr in labor ?#TOLAC: Consent signed in the office 02/14/21 ? ?PRadene Gunning MD  ?04/28/2021, 12:25 PM ? ? ? ?

## 2021-04-29 ENCOUNTER — Encounter (HOSPITAL_COMMUNITY): Payer: Self-pay | Admitting: Family Medicine

## 2021-04-29 DIAGNOSIS — O403XX1 Polyhydramnios, third trimester, fetus 1: Secondary | ICD-10-CM

## 2021-04-29 DIAGNOSIS — O24420 Gestational diabetes mellitus in childbirth, diet controlled: Secondary | ICD-10-CM

## 2021-04-29 DIAGNOSIS — Z3A38 38 weeks gestation of pregnancy: Secondary | ICD-10-CM

## 2021-04-29 DIAGNOSIS — O34211 Maternal care for low transverse scar from previous cesarean delivery: Secondary | ICD-10-CM

## 2021-04-29 LAB — RPR: RPR Ser Ql: NONREACTIVE

## 2021-04-29 LAB — GLUCOSE, CAPILLARY
Glucose-Capillary: 238 mg/dL — ABNORMAL HIGH (ref 70–99)
Glucose-Capillary: 179 mg/dL — ABNORMAL HIGH (ref 70–99)

## 2021-04-29 MED ORDER — ONDANSETRON HCL 4 MG PO TABS: 4.0000 mg | ORAL_TABLET | ORAL | Status: DC | PRN

## 2021-04-29 MED ORDER — IBUPROFEN 600 MG PO TABS
600.0000 mg | ORAL_TABLET | Freq: Four times a day (QID) | ORAL | Status: DC
  Administered 2021-04-29 – 2021-05-01 (×9): 600 mg via ORAL
  Filled 2021-04-29 (×9): qty 1

## 2021-04-29 MED ORDER — DIBUCAINE (PERIANAL) 1 % EX OINT: 1.0000 "application " | TOPICAL_OINTMENT | CUTANEOUS | Status: DC | PRN

## 2021-04-29 MED ORDER — METHYLERGONOVINE MALEATE 0.2 MG PO TABS: 0.2000 mg | ORAL_TABLET | ORAL | Status: DC | PRN

## 2021-04-29 MED ORDER — METHYLERGONOVINE MALEATE 0.2 MG/ML IJ SOLN: 0.2000 mg | INTRAMUSCULAR | Status: DC | PRN

## 2021-04-29 MED ORDER — SENNOSIDES-DOCUSATE SODIUM 8.6-50 MG PO TABS
2.0000 | ORAL_TABLET | Freq: Every day | ORAL | Status: DC
  Administered 2021-05-01: 2 via ORAL
  Filled 2021-04-29: qty 2

## 2021-04-29 MED ORDER — SIMETHICONE 80 MG PO CHEW: 80.0000 mg | CHEWABLE_TABLET | ORAL | Status: DC | PRN

## 2021-04-29 MED ORDER — OXYCODONE-ACETAMINOPHEN 5-325 MG PO TABS
1.0000 | ORAL_TABLET | ORAL | Status: DC | PRN
  Administered 2021-04-30: 1 via ORAL
  Filled 2021-04-29: qty 1

## 2021-04-29 MED ORDER — DOCUSATE SODIUM 100 MG PO CAPS
100.0000 mg | ORAL_CAPSULE | Freq: Two times a day (BID) | ORAL | Status: DC
  Administered 2021-04-30 – 2021-05-01 (×2): 100 mg via ORAL
  Filled 2021-04-29 (×2): qty 1

## 2021-04-29 MED ORDER — COCONUT OIL OIL: 1.0000 "application " | TOPICAL_OIL | Status: DC | PRN

## 2021-04-29 MED ORDER — TETANUS-DIPHTH-ACELL PERTUSSIS 5-2.5-18.5 LF-MCG/0.5 IM SUSY: 0.5000 mL | PREFILLED_SYRINGE | Freq: Once | INTRAMUSCULAR | Status: DC

## 2021-04-29 MED ORDER — WITCH HAZEL-GLYCERIN EX PADS: 1.0000 "application " | MEDICATED_PAD | CUTANEOUS | Status: DC | PRN

## 2021-04-29 MED ORDER — MEASLES, MUMPS & RUBELLA VAC IJ SOLR: 0.5000 mL | Freq: Once | INTRAMUSCULAR | Status: DC

## 2021-04-29 MED ORDER — MEDROXYPROGESTERONE ACETATE 150 MG/ML IM SUSP: 150.0000 mg | INTRAMUSCULAR | Status: DC | PRN

## 2021-04-29 MED ORDER — PRENATAL MULTIVITAMIN CH
1.0000 | ORAL_TABLET | Freq: Every day | ORAL | Status: DC
  Administered 2021-04-29 – 2021-05-01 (×2): 1 via ORAL
  Filled 2021-04-29 (×2): qty 1

## 2021-04-29 MED ORDER — ONDANSETRON HCL 4 MG/2ML IJ SOLN: 4.0000 mg | INTRAMUSCULAR | Status: DC | PRN

## 2021-04-29 MED ORDER — ACETAMINOPHEN 325 MG PO TABS
650.0000 mg | ORAL_TABLET | ORAL | Status: DC | PRN
  Administered 2021-04-29: 650 mg via ORAL
  Filled 2021-04-29: qty 2

## 2021-04-29 MED ORDER — FERROUS SULFATE 325 (65 FE) MG PO TABS
325.0000 mg | ORAL_TABLET | ORAL | Status: DC
  Administered 2021-04-29 – 2021-05-01 (×2): 325 mg via ORAL
  Filled 2021-04-29 (×2): qty 1

## 2021-04-29 MED ORDER — BENZOCAINE-MENTHOL 20-0.5 % EX AERO
1.0000 "application " | INHALATION_SPRAY | CUTANEOUS | Status: DC | PRN
  Administered 2021-04-29: 1 via TOPICAL
  Filled 2021-04-29: qty 56

## 2021-04-29 NOTE — Anesthesia Postprocedure Evaluation (Signed)
Anesthesia Post Note ? ?Patient: Vanessa Henson ? ?Procedure(s) Performed: AN AD HOC LABOR EPIDURAL ? ?  ? ?Patient location during evaluation: Mother Baby ?Anesthesia Type: Epidural ?Level of consciousness: awake and alert and oriented ?Pain management: satisfactory to patient ?Vital Signs Assessment: post-procedure vital signs reviewed and stable ?Respiratory status: respiratory function stable ?Cardiovascular status: stable ?Postop Assessment: no headache, no backache, epidural receding, patient able to bend at knees, no signs of nausea or vomiting, adequate PO intake and able to ambulate ?Anesthetic complications: no ? ? ?No notable events documented. ? ?Last Vitals:  ?Vitals:  ? 04/29/21 0544 04/29/21 0833  ?BP: 122/68 110/69  ?Pulse: 71 74  ?Resp: 18 18  ?Temp: 36.8 ?C 36.4 ?C  ?SpO2: 100% 100%  ?  ?Last Pain:  ?Vitals:  ? 04/29/21 0835  ?TempSrc:   ?PainSc: 0-No pain  ? ?Pain Goal:   ? ?  ?  ?  ?  ?  ?  ?  ? ?Vanessa Henson ? ? ? ? ?

## 2021-04-29 NOTE — Lactation Note (Signed)
This note was copied from a baby's chart. ?Lactation Consultation Note ? ?Patient Name: Vanessa Henson ?Today's Date: 04/29/2021 ?Reason for consult: Initial assessment;Early term 37-38.6wks ?Age:36 hours ? ?Initial visit to 65 hours old infant of a P4 mother. Mother states [name] latched well after delivery. Parents requested formula due to family hx of jaundice. Provided ~58m.  ?Reviewed normal newborn behavior during first 24h, expected output, tummy size and feeding frequency. Talked about pace bottled feeding, frequent burping and upright position.  ? ?Plan: ?1-Skin to skin, aim for a deep, comfortable latch and breastfeed on demand or 8-12 times in 24h period. ?2-Encouraged maternal rest, hydration and food intake.  ?3-If formula feeding, volume according to age, pace bottled feeding, frequent burping and upright position. ? ?Contact LC as needed for feeds/support/concerns/questions. All questions answered at this time. Provided Lactation services brochure and promoted INJoy booklet information.    ? ? ?Maternal Data ?Has patient been taught Hand Expression?: No ?Does the patient have breastfeeding experience prior to this delivery?: Yes ?How long did the patient breastfeed?: 12 monthas x 3 (youngest child is now 813y.o) ? ?Feeding ?Mother's Current Feeding Choice: Breast Milk and Formula ?Nipple Type: Extra Slow Flow ? ?Interventions ?Interventions: Breast feeding basics reviewed;Skin to skin;LC Services brochure;Pace feeding;Education;Expressed milk ? ?Discharge ?[place]: Yes ? ?Consult Status ?Consult Status: Follow-up ?Date: 04/30/21 ?Follow-up type: In-patient ? ? ? ?Danielle Mink A Higuera Ancidey ?04/29/2021, 2:06 PM ? ? ? ?

## 2021-04-29 NOTE — Lactation Note (Signed)
This note was copied from a baby's chart. ?Lactation Consultation Note ? ?Patient Name: Vanessa Henson ?Today's Date: 04/29/2021 ?Age:36 hours ? ?Attempted [place] visit, parents and baby are sleeping. [place] transferred baby to basinet.  ?LC will come back to room at another time as possible.   ? ? ?Feeding ?Nipple Type: Extra Slow Flow ? ?LATCH Score ?Latch: Grasps breast easily, tongue down, lips flanged, rhythmical sucking. ? ?Audible Swallowing: None ? ?Type of Nipple: Everted at rest and after stimulation ? ?Comfort (Breast/Nipple): Soft / non-tender ? ?Hold (Positioning): No assistance needed to correctly position infant at breast. ? ?LATCH Score: 8 ? ? ?Nyeshia Mysliwiec A Higuera Ancidey ?04/29/2021, 11:48 AM ? ? ? ?

## 2021-04-29 NOTE — Lactation Note (Signed)
This note was copied from a baby's chart. ?Lactation Consultation Note ? ?Patient Name: Vanessa Henson ?Today's Date: 04/29/2021 ?Reason for consult: L&D Initial assessment;Early term 37-38.6wks ?Age:36 hours ? ?Initial L&D Consult: ? ?Spanish interpreter, Kristopher Glee [phone]) used for interpretation.   ? ?Visited with family < 1 hour after birth ?Mother had baby latched in the cradle hold; slightly shallow latch and baby doing some intermittent grunting.  RN in room and aware; she is monitoring him.  Color pink and intermittent sucking noted.  Assisted to get a deeper latch.  Observed him on/off for 5 minutes.  He became more tired and continued with intermittent grunting.  Placed him STS on mother's chest and intermittent grunting continued; color pink.  RN to continue monitoring baby.  Reassured family that lactation services will be available on the M/B unit.  Father present. ? ? ?Maternal Data ?  ? ?Feeding ?Mother's Current Feeding Choice: Breast Milk and Formula ? ?LATCH Score ?Latch: Repeated attempts needed to sustain latch, nipple held in mouth throughout feeding, stimulation needed to elicit sucking reflex. ? ?Audible Swallowing: None ? ?Type of Nipple: Everted at rest and after stimulation ? ?Comfort (Breast/Nipple): Soft / non-tender ? ?Hold (Positioning): Assistance needed to correctly position infant at breast and maintain latch. ? ?LATCH Score: 6 ? ? ?Lactation Tools Discussed/Used ?  ? ?Interventions ?Interventions: Assisted with latch;Skin to skin ? ?Discharge ?  ? ?Consult Status ?Consult Status: Follow-up from L&D ? ? ? ?Isla Sabree R Sanika Brosious ?04/29/2021, 3:17 AM ? ? ? ?

## 2021-04-29 NOTE — Discharge Summary (Addendum)
? ?  Postpartum Discharge Summary ? ?   ?Patient Name: Vanessa Henson ?DOB: Mar 18, 1985 ?MRN: [id number] ? ?Date of admission: 04/28/2021 ?Delivery date:04/29/2021  ?Delivering provider: Florian Buff  ?Date of discharge: 05/01/2021 ? ?Admitting diagnosis: Gestational diabetes mellitus (GDM) [O24.419] ?Intrauterine pregnancy: [redacted]w[redacted]d    ?Secondary diagnosis:  Principal Problem: ?  Gestational diabetes mellitus, class A2 ?Active Problems: ?  History of VBAC ?  Language barrier, cultural differences ?  AMA (advanced maternal age) multigravida 342+?  Unwanted fertility ?  History of bilateral tubal ligation  ?  S/p Bilateral salpingectomy  ? ?Additional problems: none    ?Discharge diagnosis: Term Pregnancy Delivered, VBAC, and GDM A2                                              ?Post partum procedures: none ?Augmentation: AROM and Pitocin ?Complications: None ? ?Hospital course: Induction of Labor With Vaginal Delivery   ?36y.o. yo [id number] 36w5das admitted to the hospital 04/28/2021 for induction of labor.  Indication for induction:  polyhydramnios and elevated CBGs in the setting of GDMA2 .  Patient had a labor course remarkable for protracted descent after pushing a total of 2hrs with 2 hours of laboring down in various positions.  She had a 1.9m3mSD (see Delivery Note) with a VAVD. ?Membrane Rupture Time/Date: 12:20 PM ,04/28/2021   ?Delivery Method:VBAC, Vacuum Assisted  ?Episiotomy: None  ?Lacerations:  3rd degree  ?Details of delivery can be found in separate delivery note.  Patient had a routine postpartum course with the addition of a ppBTL on PPD#1.   Her fasting CBG on PPD#1 was 87. Patient is discharged home 05/01/21. ? ?Newborn Data: ?Birth date:04/29/2021  ?Birth time:2:21 AM  ?Gender:Female  ?Living status:Living  ?Apgars:3 ,7  ?Weight:4290 g (9lb 7.3oz) ? ?Magnesium Sulfate received: No ?BMZ received: No ?Rhophylac:N/A ?MMR:N/A ?T-DaP: declined ?Flu: No ?Transfusion:No ? ?Physical exam  ?Vitals:  ?  04/30/21 1509 04/30/21 2143 05/01/21 0222 05/01/21 [id number]: 128/78 119/65 121/72 112/65  ?Pulse: 68 75 70 66  ?Resp: 17 16 16 18   ?Temp: 98.2 ?F (36.8 ?C) 98.2 ?F (36.8 ?C) 98.2 ?F (36.8 ?C) 98.1 ?F (36.7 ?C)  ?TempSrc: Oral Oral Oral   ?SpO2:  99% 99% 99%  ?Weight:      ?Height:      ? ?General: alert, cooperative, and no distress ?Lochia: appropriate ?Uterine Fundus: firm ?Incision: Healing well with no significant drainage (from BTL)  ?DVT Evaluation: 1+ pitting calf/ankle edema bilaterally. ?Labs: ?Lab Results  ?Component Value Date  ? WBC 8.5 04/28/2021  ? HGB 11.4 (L) 04/28/2021  ? HCT 35.7 (L) 04/28/2021  ? MCV 84.4 04/28/2021  ? PLT 234 04/28/2021  ? ?CMP Latest Ref Rng & Units 10/25/2020  ?Glucose 65 - 99 mg/dL 81  ?BUN 6 - 20 mg/dL 14  ?Creatinine 0.57 - 1.00 mg/dL 0.55(L)  ?Sodium 134 - 144 mmol/L 135  ?Potassium 3.5 - 5.2 mmol/L 4.1  ?Chloride 96 - 106 mmol/L 101  ?CO2 20 - 29 mmol/L 22  ?Calcium 8.7 - 10.2 mg/dL 9.4  ?Total Protein 6.0 - 8.5 g/dL 6.9  ?Total Bilirubin 0.0 - 1.2 mg/dL <0.2  ?Alkaline Phos 44 - 121 IU/L 73  ?AST 0 - 40 IU/L 13  ?ALT 0 - 32 IU/L 14  ? ?Edinburgh Score: ?Edinburgh Postnatal Depression  Scale Screening Tool 04/30/2021  ?I have been able to laugh and see the funny side of things. 0  ?I have looked forward with enjoyment to things. 0  ?I have blamed myself unnecessarily when things went wrong. 0  ?I have been anxious or worried for no good reason. 0  ?I have felt scared or panicky for no good reason. 0  ?Things have been getting on top of me. 1  ?I have been so unhappy that I have had difficulty sleeping. 0  ?I have felt sad or miserable. 0  ?I have been so unhappy that I have been crying. 0  ?The thought of harming myself has occurred to me. 0  ?Edinburgh Postnatal Depression Scale Total 1  ? ? ? ?After visit meds:  ?Allergies as of 05/01/2021   ? ?   Reactions  ? Diphenhydramine Hcl Other (See Comments)  ? Dizziness and shortness of breath.  ? ?  ? ?  ?Medication List  ?   ? ?STOP taking these medications   ? ?aspirin EC 81 MG tablet ?  ?famotidine 20 MG tablet ?Commonly known as: PEPCID ?  ?glycopyrrolate 2 MG tablet ?Commonly known as: [name]-Forte ?  ? ?  ? ?TAKE these medications   ? ?acetaminophen 500 MG tablet ?Commonly known as: TYLENOL ?Take 2 tablets (1,000 mg total) by mouth every 6 (six) hours as needed (pain). ?  ?ferrous sulfate 325 (65 FE) MG tablet ?Take 1 tablet (325 mg total) by mouth every other day. ?Start taking on: May 03, 2021 ?  ?FOLIC ACID PO ?Take 1 tablet by mouth daily. ?  ?ibuprofen 600 MG tablet ?Commonly known as: ADVIL ?Take 1 tablet (600 mg total) by mouth every 6 (six) hours as needed. ?  ?INSULIN SYRINGE .5CC/31GX5/16" 31G X 5/16" 0.5 ML Misc ?1 Syringe by Does not apply route at bedtime. ?  ?metFORMIN 1000 MG tablet ?Commonly known as: GLUCOPHAGE ?Take 0.5 tablets (500 mg total) by mouth 2 (two) times daily with a meal. ?What changed: how much to take ?  ?multivitamin-prenatal 27-0.8 MG Tabs tablet ?Take 1 tablet by mouth daily at 12 noon. ?  ? ?  ? ? ? ?Discharge home in stable condition ?Infant Feeding: Breast ?Infant Disposition:home with mother ?Discharge instruction: per After Visit Summary and Postpartum booklet. ?Activity: Advance as tolerated. Pelvic rest for 6 weeks.  ?Diet: routine diet ?Future Appointments: ?Future Appointments  ?Date Time Provider [place]  ?06/11/2021  8:50 AM [facility]  ?06/11/2021  9:15 AM Griffin Basil, MD [facility]  ? ?Follow up Visit: ? ?Myrtis Ser, CNM  P [facility] Admin Pool ?Please schedule this patient for Postpartum visit in: 6 weeks with the following provider: Any provider  ?In-Person  ?For C/S patients schedule nurse incision check in weeks 2 weeks: no  ?High risk pregnancy complicated by: GDMA2  ?Delivery mode:  VBAC  ?Anticipated Birth Control:  BTL done PP  ?PP Procedures needed: 2 hour GTT  ?Schedule Integrated BH visit: no ? ? ?05/01/2021 ?Daniel Nones, MD ? ?GME  ATTESTATION:  ?I saw and evaluated the patient. I agree with the findings and the plan of care as documented in the resident?s note. ? ?Darrelyn Hillock, DO ?OB Fellow, Faculty Practice ?[place] [facility] ?05/01/2021 10:23 AM  ? ? ? ?

## 2021-04-30 ENCOUNTER — Inpatient Hospital Stay (HOSPITAL_COMMUNITY): Payer: Medicaid Other | Admitting: Anesthesiology

## 2021-04-30 ENCOUNTER — Encounter (HOSPITAL_COMMUNITY): Payer: Self-pay | Admitting: Family Medicine

## 2021-04-30 ENCOUNTER — Other Ambulatory Visit: Payer: Self-pay

## 2021-04-30 ENCOUNTER — Encounter (HOSPITAL_COMMUNITY)
Admission: AD | Disposition: A | Discharge status: Home / Self Care | Payer: Self-pay | Source: Home / Self Care | Attending: Family Medicine

## 2021-04-30 DIAGNOSIS — Z9079 Acquired absence of other genital organ(s): Secondary | ICD-10-CM

## 2021-04-30 DIAGNOSIS — Z302 Encounter for sterilization: Secondary | ICD-10-CM

## 2021-04-30 HISTORY — PX: TUBAL LIGATION: SHX77

## 2021-04-30 LAB — GLUCOSE, CAPILLARY: Glucose-Capillary: 87 mg/dL (ref 70–99)

## 2021-04-30 SURGERY — LIGATION, FALLOPIAN TUBE, POSTPARTUM
Anesthesia: Spinal

## 2021-04-30 MED ORDER — LIDOCAINE-EPINEPHRINE (PF) 2 %-1:200000 IJ SOLN: INTRAMUSCULAR | Status: DC | PRN

## 2021-04-30 MED ORDER — FENTANYL CITRATE (PF) 100 MCG/2ML IJ SOLN
INTRAMUSCULAR | Status: DC | PRN
  Administered 2021-04-30: 50 ug via INTRAVENOUS

## 2021-04-30 MED ORDER — FAMOTIDINE 20 MG PO TABS
40.0000 mg | ORAL_TABLET | Freq: Once | ORAL | Status: AC
  Administered 2021-04-30: 40 mg via ORAL
  Filled 2021-04-30: qty 2

## 2021-04-30 MED ORDER — MIDAZOLAM HCL 2 MG/2ML IJ SOLN
INTRAMUSCULAR | Status: AC
  Filled 2021-04-30: qty 2

## 2021-04-30 MED ORDER — LIDOCAINE-EPINEPHRINE (PF) 2 %-1:200000 IJ SOLN
INTRAMUSCULAR | Status: DC | PRN
  Administered 2021-04-30 (×2): 5 mL via EPIDURAL

## 2021-04-30 MED ORDER — METOCLOPRAMIDE HCL 10 MG PO TABS
10.0000 mg | ORAL_TABLET | Freq: Once | ORAL | Status: AC
  Administered 2021-04-30: 10 mg via ORAL
  Filled 2021-04-30: qty 1

## 2021-04-30 MED ORDER — HYDROMORPHONE HCL 1 MG/ML IJ SOLN: 0.2500 mg | INTRAMUSCULAR | Status: DC | PRN

## 2021-04-30 MED ORDER — KETOROLAC TROMETHAMINE 30 MG/ML IJ SOLN: 30.0000 mg | Freq: Once | INTRAMUSCULAR | Status: DC | PRN

## 2021-04-30 MED ORDER — OXYCODONE HCL 5 MG/5ML PO SOLN: 5.0000 mg | Freq: Once | ORAL | Status: DC | PRN

## 2021-04-30 MED ORDER — MIDAZOLAM HCL 2 MG/2ML IJ SOLN
INTRAMUSCULAR | Status: DC | PRN
  Administered 2021-04-30: 1 mg via INTRAVENOUS

## 2021-04-30 MED ORDER — LACTATED RINGERS IV SOLN
INTRAVENOUS | Status: DC
  Administered 2021-04-30: 20 mL via INTRAVENOUS

## 2021-04-30 MED ORDER — BUPIVACAINE IN DEXTROSE 0.75-8.25 % IT SOLN
INTRATHECAL | Status: DC | PRN
  Administered 2021-04-30: 10.5 mg via INTRATHECAL

## 2021-04-30 MED ORDER — LACTATED RINGERS IV SOLN: INTRAVENOUS | Status: DC | PRN

## 2021-04-30 MED ORDER — OXYCODONE HCL 5 MG PO TABS: 5.0000 mg | ORAL_TABLET | Freq: Once | ORAL | Status: DC | PRN

## 2021-04-30 MED ORDER — LIDOCAINE-EPINEPHRINE (PF) 2 %-1:200000 IJ SOLN
INTRAMUSCULAR | Status: AC
  Filled 2021-04-30: qty 20

## 2021-04-30 MED ORDER — SODIUM CHLORIDE 0.9 % IR SOLN
Status: DC | PRN
  Administered 2021-04-30: 1

## 2021-04-30 MED ORDER — FENTANYL CITRATE (PF) 100 MCG/2ML IJ SOLN
INTRAMUSCULAR | Status: AC
Start: 2021-04-30 — End: ?
  Filled 2021-04-30: qty 2

## 2021-04-30 MED ORDER — BUPIVACAINE HCL (PF) 0.25 % IJ SOLN
INTRAMUSCULAR | Status: DC | PRN
  Administered 2021-04-30: 10 mL

## 2021-04-30 MED ORDER — BUPIVACAINE HCL (PF) 0.25 % IJ SOLN
INTRAMUSCULAR | Status: AC
  Filled 2021-04-30: qty 20

## 2021-04-30 MED ORDER — ONDANSETRON HCL 4 MG/2ML IJ SOLN
INTRAMUSCULAR | Status: AC
  Filled 2021-04-30: qty 2

## 2021-04-30 MED ORDER — ONDANSETRON HCL 4 MG/2ML IJ SOLN: 4.0000 mg | Freq: Once | INTRAMUSCULAR | Status: DC | PRN

## 2021-04-30 MED ORDER — ONDANSETRON HCL 4 MG/2ML IJ SOLN
INTRAMUSCULAR | Status: DC | PRN
  Administered 2021-04-30: 4 mg via INTRAVENOUS

## 2021-04-30 SURGICAL SUPPLY — 25 items
DRSG OPSITE POSTOP 3X4 (GAUZE/BANDAGES/DRESSINGS) ×2 IMPLANT
DURAPREP 26ML APPLICATOR (WOUND CARE) ×2 IMPLANT
GLOVE SURG POLYISO LF SZ7 (GLOVE) ×2 IMPLANT
GLOVE SURG UNDER POLY LF SZ7 (GLOVE) ×4 IMPLANT
GOWN STRL REUS W/TWL LRG LVL3 (GOWN DISPOSABLE) ×2 IMPLANT
NDL SAFETY ECLIPSE 18X1.5 (NEEDLE) IMPLANT
NEEDLE HYPO 18GX1.5 SHARP (NEEDLE) ×2
NEEDLE HYPO 22GX1.5 SAFETY (NEEDLE) ×3 IMPLANT
NS IRRIG 1000ML POUR BTL (IV SOLUTION) ×2 IMPLANT
PACK ABDOMINAL MINOR (CUSTOM PROCEDURE TRAY) ×2 IMPLANT
PROTECTOR NERVE ULNAR (MISCELLANEOUS) ×2 IMPLANT
SPONGE GAUZE 2X2 8PLY STRL LF (GAUZE/BANDAGES/DRESSINGS) ×2 IMPLANT
SPONGE LAP 4X18 RFD (DISPOSABLE) ×2 IMPLANT
SUT MON AB 2-0 CT1 36 (SUTURE) ×4 IMPLANT
SUT PLAIN 0 NONE (SUTURE) IMPLANT
SUT PLAIN 2 0 (SUTURE) ×2
SUT PLAIN ABS 2-0 CT1 27XMFL (SUTURE) IMPLANT
SUT VIC AB 0 CT1 27 (SUTURE) ×2
SUT VIC AB 0 CT1 27XBRD ANBCTR (SUTURE) ×1 IMPLANT
SUT VIC AB 3-0 PS2 18 (SUTURE) ×2 IMPLANT
SUT VICRYL 0 UR6 27IN ABS (SUTURE) ×2 IMPLANT
SYR 20ML LL LF (SYRINGE) ×1 IMPLANT
SYR CONTROL 10ML LL (SYRINGE) ×3 IMPLANT
TOWEL OR 17X24 6PK STRL BLUE (TOWEL DISPOSABLE) ×2 IMPLANT
WATER STERILE IRR 1000ML POUR (IV SOLUTION) ×2 IMPLANT

## 2021-04-30 NOTE — Progress Notes (Signed)
POSTPARTUM PROGRESS NOTE ? ?Post Partum Day 1 ? ?Subjective: ? ?Vanessa Henson is a 36 y.o. [phone] s/p VBAC at 37w5dafter IOL for polyhydramnios.  She reports she is doing well. No acute events overnight. She denies any problems with ambulating, voiding or po intake. Denies nausea or vomiting.  Pain is well controlled.  Lochia is similar to normal period. ? ?Patient has been NPO for BTL today. Patient paying in cash for BTL. ? ?Objective: ?Blood pressure 118/76, pulse 76, temperature 97.9 ?F (36.6 ?C), temperature source Oral, resp. rate 18, height '5\' 3"'$  (1.6 m), weight 86.8 kg, last menstrual period 08/01/2020, SpO2 99 %, unknown if currently breastfeeding. ? ?Physical Exam:  ?General: alert, cooperative and no distress ?Chest: no respiratory distress ?Heart:regular rate, distal pulses intact ?Uterine Fundus: firm, appropriately tender ?DVT Evaluation: No calf swelling or tenderness ?Extremities: no lower extremity edema ?Skin: warm, dry ? ?Recent Labs  ?  04/28/21 ?1119  ?HGB 11.4*  ?HCT 35.7*  ? ? ?Assessment/Plan: ?Vanessa Zertucheis a 36y.o. [phone]/p VBAC at 351w5dfter IOL for polyhydramnios.  ? ?PPD#1 - Doing well ? Routine postpartum care ? ?Contraception: BTL ?Feeding: Breast ?Dispo: Plan for discharge consider DC today after BTL. ? ?#A2GDM: AM CBG 87. ? - Repeat GTT postpartum ? ? LOS: 2 days  ? ?AdDaniel NonesMD ?FM PGY-1 ?04/30/2021, 7:48 AM  ?

## 2021-04-30 NOTE — Progress Notes (Addendum)
Patient desires permanent sterilization.  Other reversible forms of contraception were discussed with patient; she declines all other modalities. Risks of procedure discussed with patient including but not limited to: risk of regret, permanence of method, bleeding, infection, injury to surrounding organs and need for additional procedures.  Failure risk of about 1% with increased risk of ectopic gestation if pregnancy occurs was also discussed with patient. The patient concurred with the proposed plan, giving informed written consent for the procedures.  Patient has been NPO since last night she will remain NPO for procedure. Anesthesia and OR aware. ? ?Still having great difficulty getting someone on the phone from [facility] financial to allow patient and her partner to make a deposit so that we can proceed. AC notified of problems and she will assist.  ? ?9:51 AM ?04/30/21 ?Clarnce Flock, MD/MPH ?Attending Family Medicine Physician, Faculty Practice ?Center for [place] ? ? ? ?AC able to discuss with financial office and I also secure chatted with Mellody Dance regarding process. [name] reports as long as she puts down a $2,500 deposit we can proceed, and financial office reports that since she is self pay she automatically qualifies for 60% discount on services. Will proceed with BTL. ? ?11:09 AM ?04/30/21 ?Clarnce Flock, MD/MPH ?Attending Family Medicine Physician, Faculty Practice ?Center for [place] ? ?

## 2021-04-30 NOTE — Anesthesia Postprocedure Evaluation (Signed)
Anesthesia Post Note ? ?Patient: Vanessa Henson ? ?Procedure(s) Performed: POST PARTUM TUBAL LIGATION ? ?  ? ?Patient location during evaluation: PACU ?Anesthesia Type: Spinal ?Level of consciousness: oriented and awake and alert ?Pain management: pain level controlled ?Vital Signs Assessment: post-procedure vital signs reviewed and stable ?Respiratory status: spontaneous breathing, respiratory function stable and patient connected to nasal cannula oxygen ?Cardiovascular status: blood pressure returned to baseline and stable ?Postop Assessment: no headache, no backache and no apparent nausea or vomiting ?Anesthetic complications: no ? ? ?No notable events documented. ? ?Last Vitals:  ?Vitals:  ? 04/30/21 1358 04/30/21 1509  ?BP: 131/74 128/78  ?Pulse: 69 68  ?Resp: 18 17  ?Temp:  36.8 ?C  ?SpO2: 100%   ?  ?Last Pain:  ?Vitals:  ? 04/30/21 1510  ?TempSrc:   ?PainSc: 7   ? ?Pain Goal:   ? ?  ?  ?  ?  ?  ?  ?  ? ?Barnet Glasgow ? ? ? ? ?

## 2021-04-30 NOTE — Anesthesia Preprocedure Evaluation (Addendum)
Anesthesia Evaluation  ?Patient identified by MRN, date of birth, ID band ?Patient awake ? ? ? ?Reviewed: ?Allergy & Precautions, NPO status , Patient's Chart, lab work & pertinent test results ? ?Airway ?Mallampati: II ? ?TM Distance: >3 FB ?Neck ROM: Full ? ? ? Dental ?no notable dental hx. ?(+) Teeth Intact, Dental Advisory Given ?  ?Pulmonary ?neg pulmonary ROS,  ?  ?Pulmonary exam normal ?breath sounds clear to auscultation ? ? ? ? ? ? Cardiovascular ?Exercise Tolerance: Good ?negative cardio ROS ?Normal cardiovascular exam ?Rhythm:Regular Rate:Normal ? ? ?  ?Neuro/Psych ?negative neurological ROS ?   ? GI/Hepatic ?negative GI ROS, Neg liver ROS,   ?Endo/Other  ?diabetes, Type 2, Oral Hypoglycemic Agents ? Renal/GU ?  ? ?  ?Musculoskeletal ?negative musculoskeletal ROS ?(+)  ? Abdominal ?  ?Peds ? Hematology ?Lab Results ?     Component                Value               Date                   ?     HGB                      11.4 (L)            04/28/2021           ?     HCT                      35.7 (L)            04/28/2021          ?     PLT                      234                 04/28/2021           ?   ?Anesthesia Other Findings ?All: Benadryl ? Reproductive/Obstetrics ? ?  ? ? ? ? ? ? ? ? ? ? ? ? ? ?  ?  ? ? ? ? ? ? ? ?Anesthesia Physical ?Anesthesia Plan ? ?ASA: 2 ? ?Anesthesia Plan: Spinal  ? ?Post-op Pain Management: Minimal or no pain anticipated  ? ?Induction:  ? ?PONV Risk Score and Plan: 3 and Treatment may vary due to age or medical condition ? ?Airway Management Planned: Nasal Cannula and Natural Airway ? ?Additional Equipment: None ? ?Intra-op Plan:  ? ?Post-operative Plan:  ? ?Informed Consent: I have reviewed the patients History and Physical, chart, labs and discussed the procedure including the risks, benefits and alternatives for the proposed anesthesia with the patient or authorized representative who has indicated his/her understanding and acceptance.   ? ? ? ?Dental advisory given and Interpreter used for interveiw ? ?Plan Discussed with:  ? ?Anesthesia Plan Comments: (Video translator used Attempt to use insitu epidural unsuceesful > 30hr post delivery switch to spinal)  ? ? ? ? ?Anesthesia Quick Evaluation ? ?

## 2021-04-30 NOTE — Transfer of Care (Signed)
Immediate Anesthesia Transfer of Care Note ? ?Patient: Vanessa Henson ? ?Procedure(s) Performed: POST PARTUM TUBAL LIGATION ? ?Patient Location: PACU ? ?Anesthesia Type:Spinal ? ?Level of Consciousness: awake ? ?Airway & Oxygen Therapy: Patient Spontanous Breathing ? ?Post-op Assessment: Report given to RN ? ?Post vital signs: Reviewed and stable ? ?Last Vitals:  ?Vitals Value Taken Time  ?BP 101/68 04/30/21 1234  ?Temp    ?Pulse 85 04/30/21 1237  ?Resp 13 04/30/21 1237  ?SpO2 100 % 04/30/21 1237  ?Vitals shown include unvalidated device data. ? ?Last Pain:  ?Vitals:  ? 04/30/21 1049  ?TempSrc:   ?PainSc: 0-No pain  ?   ? ?  ? ?Complications: No notable events documented. ?

## 2021-04-30 NOTE — Op Note (Signed)
Postpartum Tubal Ligation Operative Note  ? ?Patient: Vanessa Henson ? ?Date of Procedure: 04/30/2021 ? ?Procedure: Postpartum bilateral Tubal Ligation via Bilateral salpingectomy  ? ?Indications: undesired fertility ? ?Pre-operative Diagnosis: bilateral tubal ligation; desires permanent sterilization.  ? ?Post-operative Diagnosis: Same and Bilateral Tubal Sterilization via Bilateral salpingectomy ? ?Surgeon: Juliann Mule) and Role: ?   * Dione Plover, Annice Needy, MD - Primary ? ?Assistants: Laury Deep, CNM ? ?An experienced assistant was required given the standard of surgical care given the complexity of the case.  This assistant was needed for exposure, dissection, suctioning, retraction, instrument exchange, assisting with delivery with administration of fundal pressure, and for overall help during the procedure.  ? ?Anesthesia: spinal ? ?Anesthesiologist: Barnet Glasgow, MD  ? ?Antibiotics: None ?  ?Estimated Blood Loss: 10 ml  ? ?Total IV Fluids: 1300 ml ? ?Urine Output:  straight cath to be done after procedure ? ?Specimens: bilateral tubes sent to pathology  ? ?Complications: no complications  ? ?Indications: ?Vanessa Henson is a 36 y.o. [phone] with undesired fertility, status post vaginal delivery, desires permanent sterilization.  Other reversible forms of contraception were discussed with patient; she declines all other modalities. Risks of procedure discussed with patient including but not limited to: risk of regret, permanence of method, bleeding, infection, injury to surrounding organs and need for additional procedures.  Failure risk of 1-2 % with increased risk of ectopic gestation if pregnancy occurs and possibility of post-tubal pain syndrome also discussed with patient. Clinical course notable for uncomplicated VBAC. Patient was self pay and paid an initial deposit to [facility] prior to proceeding with the procedure.  ? ?Findings: Normal uterus, tubes, and ovaries. ? ?Procedure  Details: The patient was taken to the operating room where epidural anesthesia was attempted but was not successful due to age of catheter; spinal anesthesia was then placed and found to be adequate.  She was then placed in the dorsal supine position and prepped and draped in sterile fashion.  After an adequate timeout was performed, attention was turned to the patient's abdomen where a small transverse skin incision was made under the umbilical fold. The incision was taken down to the layer of fascia bluntly, and fascia was incised with Mayo scissors, and the incision was extended bilaterally. The peritoneum was entered in a blunt fashion.  ? ?Attention was then turned to the fallopian tubes.  ? ?A Kelly clamp was placed across the right fallopian tube taking care to incorporate the fimbriae. A second clamp was then placed below the first. The fallopian tube was then removed with Metzenbaum scissors. The pedicle was then ligated with 2-0 Monocryl suture and the second clamp was removed with excellent hemostasis noted. Then a second ligature of 0 Monocryl suture was placed below the remaining clamp, the clamp was then removed and again excellent hemostasis was observed. The same procedure was then carried out on the right fallopian tube, however with removal of the first clamp the pedicle was noted to have slipped and began to having small amount of bleeding. The Claiborne Billings was re-applied, and a fore and aft stitch of 2-0 monocryl was then placed. Subsequently a free tie of Monocyrl was placed below this knot. The area was observed to ensure no bleeding was noted, at which point excellent hemostasis was noted. ? ?All laps and instruments were then removed from the patient's abdomen and the fascial incision was repaired with 0 Vicryl, the subcutaneous tissue was closed with 2-0 plain gut, and the skin  was closed with a 4-0 Vicryl subcuticular stitch. The patient tolerated the procedure well.  Instrument, sponge, and  needle counts were correct times three.  The patient was then taken to the recovery room awake and in stable condition. ? ?Disposition: PACU - hemodynamically stable.  ? ? ?Signed: ?Clarnce Flock, MD/MPH ?Attending Family Medicine Physician, Faculty Practice ?Center for [place] ? ?

## 2021-04-30 NOTE — Lactation Note (Signed)
This note was copied from a baby's chart. ?Lactation Consultation Note ? ?Patient Name: Vanessa Henson ?Today's Date: 04/30/2021 ?Reason for consult: Follow-up assessment;Early term 37-38.6wks ?Age:36 hours ? ? ?P4 mother whose infant is now 21 hours old.  This is an ETI at 38+5 weeks.  Mother's current feeding preference is breast/formula.  She breast fed her other three children (now 61, 92 and 53 years old) for one year each. ? ?Family declines interpreter; father speaks [place] well and translates if mother does not understand. ? ?Mother had no questions/concerns related to breast feeding.  "[name]" has been latching/feeding well.  Per father, all the other children received phototherapy.  Parents are choosing to supplement with formula to hopefully avoid phototherapy with "[name]."  Encouraged mother to always breast feed first and supplement after feeding.  Volume guidelines reviewed and suggested 15 mls/feeding session today.  Father stated that "[name]" wants to "eat all the time."  Observed father feeding formula and "[name]" was content after 15 mls. ? ?Mother requested a manual pump.  Provided a pump with #24 flange size which is appropriate at this time.  Discussed stimulation prn and demonstrated finger feeding for any colostrum obtained.  Parents will call as needed for questions or latch assistance. ? ? ?Maternal Data ?Has patient been taught Hand Expression?: Yes ?Does the patient have breastfeeding experience prior to this delivery?: Yes ?How long did the patient breastfeed?: 12 months with each of her other three children ? ?Feeding ?Mother's Current Feeding Choice: Breast Milk and Formula ? ?LATCH Score ?  ? ?  ? ?  ? ?  ? ?  ? ?  ? ? ?Lactation Tools Discussed/Used ?Tools: Pump ?Breast pump type: Manual ?Pump Education: Setup, frequency, and cleaning ?Reason for Pumping: Mother's request ?Pumping frequency: PRN ? ?Interventions ?Interventions: Breast feeding basics  reviewed;Education ? ?Discharge ?Pump: Manual ?WIC Program: Yes ? ?Consult Status ?Consult Status: Follow-up ?Date: 05/01/21 ?Follow-up type: In-patient ? ? ? ?Sherlie Boyum R [place] ?04/30/2021, 8:54 AM ? ? ? ?

## 2021-05-01 ENCOUNTER — Other Ambulatory Visit (HOSPITAL_COMMUNITY): Payer: Self-pay

## 2021-05-01 ENCOUNTER — Ambulatory Visit: Payer: Self-pay

## 2021-05-01 MED ORDER — OXYCODONE HCL 5 MG PO TABS
5.0000 mg | ORAL_TABLET | Freq: Three times a day (TID) | ORAL | 0 refills | Status: AC | PRN
  Filled 2021-05-01: qty 5, 2d supply, fill #0

## 2021-05-01 MED ORDER — IBUPROFEN 600 MG PO TABS
600.0000 mg | ORAL_TABLET | Freq: Four times a day (QID) | ORAL | 0 refills | Status: AC | PRN
  Filled 2021-05-01: qty 30, 8d supply, fill #0

## 2021-05-01 MED ORDER — IBUPROFEN 600 MG PO TABS: 600.0000 mg | ORAL_TABLET | Freq: Four times a day (QID) | ORAL | 0 refills | Status: DC | PRN

## 2021-05-01 MED ORDER — METFORMIN HCL 1000 MG PO TABS: 500.0000 mg | ORAL_TABLET | Freq: Two times a day (BID) | ORAL | 5 refills | Status: DC

## 2021-05-01 MED ORDER — FERROUS SULFATE 325 (65 FE) MG PO TABS: 325.0000 mg | ORAL_TABLET | ORAL | 3 refills | Status: AC

## 2021-05-01 MED ORDER — ACETAMINOPHEN 500 MG PO TABS: 1000.0000 mg | ORAL_TABLET | Freq: Four times a day (QID) | ORAL | 0 refills | Status: AC | PRN

## 2021-05-01 MED ORDER — METFORMIN HCL 1000 MG PO TABS: 1000.0000 mg | ORAL_TABLET | Freq: Two times a day (BID) | ORAL | 5 refills | Status: AC

## 2021-05-01 NOTE — Lactation Note (Addendum)
This note was copied from a baby's chart. ?Lactation Consultation Note ? ?Patient Name: Vanessa Henson ?Today's Date: 05/01/2021 ?Reason for consult: Follow-up assessment;Early term 37-38.6wks;Hyperbilirubinemia ?Age:36 hours ? ?LC in to visit with P4 Mom of ET infant.  Baby at 5% weight loss.  Baby was started on double phototherapy this am.   ? ?Baby sleeping on FOB's lap on bili blanket and lamp.   ? ?Mom reports baby has been latching well, but Mom notices baby has been sleepy.  Baby has been supplemented with formula by bottle. ? ?LC recommended adding some double pumping to support her milk supply.  Reviewed breast massage and hand expression, LC unable to express any colostrum at present. ? ?[place] set up DEBP and assisted Mom with first pumping using 24 mm flanges.  FOB educated on importance of disassembling pump parts, washing, rinsing and air drying on paper towel provided. ? ?Plan- ?1- Breastfeed baby with feeding cues or >8 times per 24 hrs ?2- Pump both breasts 15 mins on initiation setting ?3- Offer EBM+/formula by paced bottle ? ?Encouraged Mom to ask for help prn. ? ?Lactation Tools Discussed/Used ?Tools: Pump;Flanges;Bottle ?Flange Size: 24 ?Breast pump type: Double-Electric Breast Pump;Manual ?Pump Education: Setup, frequency, and cleaning ?Reason for Pumping: Support milk supply/hyperbilirubinemia ?Pumping frequency: Encouraged Mom to pump after breastfeeding ?Pumped volume: 0 mL ? ?Interventions ?Interventions: Breast feeding basics reviewed;Skin to skin;Breast massage;Hand express;DEBP;Education ? ?Discharge ?Pump: Manual ? ?Consult Status ?Consult Status: Follow-up ?Date: 05/02/21 ?Follow-up type: In-patient ? ? ? ?Tilda Burrow E ?05/01/2021, 2:55 PM ? ? ? ?

## 2021-05-02 LAB — SURGICAL PATHOLOGY

## 2021-05-09 ENCOUNTER — Telehealth (HOSPITAL_COMMUNITY): Payer: Self-pay | Admitting: *Deleted

## 2021-05-09 NOTE — Telephone Encounter (Signed)
Attempted Hospital Discharge Follow-Up Call with help of telephonic interpreter "Mardene Celeste [phone]".  No answer. ?

## 2021-06-11 ENCOUNTER — Ambulatory Visit (INDEPENDENT_AMBULATORY_CARE_PROVIDER_SITE_OTHER): Payer: No Typology Code available for payment source | Admitting: Obstetrics and Gynecology

## 2021-06-11 ENCOUNTER — Ambulatory Visit: Payer: No Typology Code available for payment source

## 2021-06-11 ENCOUNTER — Other Ambulatory Visit: Payer: No Typology Code available for payment source

## 2021-06-11 DIAGNOSIS — O24419 Gestational diabetes mellitus in pregnancy, unspecified control: Secondary | ICD-10-CM

## 2021-06-11 NOTE — Progress Notes (Signed)
? ? ?Post Partum Visit Note ? ?Vanessa Henson is a 36 y.o. [phone] female who presents for a postpartum visit. She is 6 weeks 1 day postpartum following a vacuum-assisted vaginal delivery.  I have fully reviewed the prenatal and intrapartum course. The delivery was at 38w 5d.  Anesthesia: none. Postpartum course has been uncomplicated. Baby is doing well. Baby is feeding by both breast and bottle - Enfamil . Bleeding no bleeding. Bowel function is normal. Bladder function is normal. Patient is not sexually active. Contraception method is tubal ligation. Postpartum depression screening: negative. ? ? ?The pregnancy intention screening data noted above was reviewed. Potential methods of contraception were discussed. The patient elected to proceed with No data recorded. ? ? Edinburgh Postnatal Depression Scale - 06/11/21 0925   ? ?  ? Edinburgh Postnatal Depression Scale:  In the Past 7 Days  ? I have been able to laugh and see the funny side of things. 0   ? I have looked forward with enjoyment to things. 0   ? I have blamed myself unnecessarily when things went wrong. 0   ? I have been anxious or worried for no good reason. 0   ? I have felt scared or panicky for no good reason. 0   ? Things have been getting on top of me. 0   ? I have been so unhappy that I have had difficulty sleeping. 0   ? I have felt sad or miserable. 0   ? I have been so unhappy that I have been crying. 0   ? The thought of harming myself has occurred to me. 0   ? Edinburgh Postnatal Depression Scale Total 0   ? ?  ?  ? ?  ? ? ?Health Maintenance Due  ?Topic Date Due  ? COVID-19 Vaccine (1) Never done  ? FOOT EXAM  Never done  ? OPHTHALMOLOGY EXAM  Never done  ? URINE MICROALBUMIN  Never done  ? HEMOGLOBIN A1C  04/24/2021  ? ? ?The following portions of the patient's history were reviewed and updated as appropriate: allergies, current medications, past family history, past medical history, past social history, past surgical history, and  problem list. ? ?Review of Systems ?Pertinent items are noted in HPI. ? ?Objective:  ?BP 102/61   Pulse 63   Wt 72.1 kg   LMP 08/01/2020 (Exact Date)   Breastfeeding Yes   BMI 28.17 kg/m?   ? ?General:  alert, cooperative, and no distress  ? Breasts:  not indicated  ?Lungs: clear to auscultation bilaterally  ?Heart:  regular rate and rhythm  ?Abdomen: soft, non-tender; bowel sounds normal; no masses,  no organomegaly and PPBTL incision well healed    ?Wound well approximated incision  ?GU exam:   Third degree tear well healed, no granulation tissue, no pain on exam internally or externally  ?     ?Assessment:  ? ? Normal  postpartum exam.  ? ?Plan:  ? ?Essential components of care per ACOG recommendations: ? ?1.  Mood and well being: Patient with negative depression screening today. Reviewed local resources for support.  ?- Patient tobacco use? No.   ?- hx of drug use? No.   ? ?2. Infant care and feeding:  ?-Patient currently breastmilk feeding? Yes. Reviewed importance of draining breast regularly to support lactation.  ?-Social determinants of health (SDOH) reviewed in EPIC. No concerns. ? ?3. Sexuality, contraception and birth spacing ?- Patient does not want a pregnancy in the next  year.  Desired family size is 4 children.  ?- Reviewed reproductive life planning. Reviewed contraceptive methods based on pt preferences and effectiveness.  Patient desired Female Sterilization today.   ?- Discussed birth spacing of 18 months ? ?4. Sleep and fatigue ?-Encouraged family/partner/community support of 4 hrs of uninterrupted sleep to help with mood and fatigue ? ?5. Physical Recovery  ?- Discussed patients delivery and complications. She describes her labor as mixed. ?- Patient had a Vaginal problems after delivery including third degree tear . Patient had a 3rd degree laceration. Perineal healing reviewed. Patient expressed understanding ?- Patient has urinary incontinence? No. ?- Patient is safe to resume physical  and sexual activity ? ?6.  Health Maintenance ?- HM due items addressed Yes ?- Last pap smear 11/13/18: normalPap smear not done at today's visit.  ?-Breast Cancer screening indicated? No.  ? ?7. Chronic Disease/Pregnancy Condition follow up: Gestational Diabetes ?2 hour GTT today ?- PCP follow up ? ?Griffin Basil, MD ?Center for [facility], [place] ? ?

## 2021-06-12 LAB — GLUCOSE TOLERANCE, 2 HOURS
Glucose, 2 hour: 93 mg/dL (ref 70–139)
Glucose, GTT - Fasting: 83 mg/dL (ref 70–99)

## 2021-11-21 ENCOUNTER — Ambulatory Visit: Payer: Self-pay | Admitting: Internal Medicine

## 2022-01-15 ENCOUNTER — Ambulatory Visit: Payer: Self-pay | Admitting: Internal Medicine
